# Patient Record
Sex: Female | Born: 1950 | Race: White | Hispanic: No | Marital: Married | Smoking: Current every day smoker
Health system: Southern US, Community
[De-identification: ages and names within clinical notes are randomized; demographics above are authoritative.]

## PROBLEM LIST (undated history)

## (undated) DIAGNOSIS — K589 Irritable bowel syndrome without diarrhea: Secondary | ICD-10-CM

## (undated) DIAGNOSIS — K559 Vascular disorder of intestine, unspecified: Secondary | ICD-10-CM

## (undated) DIAGNOSIS — T4145XA Adverse effect of unspecified anesthetic, initial encounter: Secondary | ICD-10-CM

## (undated) DIAGNOSIS — K222 Esophageal obstruction: Secondary | ICD-10-CM

## (undated) DIAGNOSIS — N912 Amenorrhea, unspecified: Secondary | ICD-10-CM

## (undated) DIAGNOSIS — I219 Acute myocardial infarction, unspecified: Secondary | ICD-10-CM

## (undated) DIAGNOSIS — K635 Polyp of colon: Secondary | ICD-10-CM

## (undated) DIAGNOSIS — M549 Dorsalgia, unspecified: Secondary | ICD-10-CM

## (undated) DIAGNOSIS — I2510 Atherosclerotic heart disease of native coronary artery without angina pectoris: Secondary | ICD-10-CM

## (undated) DIAGNOSIS — F32A Depression, unspecified: Secondary | ICD-10-CM

## (undated) DIAGNOSIS — K449 Diaphragmatic hernia without obstruction or gangrene: Secondary | ICD-10-CM

## (undated) DIAGNOSIS — G8929 Other chronic pain: Secondary | ICD-10-CM

## (undated) DIAGNOSIS — K219 Gastro-esophageal reflux disease without esophagitis: Secondary | ICD-10-CM

## (undated) DIAGNOSIS — M5430 Sciatica, unspecified side: Secondary | ICD-10-CM

## (undated) DIAGNOSIS — I471 Supraventricular tachycardia, unspecified: Secondary | ICD-10-CM

## (undated) DIAGNOSIS — F329 Major depressive disorder, single episode, unspecified: Secondary | ICD-10-CM

## (undated) DIAGNOSIS — R112 Nausea with vomiting, unspecified: Secondary | ICD-10-CM

## (undated) DIAGNOSIS — T7840XA Allergy, unspecified, initial encounter: Secondary | ICD-10-CM

## (undated) DIAGNOSIS — J449 Chronic obstructive pulmonary disease, unspecified: Secondary | ICD-10-CM

## (undated) DIAGNOSIS — T8859XA Other complications of anesthesia, initial encounter: Secondary | ICD-10-CM

## (undated) DIAGNOSIS — G629 Polyneuropathy, unspecified: Secondary | ICD-10-CM

## (undated) DIAGNOSIS — N189 Chronic kidney disease, unspecified: Secondary | ICD-10-CM

## (undated) DIAGNOSIS — N390 Urinary tract infection, site not specified: Secondary | ICD-10-CM

## (undated) DIAGNOSIS — M1990 Unspecified osteoarthritis, unspecified site: Secondary | ICD-10-CM

## (undated) DIAGNOSIS — Z9889 Other specified postprocedural states: Secondary | ICD-10-CM

## (undated) DIAGNOSIS — I639 Cerebral infarction, unspecified: Secondary | ICD-10-CM

## (undated) DIAGNOSIS — M797 Fibromyalgia: Secondary | ICD-10-CM

## (undated) DIAGNOSIS — E785 Hyperlipidemia, unspecified: Secondary | ICD-10-CM

## (undated) DIAGNOSIS — J189 Pneumonia, unspecified organism: Secondary | ICD-10-CM

## (undated) DIAGNOSIS — F419 Anxiety disorder, unspecified: Secondary | ICD-10-CM

## (undated) DIAGNOSIS — D3705 Neoplasm of uncertain behavior of pharynx: Secondary | ICD-10-CM

## (undated) DIAGNOSIS — I10 Essential (primary) hypertension: Secondary | ICD-10-CM

## (undated) HISTORY — PX: BACK SURGERY: SHX140

## (undated) HISTORY — DX: Anxiety disorder, unspecified: F41.9

## (undated) HISTORY — DX: Essential (primary) hypertension: I10

## (undated) HISTORY — DX: Depression, unspecified: F32.A

## (undated) HISTORY — DX: Irritable bowel syndrome, unspecified: K58.9

## (undated) HISTORY — DX: Neoplasm of uncertain behavior of pharynx: D37.05

## (undated) HISTORY — DX: Major depressive disorder, single episode, unspecified: F32.9

## (undated) HISTORY — DX: Acute myocardial infarction, unspecified: I21.9

## (undated) HISTORY — DX: Pneumonia, unspecified organism: J18.9

## (undated) HISTORY — DX: Vascular disorder of intestine, unspecified: K55.9

## (undated) HISTORY — PX: DILATION AND CURETTAGE OF UTERUS: SHX78

## (undated) HISTORY — PX: BREAST SURGERY: SHX581

## (undated) HISTORY — DX: Atherosclerotic heart disease of native coronary artery without angina pectoris: I25.10

## (undated) HISTORY — DX: Cerebral infarction, unspecified: I63.9

## (undated) HISTORY — DX: Chronic obstructive pulmonary disease, unspecified: J44.9

## (undated) HISTORY — DX: Diaphragmatic hernia without obstruction or gangrene: K44.9

## (undated) HISTORY — DX: Fibromyalgia: M79.7

## (undated) HISTORY — DX: Chronic kidney disease, unspecified: N18.9

## (undated) HISTORY — DX: Polyp of colon: K63.5

## (undated) HISTORY — DX: Esophageal obstruction: K22.2

## (undated) HISTORY — DX: Gastro-esophageal reflux disease without esophagitis: K21.9

## (undated) HISTORY — PX: OTHER SURGICAL HISTORY: SHX169

## (undated) HISTORY — PX: BLADDER SURGERY: SHX569

## (undated) HISTORY — DX: Allergy, unspecified, initial encounter: T78.40XA

## (undated) HISTORY — DX: Amenorrhea, unspecified: N91.2

## (undated) HISTORY — DX: Urinary tract infection, site not specified: N39.0

## (undated) HISTORY — PX: TUBAL LIGATION: SHX77

## (undated) HISTORY — PX: NECK SURGERY: SHX720

## (undated) HISTORY — DX: Unspecified osteoarthritis, unspecified site: M19.90

## (undated) HISTORY — PX: ABDOMINAL HYSTERECTOMY: SHX81

## (undated) HISTORY — DX: Hyperlipidemia, unspecified: E78.5

---

## 2001-06-05 ENCOUNTER — Encounter: Admission: RE | Admit: 2001-06-05 | Discharge: 2001-07-09 | Payer: Self-pay | Admitting: Family Medicine

## 2002-11-05 ENCOUNTER — Other Ambulatory Visit: Admission: RE | Admit: 2002-11-05 | Discharge: 2002-11-05 | Payer: Self-pay | Admitting: Family Medicine

## 2004-04-13 ENCOUNTER — Ambulatory Visit (HOSPITAL_COMMUNITY): Admission: RE | Admit: 2004-04-13 | Discharge: 2004-04-13 | Payer: Self-pay | Admitting: Family Medicine

## 2005-03-05 ENCOUNTER — Emergency Department (HOSPITAL_COMMUNITY): Admission: EM | Admit: 2005-03-05 | Discharge: 2005-03-05 | Payer: Self-pay | Admitting: Emergency Medicine

## 2005-09-10 HISTORY — PX: COLONOSCOPY: SHX174

## 2006-02-12 ENCOUNTER — Encounter (INDEPENDENT_AMBULATORY_CARE_PROVIDER_SITE_OTHER): Payer: Self-pay | Admitting: Family Medicine

## 2006-11-08 ENCOUNTER — Inpatient Hospital Stay (HOSPITAL_COMMUNITY): Admission: EM | Admit: 2006-11-08 | Discharge: 2006-11-11 | Payer: Self-pay | Admitting: Emergency Medicine

## 2006-11-09 ENCOUNTER — Encounter: Payer: Self-pay | Admitting: Internal Medicine

## 2006-11-09 ENCOUNTER — Ambulatory Visit: Payer: Self-pay | Admitting: Internal Medicine

## 2006-11-10 ENCOUNTER — Ambulatory Visit: Payer: Self-pay | Admitting: Gastroenterology

## 2006-12-04 ENCOUNTER — Ambulatory Visit: Payer: Self-pay | Admitting: Internal Medicine

## 2007-01-14 ENCOUNTER — Encounter (INDEPENDENT_AMBULATORY_CARE_PROVIDER_SITE_OTHER): Payer: Self-pay | Admitting: Family Medicine

## 2007-03-04 ENCOUNTER — Ambulatory Visit (HOSPITAL_COMMUNITY)
Admission: RE | Admit: 2007-03-04 | Discharge: 2007-03-04 | Payer: Self-pay | Admitting: Physical Medicine and Rehabilitation

## 2007-08-01 ENCOUNTER — Ambulatory Visit: Payer: Self-pay | Admitting: Family Medicine

## 2007-08-01 DIAGNOSIS — F341 Dysthymic disorder: Secondary | ICD-10-CM | POA: Insufficient documentation

## 2007-08-01 DIAGNOSIS — F17200 Nicotine dependence, unspecified, uncomplicated: Secondary | ICD-10-CM | POA: Insufficient documentation

## 2007-08-01 DIAGNOSIS — K559 Vascular disorder of intestine, unspecified: Secondary | ICD-10-CM | POA: Insufficient documentation

## 2007-08-01 DIAGNOSIS — M5137 Other intervertebral disc degeneration, lumbosacral region: Secondary | ICD-10-CM | POA: Insufficient documentation

## 2007-08-01 LAB — CONVERTED CEMR LAB
Bacteria, UA: NONE SEEN
RBC / HPF: NONE SEEN (ref <3)
WBC, UA: NONE SEEN cells/hpf (ref <3)

## 2007-08-02 ENCOUNTER — Encounter (INDEPENDENT_AMBULATORY_CARE_PROVIDER_SITE_OTHER): Payer: Self-pay | Admitting: Family Medicine

## 2007-08-06 ENCOUNTER — Telehealth (INDEPENDENT_AMBULATORY_CARE_PROVIDER_SITE_OTHER): Payer: Self-pay | Admitting: *Deleted

## 2007-08-15 ENCOUNTER — Telehealth (INDEPENDENT_AMBULATORY_CARE_PROVIDER_SITE_OTHER): Payer: Self-pay | Admitting: Family Medicine

## 2007-08-15 ENCOUNTER — Ambulatory Visit: Payer: Self-pay | Admitting: Family Medicine

## 2007-08-15 DIAGNOSIS — N8111 Cystocele, midline: Secondary | ICD-10-CM | POA: Insufficient documentation

## 2007-08-16 ENCOUNTER — Encounter (INDEPENDENT_AMBULATORY_CARE_PROVIDER_SITE_OTHER): Payer: Self-pay | Admitting: Family Medicine

## 2007-08-20 ENCOUNTER — Telehealth (INDEPENDENT_AMBULATORY_CARE_PROVIDER_SITE_OTHER): Payer: Self-pay | Admitting: Family Medicine

## 2007-08-20 LAB — CONVERTED CEMR LAB
ALT: 17 units/L (ref 0–35)
AST: 17 units/L (ref 0–37)
Basophils Absolute: 0 10*3/uL (ref 0.0–0.1)
Basophils Relative: 0 % (ref 0–1)
Calcium: 9.5 mg/dL (ref 8.4–10.5)
Chloride: 107 meq/L (ref 96–112)
Creatinine, Ser: 0.71 mg/dL (ref 0.40–1.20)
Lymphocytes Relative: 25 % (ref 12–46)
MCHC: 32.2 g/dL (ref 30.0–36.0)
Monocytes Absolute: 0.6 10*3/uL (ref 0.1–1.0)
Neutro Abs: 5.6 10*3/uL (ref 1.7–7.7)
Neutrophils Relative %: 67 % (ref 43–77)
Platelets: 271 10*3/uL (ref 150–400)
Potassium: 4.5 meq/L (ref 3.5–5.3)
RDW: 13.9 % (ref 11.5–15.5)
Sodium: 141 meq/L (ref 135–145)
Total Protein: 6.9 g/dL (ref 6.0–8.3)

## 2007-08-22 ENCOUNTER — Telehealth (INDEPENDENT_AMBULATORY_CARE_PROVIDER_SITE_OTHER): Payer: Self-pay | Admitting: *Deleted

## 2007-08-27 ENCOUNTER — Emergency Department (HOSPITAL_COMMUNITY): Admission: EM | Admit: 2007-08-27 | Discharge: 2007-08-27 | Payer: Self-pay | Admitting: Emergency Medicine

## 2007-08-27 ENCOUNTER — Telehealth (INDEPENDENT_AMBULATORY_CARE_PROVIDER_SITE_OTHER): Payer: Self-pay | Admitting: *Deleted

## 2007-09-12 ENCOUNTER — Encounter (INDEPENDENT_AMBULATORY_CARE_PROVIDER_SITE_OTHER): Payer: Self-pay | Admitting: Family Medicine

## 2007-10-02 ENCOUNTER — Ambulatory Visit: Payer: Self-pay | Admitting: Family Medicine

## 2007-10-03 ENCOUNTER — Ambulatory Visit: Payer: Self-pay | Admitting: Family Medicine

## 2007-10-03 DIAGNOSIS — G2581 Restless legs syndrome: Secondary | ICD-10-CM | POA: Insufficient documentation

## 2007-10-03 DIAGNOSIS — K5900 Constipation, unspecified: Secondary | ICD-10-CM | POA: Insufficient documentation

## 2007-10-03 DIAGNOSIS — N951 Menopausal and female climacteric states: Secondary | ICD-10-CM | POA: Insufficient documentation

## 2007-10-05 ENCOUNTER — Encounter (INDEPENDENT_AMBULATORY_CARE_PROVIDER_SITE_OTHER): Payer: Self-pay | Admitting: Family Medicine

## 2007-10-09 ENCOUNTER — Encounter (INDEPENDENT_AMBULATORY_CARE_PROVIDER_SITE_OTHER): Payer: Self-pay | Admitting: Family Medicine

## 2007-10-10 ENCOUNTER — Ambulatory Visit: Payer: Self-pay | Admitting: Family Medicine

## 2007-10-29 ENCOUNTER — Ambulatory Visit: Payer: Self-pay | Admitting: Family Medicine

## 2007-11-27 ENCOUNTER — Ambulatory Visit: Payer: Self-pay | Admitting: Family Medicine

## 2007-11-27 DIAGNOSIS — R519 Headache, unspecified: Secondary | ICD-10-CM | POA: Insufficient documentation

## 2007-11-27 DIAGNOSIS — R51 Headache: Secondary | ICD-10-CM | POA: Insufficient documentation

## 2007-12-25 ENCOUNTER — Encounter (INDEPENDENT_AMBULATORY_CARE_PROVIDER_SITE_OTHER): Payer: Self-pay | Admitting: Family Medicine

## 2007-12-26 ENCOUNTER — Ambulatory Visit: Payer: Self-pay | Admitting: Family Medicine

## 2007-12-26 DIAGNOSIS — J309 Allergic rhinitis, unspecified: Secondary | ICD-10-CM | POA: Insufficient documentation

## 2007-12-31 ENCOUNTER — Encounter (INDEPENDENT_AMBULATORY_CARE_PROVIDER_SITE_OTHER): Payer: Self-pay | Admitting: Family Medicine

## 2008-01-03 ENCOUNTER — Telehealth (INDEPENDENT_AMBULATORY_CARE_PROVIDER_SITE_OTHER): Payer: Self-pay | Admitting: *Deleted

## 2008-01-06 ENCOUNTER — Encounter (INDEPENDENT_AMBULATORY_CARE_PROVIDER_SITE_OTHER): Payer: Self-pay | Admitting: Family Medicine

## 2008-01-08 ENCOUNTER — Ambulatory Visit: Payer: Self-pay | Admitting: Family Medicine

## 2008-01-09 ENCOUNTER — Encounter (INDEPENDENT_AMBULATORY_CARE_PROVIDER_SITE_OTHER): Payer: Self-pay | Admitting: Family Medicine

## 2008-01-11 ENCOUNTER — Emergency Department (HOSPITAL_COMMUNITY): Admission: EM | Admit: 2008-01-11 | Discharge: 2008-01-11 | Payer: Self-pay | Admitting: Emergency Medicine

## 2008-01-16 ENCOUNTER — Emergency Department (HOSPITAL_COMMUNITY): Admission: EM | Admit: 2008-01-16 | Discharge: 2008-01-16 | Payer: Self-pay | Admitting: Emergency Medicine

## 2008-01-19 ENCOUNTER — Telehealth: Payer: Self-pay | Admitting: Family Medicine

## 2008-01-20 ENCOUNTER — Encounter (INDEPENDENT_AMBULATORY_CARE_PROVIDER_SITE_OTHER): Payer: Self-pay | Admitting: Family Medicine

## 2008-01-22 ENCOUNTER — Ambulatory Visit: Payer: Self-pay | Admitting: Family Medicine

## 2008-02-18 ENCOUNTER — Telehealth (INDEPENDENT_AMBULATORY_CARE_PROVIDER_SITE_OTHER): Payer: Self-pay | Admitting: *Deleted

## 2008-02-24 ENCOUNTER — Ambulatory Visit: Payer: Self-pay | Admitting: Family Medicine

## 2008-03-06 ENCOUNTER — Emergency Department (HOSPITAL_COMMUNITY): Admission: EM | Admit: 2008-03-06 | Discharge: 2008-03-06 | Payer: Self-pay | Admitting: Emergency Medicine

## 2008-03-09 ENCOUNTER — Ambulatory Visit: Payer: Self-pay | Admitting: Family Medicine

## 2008-03-09 LAB — CONVERTED CEMR LAB
Nitrite: NEGATIVE
Protein, U semiquant: NEGATIVE
Urobilinogen, UA: 0.2

## 2008-03-10 ENCOUNTER — Encounter (INDEPENDENT_AMBULATORY_CARE_PROVIDER_SITE_OTHER): Payer: Self-pay | Admitting: Family Medicine

## 2008-03-11 LAB — CONVERTED CEMR LAB
Ketones, ur: NEGATIVE mg/dL
Leukocytes, UA: NEGATIVE
Nitrite: NEGATIVE
Specific Gravity, Urine: 1.016 (ref 1.005–1.03)
pH: 6 (ref 5.0–8.0)

## 2008-03-20 ENCOUNTER — Telehealth (INDEPENDENT_AMBULATORY_CARE_PROVIDER_SITE_OTHER): Payer: Self-pay | Admitting: *Deleted

## 2008-03-24 ENCOUNTER — Ambulatory Visit (HOSPITAL_COMMUNITY): Admission: RE | Admit: 2008-03-24 | Discharge: 2008-03-24 | Payer: Self-pay | Admitting: Family Medicine

## 2008-03-24 ENCOUNTER — Ambulatory Visit: Payer: Self-pay | Admitting: Family Medicine

## 2008-05-13 ENCOUNTER — Telehealth (INDEPENDENT_AMBULATORY_CARE_PROVIDER_SITE_OTHER): Payer: Self-pay | Admitting: Family Medicine

## 2008-05-13 ENCOUNTER — Encounter (INDEPENDENT_AMBULATORY_CARE_PROVIDER_SITE_OTHER): Payer: Self-pay | Admitting: Family Medicine

## 2008-05-18 ENCOUNTER — Ambulatory Visit: Payer: Self-pay | Admitting: Family Medicine

## 2008-05-18 ENCOUNTER — Ambulatory Visit (HOSPITAL_COMMUNITY): Admission: RE | Admit: 2008-05-18 | Discharge: 2008-05-18 | Payer: Self-pay | Admitting: Family Medicine

## 2008-05-18 LAB — CONVERTED CEMR LAB
Nitrite: NEGATIVE
Protein, U semiquant: NEGATIVE
Urobilinogen, UA: 0.2
pH: 7

## 2008-05-19 ENCOUNTER — Encounter (INDEPENDENT_AMBULATORY_CARE_PROVIDER_SITE_OTHER): Payer: Self-pay | Admitting: Family Medicine

## 2008-05-19 LAB — CONVERTED CEMR LAB
Amphetamine Screen, Ur: NEGATIVE
Barbiturate Quant, Ur: NEGATIVE
Marijuana Metabolite: NEGATIVE
Methadone: NEGATIVE
Opiate Screen, Urine: NEGATIVE

## 2008-05-20 ENCOUNTER — Telehealth (INDEPENDENT_AMBULATORY_CARE_PROVIDER_SITE_OTHER): Payer: Self-pay | Admitting: *Deleted

## 2008-05-26 ENCOUNTER — Telehealth (INDEPENDENT_AMBULATORY_CARE_PROVIDER_SITE_OTHER): Payer: Self-pay | Admitting: *Deleted

## 2008-06-01 ENCOUNTER — Ambulatory Visit: Payer: Self-pay | Admitting: Family Medicine

## 2008-06-04 ENCOUNTER — Encounter: Admission: RE | Admit: 2008-06-04 | Discharge: 2008-06-04 | Payer: Self-pay | Admitting: Family Medicine

## 2008-06-08 ENCOUNTER — Encounter (INDEPENDENT_AMBULATORY_CARE_PROVIDER_SITE_OTHER): Payer: Self-pay | Admitting: Family Medicine

## 2008-06-09 ENCOUNTER — Encounter (INDEPENDENT_AMBULATORY_CARE_PROVIDER_SITE_OTHER): Payer: Self-pay | Admitting: Family Medicine

## 2008-06-11 ENCOUNTER — Encounter (INDEPENDENT_AMBULATORY_CARE_PROVIDER_SITE_OTHER): Payer: Self-pay | Admitting: Family Medicine

## 2008-07-24 ENCOUNTER — Emergency Department (HOSPITAL_COMMUNITY): Admission: EM | Admit: 2008-07-24 | Discharge: 2008-07-24 | Payer: Self-pay | Admitting: Emergency Medicine

## 2008-07-27 ENCOUNTER — Telehealth (INDEPENDENT_AMBULATORY_CARE_PROVIDER_SITE_OTHER): Payer: Self-pay | Admitting: Family Medicine

## 2008-07-30 ENCOUNTER — Ambulatory Visit: Payer: Self-pay | Admitting: Family Medicine

## 2008-08-11 ENCOUNTER — Emergency Department (HOSPITAL_COMMUNITY): Admission: EM | Admit: 2008-08-11 | Discharge: 2008-08-11 | Payer: Self-pay | Admitting: Emergency Medicine

## 2008-08-17 ENCOUNTER — Encounter (INDEPENDENT_AMBULATORY_CARE_PROVIDER_SITE_OTHER): Payer: Self-pay | Admitting: Family Medicine

## 2008-08-17 ENCOUNTER — Telehealth (INDEPENDENT_AMBULATORY_CARE_PROVIDER_SITE_OTHER): Payer: Self-pay | Admitting: *Deleted

## 2008-08-27 ENCOUNTER — Ambulatory Visit: Payer: Self-pay | Admitting: Family Medicine

## 2008-08-28 ENCOUNTER — Encounter (INDEPENDENT_AMBULATORY_CARE_PROVIDER_SITE_OTHER): Payer: Self-pay | Admitting: Family Medicine

## 2008-08-31 ENCOUNTER — Encounter (INDEPENDENT_AMBULATORY_CARE_PROVIDER_SITE_OTHER): Payer: Self-pay | Admitting: *Deleted

## 2008-08-31 LAB — CONVERTED CEMR LAB
Albumin: 4.3 g/dL (ref 3.5–5.2)
Basophils Relative: 1 % (ref 0–1)
CO2: 25 meq/L (ref 19–32)
Calcium: 10.3 mg/dL (ref 8.4–10.5)
Cholesterol: 235 mg/dL — ABNORMAL HIGH (ref 0–200)
Glucose, Bld: 87 mg/dL (ref 70–99)
Hemoglobin: 14.3 g/dL (ref 12.0–15.0)
MCHC: 32.9 g/dL (ref 30.0–36.0)
MCV: 96.2 fL (ref 78.0–100.0)
Monocytes Absolute: 0.8 10*3/uL (ref 0.1–1.0)
Monocytes Relative: 10 % (ref 3–12)
Neutro Abs: 4.2 10*3/uL (ref 1.7–7.7)
RBC: 4.52 M/uL (ref 3.87–5.11)
Sodium: 141 meq/L (ref 135–145)
Total Bilirubin: 0.3 mg/dL (ref 0.3–1.2)
Total Protein: 6.8 g/dL (ref 6.0–8.3)
Triglycerides: 147 mg/dL (ref <150)
VLDL: 29 mg/dL (ref 0–40)

## 2008-09-11 ENCOUNTER — Telehealth (INDEPENDENT_AMBULATORY_CARE_PROVIDER_SITE_OTHER): Payer: Self-pay | Admitting: Family Medicine

## 2008-09-11 ENCOUNTER — Emergency Department (HOSPITAL_COMMUNITY): Admission: EM | Admit: 2008-09-11 | Discharge: 2008-09-11 | Payer: Self-pay | Admitting: Emergency Medicine

## 2008-09-11 ENCOUNTER — Encounter (INDEPENDENT_AMBULATORY_CARE_PROVIDER_SITE_OTHER): Payer: Self-pay | Admitting: Family Medicine

## 2008-09-15 ENCOUNTER — Telehealth (INDEPENDENT_AMBULATORY_CARE_PROVIDER_SITE_OTHER): Payer: Self-pay | Admitting: *Deleted

## 2008-10-14 ENCOUNTER — Telehealth (INDEPENDENT_AMBULATORY_CARE_PROVIDER_SITE_OTHER): Payer: Self-pay | Admitting: *Deleted

## 2008-10-15 ENCOUNTER — Encounter: Payer: Self-pay | Admitting: Family Medicine

## 2008-10-21 ENCOUNTER — Telehealth (INDEPENDENT_AMBULATORY_CARE_PROVIDER_SITE_OTHER): Payer: Self-pay | Admitting: *Deleted

## 2008-10-26 ENCOUNTER — Encounter: Admission: RE | Admit: 2008-10-26 | Discharge: 2008-10-26 | Payer: Self-pay | Admitting: Family Medicine

## 2008-10-30 ENCOUNTER — Ambulatory Visit: Payer: Self-pay | Admitting: Family Medicine

## 2008-10-30 DIAGNOSIS — E785 Hyperlipidemia, unspecified: Secondary | ICD-10-CM | POA: Insufficient documentation

## 2008-11-09 ENCOUNTER — Encounter (INDEPENDENT_AMBULATORY_CARE_PROVIDER_SITE_OTHER): Payer: Self-pay | Admitting: Family Medicine

## 2008-11-20 ENCOUNTER — Ambulatory Visit: Payer: Self-pay | Admitting: Family Medicine

## 2008-11-20 DIAGNOSIS — R300 Dysuria: Secondary | ICD-10-CM | POA: Insufficient documentation

## 2008-11-20 LAB — CONVERTED CEMR LAB
Bilirubin Urine: NEGATIVE
Blood in Urine, dipstick: NEGATIVE
Ketones, urine, test strip: NEGATIVE
Protein, U semiquant: 30
Urobilinogen, UA: NEGATIVE

## 2008-11-23 ENCOUNTER — Encounter (INDEPENDENT_AMBULATORY_CARE_PROVIDER_SITE_OTHER): Payer: Self-pay | Admitting: Family Medicine

## 2008-12-24 ENCOUNTER — Encounter (INDEPENDENT_AMBULATORY_CARE_PROVIDER_SITE_OTHER): Payer: Self-pay | Admitting: Family Medicine

## 2008-12-25 ENCOUNTER — Emergency Department (HOSPITAL_COMMUNITY): Admission: EM | Admit: 2008-12-25 | Discharge: 2008-12-26 | Payer: Self-pay | Admitting: Emergency Medicine

## 2008-12-26 ENCOUNTER — Emergency Department (HOSPITAL_COMMUNITY): Admission: EM | Admit: 2008-12-26 | Discharge: 2008-12-26 | Payer: Self-pay | Admitting: Emergency Medicine

## 2008-12-26 ENCOUNTER — Encounter (INDEPENDENT_AMBULATORY_CARE_PROVIDER_SITE_OTHER): Payer: Self-pay | Admitting: Family Medicine

## 2009-01-08 ENCOUNTER — Encounter (INDEPENDENT_AMBULATORY_CARE_PROVIDER_SITE_OTHER): Payer: Self-pay | Admitting: Family Medicine

## 2009-03-23 ENCOUNTER — Ambulatory Visit (HOSPITAL_COMMUNITY): Admission: RE | Admit: 2009-03-23 | Discharge: 2009-03-23 | Payer: Self-pay | Admitting: Obstetrics and Gynecology

## 2009-05-02 ENCOUNTER — Emergency Department (HOSPITAL_COMMUNITY): Admission: EM | Admit: 2009-05-02 | Discharge: 2009-05-03 | Payer: Self-pay | Admitting: Emergency Medicine

## 2010-01-19 ENCOUNTER — Emergency Department (HOSPITAL_COMMUNITY): Admission: EM | Admit: 2010-01-19 | Discharge: 2010-01-19 | Payer: Self-pay | Admitting: Emergency Medicine

## 2010-02-24 ENCOUNTER — Ambulatory Visit: Payer: Self-pay | Admitting: Family Medicine

## 2010-02-24 DIAGNOSIS — IMO0002 Reserved for concepts with insufficient information to code with codable children: Secondary | ICD-10-CM | POA: Insufficient documentation

## 2010-02-24 DIAGNOSIS — R319 Hematuria, unspecified: Secondary | ICD-10-CM | POA: Insufficient documentation

## 2010-02-24 DIAGNOSIS — L918 Other hypertrophic disorders of the skin: Secondary | ICD-10-CM

## 2010-02-24 DIAGNOSIS — L908 Other atrophic disorders of skin: Secondary | ICD-10-CM | POA: Insufficient documentation

## 2010-02-24 LAB — CONVERTED CEMR LAB
Bilirubin Urine: NEGATIVE
Glucose, Urine, Semiquant: NEGATIVE
Ketones, urine, test strip: NEGATIVE
Protein, U semiquant: NEGATIVE
Specific Gravity, Urine: 1.025
pH: 7

## 2010-02-25 ENCOUNTER — Encounter: Payer: Self-pay | Admitting: Family Medicine

## 2010-03-17 ENCOUNTER — Encounter: Payer: Self-pay | Admitting: Family Medicine

## 2010-03-17 ENCOUNTER — Ambulatory Visit: Payer: Self-pay | Admitting: Family Medicine

## 2010-03-17 DIAGNOSIS — IMO0002 Reserved for concepts with insufficient information to code with codable children: Secondary | ICD-10-CM | POA: Insufficient documentation

## 2010-03-17 LAB — CONVERTED CEMR LAB
BUN: 11 mg/dL
CO2: 28 meq/L
Calcium: 9.9 mg/dL
Chloride: 104 meq/L
Creatinine, Ser: 0.69 mg/dL
Glucose, Bld: 78 mg/dL
Potassium: 4.2 meq/L
Sodium: 139 meq/L

## 2010-03-22 ENCOUNTER — Encounter: Payer: Self-pay | Admitting: Family Medicine

## 2010-04-20 ENCOUNTER — Encounter: Payer: Self-pay | Admitting: Family Medicine

## 2010-05-02 ENCOUNTER — Encounter: Payer: Self-pay | Admitting: Family Medicine

## 2010-05-10 NOTE — Assessment & Plan Note (Signed)
Summary: NP, tcb   Vital Signs:  Patient profile:   60 year old female Height:      66.75 inches Weight:      151.5 pounds BMI:     23.99 Temp:     98.2 degrees F oral Pulse rate:   69 / minute BP sitting:   136 / 74  (left arm) Cuff size:   regular  Vitals Entered By: Levert Feinstein LPN (February 24, 8118 4:44 PM) CC: New Patient Is Patient Diabetic? No Pain Assessment Patient in pain? yes     Location: lower back   Primary Care Provider:  Weston Settle, MD  CC:  New Patient.  History of Present Illness: NP here to meet new doctor.  Main concern is vaginal pain.  60 yo with h/o vaginal hysterectomy for fibroids also with cystocele and rectocele repair and additional surgery for adhesions reports that she has vaginal pain and dyparunia. Pain worse with initiation of intercourse (like an irritation or burning), but also pain with deep penetration.  This is causing problems with her husband.   Also with concerns of cystocele returning because she feels a cyst or bump after intercourse.  +Constant back pain and feeling something will fall out of her vagina if she works a lot.    Also with blood in urine and recent urgent care visit where she was treated for vaginosis.  Flagyl the irritaion and burning she feels after intercourse, but when she stopped, it got worse.  Denies discharge.    Pt has been treated with many different treatments, including premarin cream, which helps somewhat, and clobetasol, which she uses when things flare up.  She has noted skin changes in the genital area, which concern her.  These have been followed by other physicians in the past.  Has had no doctor for more than 1 year.  Reports she has not had pain meds for 1 year.  [facility].  Gets Xanax and Zoloft from them.  do not have counselors there anymore.  They are tying to get her off the Xanax.  Feels counseling would be helpful.  Has seen a counselor in the past (has been in  therapy for 25 years).  Wants to get back into counseling.      Habits & Providers  Alcohol-Tobacco-Diet     Alcohol drinks/day: 0     Tobacco Status: current     Tobacco Counseling: to quit use of tobacco products     Cigarette Packs/Day: 1/2 - 1     Year Started: Age 60 - failed Wellbutrin, has Chantix at home  Exercise-Depression-Behavior     Does Patient Exercise: yes     Type of exercise: walking     Times/week: 3     STD Risk: never     Drug Use: never     Seat Belt Use: always     Sun Exposure: frequent  Current Medications (verified): 1)  Zoloft 50 Mg  Tabs (Sertraline Hcl) .... Two Times A Day - Dr.deckle 2)  Premarin 0.625 Mg/gm Crea (Estrogens, Conjugated) .... As Directed Per Dr. Elonda Husky and Dr. Glo Herring 3)  Clobetasol Propionate 0.05 % Oint (Clobetasol Propionate) .... Apply Twice Daily For One Month, Then Once Daily For One Month, Then Twice A Week 4)  Alprazolam 0.5 Mg Tabs (Alprazolam) .Marland Kitchen.. 1 Three Times A Day  Allergies: 1)  ! Penicillin V Potassium (Penicillin V Potassium) 2)  ! Keflex (Cephalexin) 3)  ! Wellbutrin (Bupropion Hcl)  Past History:  Past Surgical History: TAH - done 1990s due to fibroids Pilonoidal cyst at age 60 Cyst right neck - benign per hx Abdominal adhesions, cystocele and rectocele repair  Family History: Mom 06-25-94 - Doing well Dad - Dead - Lung Cancer - AGe 47 Brothers: 2 x 60 and 79 - Healthy Sisters - None KIds - Boys 1 - 12 and Girl  1 67 - healthy exccpet for scoliosis Heart disease and stroke in first degree relatives  Social History: Does Patient Exercise:  yes STD Risk:  never Drug Use:  never Therapist, art Use:  always Sun Exposure-Excessive:  frequent  Review of Systems       see HPI  Physical Exam  General:  Well-developed,well-nourished,in no acute distress; alert,appropriate and cooperative throughout examination Genitalia:  + hypopigmented changes without fissures or bleeding noted.  Atrophic vaginal mucosa.   No pain with insertion of speculum.  + small ant. vaginal wall bulge.  Scant vaginal discharge.  Cervix absent.  Bimanual: + suprapubic pain with palpation.  Psych:  Normal grooming and appropriate dress.  Nl thought process and content without FOI or LOA.  Nl affect.  Non labile.     Impression & Recommendations:  Problem # 1:  OTHER SPEC HYPERTROPHIC&ATROPHIC CONDITION SKIN (ICD-701.8) Pt with changes consistent with lichen slcerosis.  Only using clobetalol as needed.  Suggested using more frequently (see pt instructions).  Will evaluate in 3 months, but if pt having problems before then, she is welcome to follow up sooner.  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  Will treat her skin condition, but if she is interested in having the cystocele treated, consider referral to gyn.  Problem # 3:  TOBACCO ABUSE (ICD-305.1) Pt aware she should quit.  Problem # 4:  HEMATURIA UNSPECIFIED (ICD-599.70) Will check cx.  If no evidence of infection, refer to urology.    Complete Medication List: 1)  Zoloft 50 Mg Tabs (Sertraline hcl) .... Two times a day - dr.deckle 2)  Premarin 0.625 Mg/gm Crea (Estrogens, conjugated) .... As directed per dr. Elonda Husky and dr. Glo Herring 3)  Clobetasol Propionate 0.05 % Oint (Clobetasol propionate) .... Apply twice daily for one month, then once daily for one month, then twice a week 4)  Alprazolam 0.5 Mg Tabs (Alprazolam) .Marland Kitchen.. 1 three times a day  Other Orders: GC/Chlamydia-[facility] (87591/87491) [facility]inalysis[facility] (00000) [facility] Urine Culture-[facility] ([id number])  Patient Instructions: 1)  It was nice to meet you today. 2)  You can try [facility] for counseling.  Their number is [phone]. 3)  Try the cream as follows:  Apply twice a day for one month, then decrease to once a day for one month, then use 2 times a week for one month.   4)  Please make an appt to  see me again in 3 months to follow up on this and see if the cream is working.  If you would like to see me sooner, please feel free to make an earlier appointment. Prescriptions: CLOBETASOL PROPIONATE 0.05 % OINT (CLOBETASOL PROPIONATE) Apply twice daily for one month, then once daily for one month, then twice a week  #1 large tube x 1   Entered and Authorized by:   Sarah Martinique MD   Signed by:   Sarah Martinique MD on 02/24/2010   Method used:   Print then Give to Patient   RxID:  [id number]    Orders Added: 1)  GC/Chlamydia[facility] [87591/87491] 2)  Urinalysis-[facility] [00000] 3)  Wet Prep- [facility] [87210] 4)  Urine Culture-[facility] [[id number]] 5)  [facility] [99203]    Laboratory Results   Urine Tests  Date/Time Received: February 24, 2010 5:35 PM  Date/Time Reported: February 24, 2010 6:00 PM   Routine Urinalysis   Color: yellow Appearance: Clear Glucose: negative   (Normal Range: Negative) Bilirubin: negative   (Normal Range: Negative) Ketone: negative   (Normal Range: Negative) Spec. Gravity: 1.025   (Normal Range: 1.003-1.035) Blood: moderate   (Normal Range: Negative) pH: 7.0   (Normal Range: 5.0-8.0) Protein: negative   (Normal Range: Negative) Urobilinogen: 0.2   (Normal Range: 0-1) Nitrite: negative   (Normal Range: Negative) Leukocyte Esterace: negative   (Normal Range: Negative)  Urine Microscopic RBC/HPF: 15-25 Bacteria/HPF: 1+ Epithelial/HPF: 1-5    Comments: urine sent for culture ...............test performed by......Marland KitchenBonnie A. Martinique, MLS (ASCP)cm  Date/Time Received: February 24, 2010 5:45 PM  Date/Time Reported: February 24, 2010 6:01 PM    [employer] Source: vag WBC/hpf: rare Bacteria/hpf: 2+  Rods Clue cells/hpf: none  Negative whiff Yeast/hpf: none Trichomonas/hpf: none Comments: ...............test performed by......Marland KitchenBonnie A. Martinique, MLS (ASCP)cm

## 2010-05-12 NOTE — Letter (Signed)
Summary: normal lab results  [facility]  [address]   [place], [place] [address]   Phone: [phone]  Fax: [phone]    03/22/2010  Rebecca [name] [name], [name]  [address]  Dear [name],   I am happy to let you know that the lab work we did at our visit came back normal.  Please let us know if you have any questions.   Sincerely,   Sarah Martinique MD  Appended Document: normal lab results mailed

## 2010-05-12 NOTE — Miscellaneous (Signed)
Summary: rx for pt assistance program nasonex, proair and zoloft  Clinical Lists Changes Received paperwork from [facility] Dept for prescriptions for Zoloft, Proair and Nasonex.  Forms completed, will mail back to [facility], Mary Sella, Clinical research associate.  [phone].  Medications: Added new medication of PROAIR HFA 108 (90 BASE) MCG/ACT AERS (ALBUTEROL SULFATE) 1 puff q 6 hours as needed shortness of breath. - Signed Added new medication of NASONEX 50 MCG/ACT SUSP (MOMETASONE FUROATE) 2 sprays q nostril daily - Signed Rx of PROAIR HFA 108 (90 BASE) MCG/ACT AERS (ALBUTEROL SULFATE) 1 puff q 6 hours as needed shortness of breath.;  #1 x 3;  Signed;  Entered by: Sarah Martinique MD;  Authorized by: Sarah Martinique MD;  Method used: Historical Rx of NASONEX 50 MCG/ACT SUSP (MOMETASONE FUROATE) 2 sprays q nostril daily;  #1 x 3;  Signed;  Entered by: Sarah Martinique MD;  Authorized by: Sarah Martinique MD;  Method used: Historical    Prescriptions: NASONEX 50 MCG/ACT SUSP (MOMETASONE FUROATE) 2 sprays q nostril daily  #1 x 3   Entered and Authorized by:   Sarah Martinique MD   Signed by:   Sarah Martinique MD on 04/20/2010   Method used:   Historical   RxID:   [id number] PROAIR HFA 108 (90 BASE) MCG/ACT AERS (ALBUTEROL SULFATE) 1 puff q 6 hours as needed shortness of breath.  #1 x 3   Entered and Authorized by:   Sarah Martinique MD   Signed by:   Sarah Martinique MD on 04/20/2010   Method used:   Historical   RxID:   [id number]
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
## 2010-06-20 ENCOUNTER — Other Ambulatory Visit: Payer: Self-pay | Admitting: Family Medicine

## 2010-06-20 MED ORDER — ALBUTEROL SULFATE HFA 108 (90 BASE) MCG/ACT IN AERS: 2.0000 | INHALATION_SPRAY | Freq: Four times a day (QID) | RESPIRATORY_TRACT | Status: DC | PRN

## 2010-06-23 LAB — BASIC METABOLIC PANEL
BUN: 7 mg/dL (ref 6–23)
CO2: 29 mEq/L (ref 19–32)
Calcium: 9.5 mg/dL (ref 8.4–10.5)
Chloride: 105 mEq/L (ref 96–112)
Creatinine, Ser: 0.7 mg/dL (ref 0.4–1.2)
GFR calc Af Amer: >60 mL/min (ref >60)
GFR calc non Af Amer: >60 mL/min (ref >60)
Glucose, Bld: 80 mg/dL (ref 70–99)
Potassium: 3.8 mEq/L (ref 3.5–5.1)
Sodium: 140 mEq/L (ref 135–145)

## 2010-06-23 LAB — URINALYSIS, ROUTINE W REFLEX MICROSCOPIC
Bilirubin Urine: NEGATIVE
Ketones, ur: NEGATIVE mg/dL
Nitrite: NEGATIVE
pH: 6.5 (ref 5.0–8.0)

## 2010-06-23 LAB — CBC
Hemoglobin: 14.3 g/dL (ref 12.0–15.0)
MCH: 32.3 pg (ref 26.0–34.0)
Platelets: 225 10*3/uL (ref 150–400)
RBC: 4.43 MIL/uL (ref 3.87–5.11)
WBC: 8 10*3/uL (ref 4.0–10.5)

## 2010-06-23 LAB — URINE CULTURE: Culture  Setup Time: 201110130122

## 2010-06-23 LAB — DIFFERENTIAL
Basophils Absolute: 0.1 10*3/uL (ref 0.0–0.1)
Basophils Relative: 1 % (ref 0–1)
Eosinophils Absolute: 0.3 10*3/uL (ref 0.0–0.7)
Eosinophils Relative: 4 % (ref 0–5)
Lymphocytes Relative: 34 % (ref 12–46)
Lymphs Abs: 2.7 10*3/uL (ref 0.7–4.0)
Monocytes Absolute: 0.7 10*3/uL (ref 0.1–1.0)
Monocytes Relative: 9 % (ref 3–12)
Neutro Abs: 4.1 10*3/uL (ref 1.7–7.7)
Neutrophils Relative %: 51 % (ref 43–77)

## 2010-06-23 LAB — WET PREP, GENITAL
Trich, Wet Prep: NONE SEEN
Yeast Wet Prep HPF POC: NONE SEEN

## 2010-06-23 LAB — URINE MICROSCOPIC-ADD ON

## 2010-06-23 LAB — GC/CHLAMYDIA PROBE AMP, GENITAL
Chlamydia, DNA Probe: NEGATIVE
GC Probe Amp, Genital: NEGATIVE

## 2010-07-12 ENCOUNTER — Emergency Department (HOSPITAL_COMMUNITY): Payer: PRIVATE HEALTH INSURANCE

## 2010-07-12 ENCOUNTER — Emergency Department (HOSPITAL_COMMUNITY)
Admission: EM | Admit: 2010-07-12 | Discharge: 2010-07-12 | Disposition: A | Discharge status: Home / Self Care | Payer: PRIVATE HEALTH INSURANCE | Attending: Emergency Medicine | Admitting: Emergency Medicine

## 2010-07-12 DIAGNOSIS — W19XXXS Unspecified fall, sequela: Secondary | ICD-10-CM | POA: Insufficient documentation

## 2010-07-12 DIAGNOSIS — M549 Dorsalgia, unspecified: Secondary | ICD-10-CM | POA: Insufficient documentation

## 2010-07-12 DIAGNOSIS — IMO0001 Reserved for inherently not codable concepts without codable children: Secondary | ICD-10-CM | POA: Insufficient documentation

## 2010-07-12 LAB — CBC
HCT: 43 % (ref 36.0–46.0)
Hemoglobin: 14.5 g/dL (ref 12.0–15.0)
MCHC: 33.8 g/dL (ref 30.0–36.0)
Platelets: 241 10*3/uL (ref 150–400)
RDW: 13.3 % (ref 11.5–15.5)

## 2010-07-12 LAB — COMPREHENSIVE METABOLIC PANEL
Albumin: 4.2 g/dL (ref 3.5–5.2)
BUN: 8 mg/dL (ref 6–23)
Calcium: 9.7 mg/dL (ref 8.4–10.5)
Glucose, Bld: 77 mg/dL (ref 70–99)
Total Protein: 6.6 g/dL (ref 6.0–8.3)

## 2010-07-12 LAB — URINALYSIS, ROUTINE W REFLEX MICROSCOPIC
Glucose, UA: NEGATIVE mg/dL
Leukocytes, UA: NEGATIVE
Specific Gravity, Urine: 1.01 (ref 1.005–1.030)
pH: 6.5 (ref 5.0–8.0)

## 2010-07-12 LAB — URINE MICROSCOPIC-ADD ON

## 2010-07-15 LAB — URINE CULTURE: Colony Count: 55000

## 2010-07-15 LAB — URINALYSIS, ROUTINE W REFLEX MICROSCOPIC
Bilirubin Urine: NEGATIVE
Bilirubin Urine: NEGATIVE
Glucose, UA: 100 mg/dL — AB
Glucose, UA: NEGATIVE mg/dL
Ketones, ur: NEGATIVE mg/dL
Ketones, ur: NEGATIVE mg/dL
Leukocytes, UA: NEGATIVE
Nitrite: NEGATIVE
Nitrite: POSITIVE — AB
Protein, ur: NEGATIVE mg/dL
Protein, ur: NEGATIVE mg/dL
Specific Gravity, Urine: <1.005 — ABNORMAL LOW (ref 1.005–1.030)
Specific Gravity, Urine: <1.005 — ABNORMAL LOW (ref 1.005–1.030)
Urobilinogen, UA: 0.2 mg/dL (ref 0.0–1.0)
Urobilinogen, UA: 2 mg/dL — ABNORMAL HIGH (ref 0.0–1.0)
pH: 6 (ref 5.0–8.0)
pH: 6 (ref 5.0–8.0)

## 2010-07-15 LAB — DIFFERENTIAL
Basophils Absolute: 0 10*3/uL (ref 0.0–0.1)
Basophils Relative: 0 % (ref 0–1)
Eosinophils Absolute: 0.1 10*3/uL (ref 0.0–0.7)
Eosinophils Relative: 1 % (ref 0–5)
Lymphocytes Relative: 16 % (ref 12–46)
Lymphs Abs: 1.9 10*3/uL (ref 0.7–4.0)
Monocytes Absolute: 0.8 10*3/uL (ref 0.1–1.0)
Monocytes Relative: 7 % (ref 3–12)
Neutro Abs: 9.2 10*3/uL — ABNORMAL HIGH (ref 1.7–7.7)
Neutrophils Relative %: 76 % (ref 43–77)

## 2010-07-15 LAB — BASIC METABOLIC PANEL
BUN: 5 mg/dL — ABNORMAL LOW (ref 6–23)
CO2: 27 mEq/L (ref 19–32)
Calcium: 9.3 mg/dL (ref 8.4–10.5)
Chloride: 106 mEq/L (ref 96–112)
Creatinine, Ser: 0.61 mg/dL (ref 0.4–1.2)
GFR calc Af Amer: >60 mL/min (ref >60)
GFR calc non Af Amer: >60 mL/min (ref >60)
Glucose, Bld: 86 mg/dL (ref 70–99)
Potassium: 3.7 mEq/L (ref 3.5–5.1)
Sodium: 139 mEq/L (ref 135–145)

## 2010-07-15 LAB — URINE MICROSCOPIC-ADD ON

## 2010-07-15 LAB — GC/CHLAMYDIA PROBE AMP, GENITAL
Chlamydia, DNA Probe: NEGATIVE
GC Probe Amp, Genital: NEGATIVE

## 2010-07-15 LAB — CBC
HCT: 37.9 % (ref 36.0–46.0)
Hemoglobin: 13.2 g/dL (ref 12.0–15.0)
MCHC: 35 g/dL (ref 30.0–36.0)
MCV: 95.6 fL (ref 78.0–100.0)
Platelets: 198 10*3/uL (ref 150–400)
RBC: 3.96 MIL/uL (ref 3.87–5.11)
WBC: 12.1 10*3/uL — ABNORMAL HIGH (ref 4.0–10.5)

## 2010-07-15 LAB — WET PREP, GENITAL
Trich, Wet Prep: NONE SEEN
Yeast Wet Prep HPF POC: NONE SEEN

## 2010-08-23 NOTE — Discharge Summary (Signed)
Rebecca [name], Rebecca [name]              ACCOUNT NO.:  [id number]   MEDICAL RECORD NO.:  [id number]          PATIENT TYPE:  INP   LOCATION:  A303                          FACILITY:  [facility]   PHYSICIAN:  Bonnielee Haff, MD     DATE OF BIRTH:  07/24/1950   DATE OF ADMISSION:  11/08/2006  DATE OF DISCHARGE:  LH                               DISCHARGE SUMMARY   ANTICIPATED DATE OF DISCHARGE:  The date of possible discharge is November 11, 2006.   HISTORY OF THE PRESENT ILLNESS:  Please review the H&P dictated by Dr.  Rhae Hammock for details regarding the patient's presenting illness.   PRIMARY CARE PHYSICIAN:  The patient's primary care physician is  actually a P.A., Harmon Pier.   DISCHARGE DIAGNOSES:  1. Hematochezia likely from ischemic colitis, improved.  2. History of chronic back pain.  3. History of depression and anxiety, stable.  4. History of tobacco abuse; counseled.   HOSPITAL COURSE:  Briefly, this is a 60 year old Caucasian female who  presented with complaints of bright red blood per rectum.  She has a  chronic history of left-sided abdominal pain for the past two years.  Apparently she had a CT recommended for this, but she had been unable to  get this done because of financial reasons.  In any case, the patient  was admitted to the hospital.   The patient underwent a CT scan, which showed a possible apple core  lesion of the distal transverse colon and no other findings were  appreciated; however, actually it was mentioned that there was mild  aortic atherosclerosis, but there was no comment on the vasculature,  particularly of the mesenteric arteries.  So, the patient underwent  colonoscopy yesterday, which showed a normal rectum, but it did show a  few scattered sigmoid diverticula and marked inflammatory changes in a  10-15 mm segment of the left colon consistent with ischemic or segmental  colitis.  GI felt that her symptoms were suggestive of ischemic colitis  more than any other form of colitis.  I am not sure if she had biopsies  taken.   Anyway she was counseled to quit smoking.  GI will follow up for her  chronic abdominal pain.  They feel that if her pain does not subside she  may need to have an angiogram of her mesenteric system.  In any case she  has not had any bleeding over the past one day.  Unfortunately, the only  H&H that I have is from admission, which were normal.   The patient has chronic back pain, depression and anxiety; all these  issues are stable at this time.   Today the patient is feeling better, though she is having a little bit  of blood in her bowel movements whenever she goes, but not as much as  she was having when she was admitted.  She is still having some  abdominal pain, but is able to tolerate by mouth intake.  Her chronic  back pain is also present.   PHYSICAL EXAMINATION:  VITAL SIGNS:  Subjectively her vital signs  show  that they have been stable.  She is saturating well.  Her blood pressure  is good.  She has afebrile.  ABDOMEN:  The abdomen is actually slightly tender in the left lower  quadrant.  There is no rebound, rigidity or guarding.  Bowel sounds are  present and normal.  Otherwise the remainder of the examination is unremarkable.   LABORATORY DATA:  The lab data is as mentioned.  There is only one H&H.   PLAN:  1. We will repeat her CBC today to make sure the H&H remain stable.      If there is a drop we will recheck them again tomorrow morning.  2. I think the plan is for her to go home after GI sees her in the      morning and they will set up a followup appointment for her.   I wrote her a prescription for Percocet that she can take as needed for  pain; otherwise, she needs to follow up with her PMD as needed.   DISCHARGE MEDICATIONS:  1. Percocet 5/325 one tablet every six hours as needed for pain;      otherwise, she will continue her home medications, which are the       following;  2. Lexapro 10 mg twice daily.  3. Xanax 0.5 mg twice daily.   DIET:  Low-residue diet.   ACTIVITY:  Physical activities; no restrictions.   Hence, depending on blood work and GI input, the patient may be able to  go home on November 11, 2006.      Bonnielee Haff, MD  Electronically Signed     GK/MEDQ  D:  11/10/2006  T:  11/11/2006  Job:  [id number]   cc:   Caro Hight, M.D.  [address]  [place] , [place] [address]

## 2010-08-23 NOTE — H&P (Signed)
Rebecca, [name] NO.:  [id number]   MEDICAL RECORD NO.:  [id number]          PATIENT TYPE:  INP   LOCATION:  A303                          FACILITY:  [facility]   PHYSICIAN:  Salem Caster, DO    DATE OF BIRTH:  [birth year]   DATE OF ADMISSION:  11/08/2006  DATE OF DISCHARGE:  LH                              HISTORY & PHYSICAL   PRIMARY CARE PHYSICIAN:  Dr. Hassell Done   CHIEF COMPLAINT:  Rectal bleeding.   HISTORY OF PRESENT ILLNESS:  This is a 60 year old Caucasian female, who  presents, complaining of rectal bleeding.  Patient states that she has  had a history of lower left quadrant abdominal pain for the past two  years and has had a CT recommended, but due to financial reasons, has  not had this performed.  Patient also states that she thinks she had  colonoscopy possibly 20+ years ago.  Patient states that she was  awakened last night with severe abdominal pain and diarrhea.  After  having diarrhea, she noticed there was blood in her stool.  Patient  became nauseated and having tremors during this first episode.  Patient  went to her primary care physician's office, had a stool sample that was  Hemoccult-positive, and her primary care physician was going to set  patient up for a colonoscopy.  Patient went back home, had approximately  ten bowel movements that she states were blood clots, but no actual  bowel movements.  Patient then presented to the emergency room for  evaluation.   PAST MEDICAL HISTORY:  Positive for depression, anxiety, pleurisy,  bronchitis.  SURGICAL HISTORY:  Hysterectomy, carpal tunnel surgery,  right hand.   SOCIAL HISTORY:  One half to one pack-a-day smoker for approximately 40  years.  Denies any alcohol or drug abuse.   FAMILY HISTORY:  Positive for colon cancer and lung cancer.   DRUG ALLERGIES:  PENICILLIN and CODEINE.   HOME MEDICATIONS:  1. Lexapro 20 mg daily.  2. Xanax 0.5 mg twice a day.   REVIEW OF SYSTEMS:   GENERAL:  Negative fever, chills, weight-loss,  weight-gain.  HEENT:  No eye pain, mouth, neck or throat pain.  CARDIOVASCULAR:  No chest pain.  RESPIRATORY:  No cough, dyspnea or  wheezing.  GI:  Positive for diarrhea and abdominal pain, blood in the  stool and nausea.  GENITOURINARY:  No dysuria or flank pain.  MUSCULOSKELETAL:  Positive for some back pain and arthralgias.  All  other systems are negative.   PHYSICAL EXAM:  VITAL SIGNS:  Temperature is 97.2, pulse 66,  respirations 20, blood pressure 127/66.  Patient is satting 95% on room  air.  GENERAL:  Patient is awake, alert and oriented times three, in no acute  distress, pleasant and cooperative.  HEENT:  Head is atraumatic, normocephalic.  No scleral icterus.  NECK:  Soft, supple, nontender, nondistended.  No JVD, no thyromegaly.  CARDIOVASCULAR:  Regular rate and rhythm, no murmurs, gallops or rubs.  LUNGS:  Clear to auscultation bilaterally, no rales, rhonchi or  wheezing.  ABDOMEN:  Positive tenderness to lower  left quadrant on palpation.  No  rigidity or guarding.  Positive bowel sounds appreciated.  EXTREMITIES:  No clubbing, cyanosis or edema.  NEUROLOGIC:  Cranial nerves II through XII are grossly intact.  Patient  is alert and oriented times three.   LABORATORY DATA:  PTT is 32, PT 12.7, INR 0.9.  sodium 137, potassium  3.7, chloride 107, CO2 25, glucose 92, BUN 5, creatinine 0.48.  White  count 8.6, hemoglobin 14.3, hematocrit 41.8, platelets 269.  Microscopic  urine:  Many bacteria, 7-10 RBC, 7-10 WBC.  Routine urinalysis:  Moderate blood, otherwise negative.   RADIOLOGIC STUDIES:  CT of her abdomen showed a possible apple core  lesion in the distal transverse colon, suspicious for colon cancer, also  showed decompressed without obvious wall-thickening adrenal hyperplasia.   ASSESSMENT:  This is a 60 year old white female, who presents with acute  on chronic abdominal pain and some rectal bleeding.  Patient went  to her  PCP's office and was Hemoccult-positive and was going to be scheduled  for colonoscopy, but presented to emergency room with severe abdominal  pain and rectal bleeding.   PLAN:  1. For her rectal bleeding, patient will not have any aspirin or      anticoagulants.  Gastroenterology has been contacted and consulted      for possible colonoscopy.  Patient will be kept n.p.o. except for      medications.  We will follow her hemoglobin and hematocrit.  2. For lower left quadrant pain, CT scan did show an apple core lesion      in the distal transverse colon, suspicious for colon carcinoma.  We      will defer to gastroenterology after colonoscopy for further      recommendations.  3. For history of depression and anxiety, patient is on Xanax.  This      will be continued at this time.  4. For history of tobacco abuse, patient will be placed on nicotine      patch 20 mg daily.   Of note, patient does have a family history of colon cancer, as well as  lung cancer.      Salem Caster, DO  Electronically Signed     SM/MEDQ  D:  11/09/2006  T:  11/09/2006  Job:  [id number]   cc:   Dr. Hassell Done, [place]

## 2010-08-23 NOTE — Consult Note (Signed)
Rebecca [name], Rebecca [name]              ACCOUNT NO.:  [id number]   MEDICAL RECORD NO.:  [id number]          PATIENT TYPE:  INP   LOCATION:  A303                          FACILITY:  [facility]   PHYSICIAN:  R. Garfield Cornea, M.D. DATE OF BIRTH:  [birth year]   DATE OF CONSULTATION:  11/09/2006  DATE OF DISCHARGE:                                 CONSULTATION   REASON FOR CONSULTATION:  Hematochezia.   PHYSICIAN REQUESTING CONSULTATION:  [facility].   PRIMARY CARE PHYSICIAN:  Chipper Herb, MD; [place],  [place], [place].   HISTORY OF PRESENT ILLNESS:  The patient is a 60 year old Caucasian  female who was admitted last night with severe left lower quadrant  abdominal pain and large-volume hematochezia.  She states she has had  left lower quadrant pain for over 1 year.  She saw her physician who  recommended a CT quite some time ago, but because she had no insurance  and financial concerns, she basically put this off.  She states that for  the past 1 week she started passing small-volume fresh blood per rectum.  She started noticing increased left lower quadrant abdominal pain.  She  started passing very soft stingy stool.  Yesterday morning, she had a  bowel movement which was mostly blood.  She say the nurse practitioner  with Dr. Tawanna Sat office and had a rectal exam, and she had Heme-positive  stools per patient's report.  She is being scheduled for a colonoscopy,  but because of progressive abdominal pain and passing tinged stools  yesterday along with blood clots, she decided to come to the emergency  department.  She has had no vomiting.  She had a colonoscopy over 20  years ago for abdominal pain and states it was normal.  She has a  maternal uncle who was diagnosed with colon cancer in his 30s and  succumbed to the disease.  She states she has lost about 10 pounds over  the last 6 months.   MEDICATIONS AT HOME:  1. Lexapro 10 mg b.i.d.  2. Xanax 0.5 mg  b.i.d.   ALLERGIES:  PENICILLIN AND CODEINE.   PAST MEDICAL HISTORY:  Depression, anxiety, tobacco abuse, bronchitis,  emphysema, hysterectomy, remote colonoscopy as above.   FAMILY HISTORY:  Maternal uncle succumbed to colon cancer in his 71s.  No family history of chronic GI illnesses or liver disease.   SOCIAL HISTORY:  She is married.  She has 2 biological children and 1  stepchild.  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  She smokes half-to-1-pack of cigarettes  daily and smoked for 40 years.  Denies any alcohol or drug use.   REVIEW OF SYSTEMS:  See HPI for GI and constitutional.  CARDIOPULMONARY:  She denies any chest pain.  She complains of dyspnea on exertion and  chronic cough.  GENITOURINARY:  She complains of nocturia.   PHYSICAL EXAMINATION:  VITAL SIGNS:  Temperature 97.2, pulse 66,  respirations 20, blood pressure 127/66, O2 sats 95% on room air.  GENERAL:  Pleasant, well-nourished, well-developed, Caucasian female in  no acute distress.  SKIN:  Warm and dry.  No jaundice.  HEENT:  Sclerae nonicteric, oropharyngeal mucosa moist and pink.  No  lymphadenopathy.  CHEST:  Lungs are clear to auscultation.  CARDIAC:  Regular rate and rhythm, normal S1 and S2.  No murmurs, rubs  or gallops.  ABDOMEN:  Positive bowel sounds, abdomen soft.  She has moderate left  lower quadrant tenderness to deep palpation.  No rebound tenderness,  some subjective guarding.  No hepatosplenomegaly or masses.  No bruits.  EXTREMITIES:  No edema.   LABS:  White count 8600, hemoglobin 14.3, hematocrit 41.8, platelets  269,000.  Sodium 137, potassium 3.7, BUN 5, creatinine 0.48, glucose 92,  INR 0.9.  CT revealed bilateral adrenal hyperplasia, left hepatic cyst,  probable bilateral renal cysts.  She had focal narrowing of the distal  transverse colon just proximally to the splenic flexure.  Distally the  colon was relatively decompressed.    IMPRESSION:  The patient is a 60 year old lady with a 1-year history of  left lower quadrant abdominal pain who has developed large-volume  hematochezia.  CT findings reveal probable apple-core lesion in the  distal transverse colon, suggestive of colon carcinoma.  She also has  bilateral adrenal hyperplasia of unclear significance.   PLAN:  1. Colonoscopy today.  2. Further recommendations to follow.      Neil Crouch, P.ABridgette Habermann, M.D.  Electronically Signed    LL/MEDQ  D:  11/09/2006  T:  11/09/2006  Job:  [id number]   cc:   Chipper Herb, M.D.  Fax: [phone]   [facility]

## 2010-08-23 NOTE — Assessment & Plan Note (Signed)
NAMEMarland Kitchen  MORRISSA, SHEIN               CHART#:  [id number]   DATE:  12/04/2006                       DOB:  Dec 27, 1950   REASON FOR VISIT:  Follow up recent hospitalization for ischemic  colitis.   HISTORY:  Ms. Minniear underwent a colonoscopy on November 09, 2006, when  she presented with acute left lower quadrant abdominal pain and bloody  diarrhea.  A CT scan demonstrated inflammatory changes of the descending  colon.  There was a questionable abnormal cord in the distal transverse  colon.  She was found to have a normal rectum and a 10-15 cm segment of  markedly inflamed left colon and some similar diverticula.  Biopsies  were consistent with ischemic colitis.  She stopped smoking on November 09, 2006.   She has had chronic left lower quadrant abdominal pain for years.  She  has significant discogenic back disease.  She is followed by Dr. Collie Siad  over in [place].   In addition, she has intermittent urinary incontinence.  She has  localized left groin pain, which is worse when she stands or sits up and  sometimes when she picks heavy objects up.   I reviewed the CT scan with Dr. Charlann Boxer. Jobe Igo in a phone conversation  today.  It appears that she has a significant cystocele with compression  of the urinary bladder and also has significant left sacral ileitis.  It  is notable that she had no inflammatory changes in her colon to suggest  idiopathic inflammatory bowel disease.  She does not have any  postprandial abdominal pain.  She does not have any flank pain.  There  is no evidence of nephrolithiasis on the recent CT.  She has been taking  oxycodone, APAP and this has been making her sick.  She does not like  taking it.  She also takes Lexapro and aspirin 81 mg daily and  Alprazolam 0.5 mg twice daily.   ALLERGIES:  PENICILLIN, KEFLEX AND CODEINE.   PHYSICAL EXAMINATION:  VITAL SIGNS:  Weight 155.5 pounds, height 5 feet  7 inches, temperature 98.2 degrees, blood pressure 118/70, pulse  72.  CHEST:  Lungs are clear to auscultation.  HEART:  A regular rate and rhythm without murmur, gallop or rub.  BREASTS:  Exam is deferred.  ABDOMEN:  Full positive bowel sounds, soft.  She has some left, almost  inguinal, tenderness.  I do not appreciate a hernia.  No appreciable  mass or organomegaly.  EXTREMITIES:  No edema.   IMPRESSION:  1. Ms. Denny is a pleasant 60 year old lady with a self-limiting      bout of left-sided ischemic or segmental colitis.  She should do      well.  This is almost always a non-recurrent phenomenon.  I am glad      to see she stopped smoking.  2. She has symptoms to go with a cystocele and radiographic evidence      on a CT.  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   RECOMMENDATIONS:  Consider short-term course of Aleve or Advil,  following the label instructions.  We are going to take the liberty to  get her back to see Dr. Barrie Dunker, in reference to her cystocele and to see  Dr. Collie Siad regarding her sacroiliitis and what is more likely SI joint pain  than anything else.  I see no need to resume any further GI studies at  this time.  I will wait to hear from Dr. Collie Siad and Dr. Barrie Dunker, to see what  they think about Ms. Rakes's other problems.       Bridgette Habermann, M.D.  Electronically Signed     RMR/MEDQ  D:  12/04/2006  T:  12/05/2006  Job:  [id number]

## 2010-08-23 NOTE — Op Note (Signed)
NAMEZOUA, CAPORASO              ACCOUNT NO.:  [id number]   MEDICAL RECORD NO.:  [id number]          PATIENT TYPE:  INP   LOCATION:  A303                          FACILITY:  [facility]   PHYSICIAN:  R. Garfield Cornea, M.D. DATE OF BIRTH:  04/26/1950   DATE OF PROCEDURE:  11/09/2006  DATE OF DISCHARGE:                                PROCEDURE NOTE   PROCEDURE:  Colonoscopy with biopsy.   ENDOSCOPIST:  Bridgette Habermann, M.D.   INDICATIONS FOR PROCEDURE:  The patient is a 59 year old Caucasian  female who presented to Dr. Olin Hauser last night in the ED with left lower  quadrant abdominal pain and hematochezia.  CT demonstrated a 10-cm  segment of left colon consistent with colitis (I personally reviewed the  CT with Dr. Thornton Papas.  Although there was some interpretation of possible  apple core lesion in the distal transverse colon, this did not appear  to be very impressive may well have been an artifact secondary to fold  configuration.  She clearly has changes of colitis at the junction  between the descending and sigmoid on CT.  She does smoke.  Colonoscopy  is now being done.  This approach has been discussed with the patient at  length and potential risks, benefits and alternatives have been reviewed  and questions answered; she is agreeable.  Please see the documentation  in the medical record.   PROCEDURE NOTE:  O2 saturation, blood pressure, pulse and respirations  were monitor throughout the entire procedure.   CONSCIOUS SEDATION:  Versed 7 mg IV, Demerol 125 mg IV in divided doses.   INSTRUMENT:  Pentax video chip system.   FINDINGS:  Digital rectal exam revealed no abnormalities.   ENDOSCOPIC FINDINGS:  The prep was good.   COLON:  The colonic mucosa was surveyed from the rectosigmoid junction  through the left, transverse and right colon to area of the appendiceal  orifice, ileocecal valve and cecum.  These structures well seen and  photographed for the record.  From this  level, the scope was slowly  withdrawn and all previously mentioned mucosal surface were again seen.  The patient had a few scattered sigmoid diverticula.  There was a 10- to  15-cm segment of markedly abnormal colonic mucosa straddling the  junction between the descending and sigmoid colon.  There was  ulceration, erosion and reactive-appearing mucosa through this segment;  please see photos.  There was no evidence of neoplasm anywhere in the  colon.  This abnormal area of mucosa in the left colon was biopsied for  histologic study.  The scope was pulled down into the rectum.  The  patient's rectum would not hold the air; therefore, I was not able to  retroflex, but I was able to see the rectal mucosa fairly well en face  and it appeared normal.  The patient tolerated the procedure well and  was reactive after endoscopy.   IMPRESSION:  1. Normal rectum.  2. A few scattered sigmoid diverticula and marked inflammatory changes      involving a 10- to 15-cm segment of left colon, most consistent  with ischemic or segmental colitis, otherwise normal colon.   Her presentation is classic for ischemic colitis.  The good news is she  did not have evidence of colon cancer today.  She has had some left-  sided abdominal pain chronically for 1 year.  She does smoke and it is  possible she has some chronic ischemia to her left colon.   RECOMMENDATIONS:  1. I have strongly urged smoking cessation.  2. Follow up on biopsies.  3. Low-residue diet.  4. If her left-sided abdominal pain does not subside, would consider      study of her mesenteric vasculature.      Bridgette Habermann, M.D.  Electronically Signed     RMR/MEDQ  D:  11/09/2006  T:  11/10/2006  Job:  [id number]   cc:   Gypsy Balsam. Olin Hauser, M.D.  Fax: [phone]   [place] [facility]

## 2011-01-10 LAB — CBC
HCT: 46.4 — ABNORMAL HIGH
Hemoglobin: 15.6 — ABNORMAL HIGH
Platelets: 348
WBC: 15 — ABNORMAL HIGH

## 2011-01-10 LAB — DIFFERENTIAL
Eosinophils Relative: 0
Lymphocytes Relative: 15
Lymphs Abs: 2.2
Monocytes Absolute: 1.1 — ABNORMAL HIGH

## 2011-01-10 LAB — POCT I-STAT, CHEM 8
BUN: 17
Creatinine, Ser: 0.8
Sodium: 136
TCO2: 26

## 2011-01-23 LAB — CBC
HCT: 35.7 — ABNORMAL LOW
HCT: 41.8
MCHC: 34.2
MCV: 94.2
Platelets: 269
RBC: 3.79 — ABNORMAL LOW
WBC: 8.6

## 2011-01-23 LAB — URINE MICROSCOPIC-ADD ON

## 2011-01-23 LAB — APTT: aPTT: 32

## 2011-01-23 LAB — BASIC METABOLIC PANEL
BUN: 5 — ABNORMAL LOW
Creatinine, Ser: 0.48
GFR calc non Af Amer: >60
Potassium: 3.7

## 2011-01-23 LAB — DIFFERENTIAL
Basophils Relative: 1
Eosinophils Absolute: 0.3
Eosinophils Relative: 4
Lymphocytes Relative: 34
Lymphs Abs: 2.9
Monocytes Absolute: 0.6
Monocytes Relative: 9
Neutrophils Relative %: 37 — ABNORMAL LOW
Neutrophils Relative %: 53

## 2011-01-23 LAB — URINALYSIS, ROUTINE W REFLEX MICROSCOPIC
Glucose, UA: NEGATIVE
Ketones, ur: NEGATIVE
Leukocytes, UA: NEGATIVE
pH: 6

## 2011-01-23 LAB — TYPE AND SCREEN
ABO/RH(D): O POS
Antibody Screen: NEGATIVE

## 2011-01-23 LAB — PROTIME-INR
INR: 0.9
Prothrombin Time: 12.7

## 2011-02-09 ENCOUNTER — Other Ambulatory Visit: Payer: Self-pay | Admitting: Family Medicine

## 2011-02-09 MED ORDER — MOMETASONE FUROATE 50 MCG/ACT NA SUSP: 2.0000 | Freq: Every day | NASAL | Status: DC

## 2011-03-01 ENCOUNTER — Other Ambulatory Visit: Payer: Self-pay | Admitting: Otolaryngology

## 2011-03-07 ENCOUNTER — Other Ambulatory Visit (HOSPITAL_COMMUNITY): Payer: Self-pay | Admitting: Otolaryngology

## 2011-03-10 ENCOUNTER — Ambulatory Visit (HOSPITAL_COMMUNITY)
Admission: RE | Admit: 2011-03-10 | Discharge: 2011-03-10 | Disposition: A | Discharge status: Home / Self Care | Payer: No Typology Code available for payment source | Source: Ambulatory Visit | Attending: Otolaryngology | Admitting: Otolaryngology

## 2011-03-10 DIAGNOSIS — K449 Diaphragmatic hernia without obstruction or gangrene: Secondary | ICD-10-CM | POA: Insufficient documentation

## 2011-03-10 DIAGNOSIS — R1310 Dysphagia, unspecified: Secondary | ICD-10-CM | POA: Insufficient documentation

## 2011-03-13 ENCOUNTER — Encounter: Payer: Self-pay | Admitting: Internal Medicine

## 2011-03-23 ENCOUNTER — Encounter: Payer: Self-pay | Admitting: Internal Medicine

## 2011-03-31 ENCOUNTER — Ambulatory Visit (INDEPENDENT_AMBULATORY_CARE_PROVIDER_SITE_OTHER): Payer: PRIVATE HEALTH INSURANCE | Admitting: Internal Medicine

## 2011-03-31 ENCOUNTER — Encounter: Payer: Self-pay | Admitting: Internal Medicine

## 2011-03-31 DIAGNOSIS — R1310 Dysphagia, unspecified: Secondary | ICD-10-CM

## 2011-03-31 DIAGNOSIS — Z1211 Encounter for screening for malignant neoplasm of colon: Secondary | ICD-10-CM

## 2011-03-31 DIAGNOSIS — K559 Vascular disorder of intestine, unspecified: Secondary | ICD-10-CM

## 2011-03-31 MED ORDER — PANTOPRAZOLE SODIUM 40 MG PO TBEC: 40.0000 mg | DELAYED_RELEASE_TABLET | Freq: Every day | ORAL | Status: DC

## 2011-03-31 NOTE — Progress Notes (Signed)
Subjective:    Patient ID: Rebecca [name], female    DOB: [birth year], 60 y.o.   MRN: [id number]  HPI Rebecca [name] is a 60 year old female with a past medical history of GERD, hypertension, fibromyalgia, and an episode of ischemic colitis in 2010 who seen in consultation at the request of Dr. Edrick Oh for evaluation of dysphagia.  The patient reports that she has trouble swallowing over the past 3-6 months. This is both solid and liquid, that tends to be more with solid. She also reports some nausea and a feeling of needing to vomit but being unable to. She does note some weight gain, which apprises her because she feels like she small amounts. She also reports one year of early satiety. She does have some heartburn. No odynophagia. Mild sore throat. No hoarseness. Prior cough but this is improved. She does note some crampy lower abdominal pain, but denies epigastric pain. Her bowel movements are somewhat erratic and can be loose at times. She reports having a bowel movement once every 3 days and then she'll have a bowel movement daily. She is not sure what makes the difference for her she reports her last colonoscopy was in 2010 after she developed acute lower abdominal pain and rectal bleeding. She reports being diagnosed with ischemic colitis at this time.  Does. She does note significant stress in her life over her husband's illness, caring for her mother who is in her 46s and lives with them, and the recent death of her sister-in-law from brain cancer.  Review of Systems Constitutional: Negative for fever, chills, night sweats, activity change, appetite change and unexpected weight change HEENT: See history of present illness, positive for chronic hearing loss. Eyes: Negative for visual disturbance Respiratory: Negative for chest tightness and shortness of breath, positive for cough (see history of present illness) Cardiovascular: Negative for chest pain, palpitations and lower extremity  swelling Gastrointestinal: See history of present illness Genitourinary: Negative for dysuria and hematuria, positive for intermittent urinary leakage. Musculoskeletal: Positive for back pain, negative arthralgias and myalgias Skin: Negative for rash or color change Neurological: Negative for headaches, weakness, numbness Hematological: Negative for adenopathy, negative for easy bruising/bleeding Psychiatric/behavioral: Negative for depressed mood, positive for anxiety   Past Medical History  Diagnosis Date  . Tinnitus   . Dysphagia   . Sensorineural hearing loss   . GERD (gastroesophageal reflux disease)   . Hiatal hernia   . 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 Hypertension   . Fibromyalgia   . Colitis, ischemic    Past Surgical History  Procedure Date  . Bladder surgery   . Breast surgery   . Abdominal hysterectomy   . Neck surgery     cyst on neck  . Pillonidal cyst     resection   Current Outpatient Prescriptions  Medication Sig Dispense Refill  . albuterol (PROAIR HFA) 108 (90 BASE) MCG/ACT inhaler Inhale 2 puffs into the lungs every 6 (six) hours as needed for wheezing. Please dispense 3 month supply  1 Inhaler  3  . ALPRAZolam (XANAX) 0.5 MG tablet Take 0.5 mg by mouth at bedtime as needed.        . clobetasol ointment (TEMOVATE) 0.05 % Apply topically 2 (two) times daily.        Marland Kitchen estrogens, conjugated, (PREMARIN) 0.625 MG tablet Take 0.625 mg by mouth daily. As needed       . mometasone (NASONEX) 50 MCG/ACT nasal spray Place 2 sprays into the nose  daily. As needed       . sertraline (ZOLOFT) 50 MG tablet Take 50 mg by mouth daily.        . pantoprazole (PROTONIX) 40 MG tablet Take 1 tablet (40 mg total) by mouth daily.  30 tablet  1   Allergies  Allergen Reactions  . Bupropion Hcl   . Cephalexin     REACTION: Bruning with urination  . Darvocet (Propoxyphene N-Acetaminophen)   . Penicillins     REACTION: Swelling and rash   Family History   Problem Relation Age of Onset  . Anemia    . Cancer    . Diabetes    . Sleep apnea     Social History  . Marital Status: Married   Social History Main Topics  . Smoking status: Current Everyday Smoker  . Smokeless tobacco: Never Used   Comment: gave pt a sheet  . Alcohol Use: No  . Drug Use: No      Objective:   Physical Exam BP 120/80  Pulse 84  Ht 5' 6"  (1.676 m)  Wt 159 lb (72.122 kg)  BMI 25.66 kg/m2 Constitutional: Well-developed and well-nourished. No distress. HEENT: Normocephalic and atraumatic. Oropharynx is clear and moist. No oropharyngeal exudate. Conjunctivae are normal. Pupils are equal round and reactive to light. No scleral icterus. Neck: Neck supple. Trachea midline. Cardiovascular: Normal rate, regular rhythm and intact distal pulses. No M/R/G Pulmonary/chest: Effort normal and breath sounds normal. No wheezing, rales or rhonchi. Abdominal: Soft, mild tenderness to palpation in the epigastrium and lower abdomen without rebound or guarding, nondistended. Bowel sounds active throughout. There are no masses palpable. No hepatosplenomegaly. Extremities: no clubbing, cyanosis, or edema Lymphadenopathy: No cervical adenopathy noted. Neurological: Alert and oriented to person place and time. Skin: Skin is warm and dry. No rashes noted. Psychiatric: Normal mood and affect. Behavior is normal.  Barium esophagram 03/10/2011 Small hilar hernia identified with free reflux observed. No mass lesion or mucosal abnormality identified apart from the incidental note of minimal feline type esophageal mucosal pattern.    Assessment & Plan:  60 year old female with a past medical history of GERD, hypertension, fibromyalgia, and an episode of ischemic colitis in 2010 who seen in consultation at the request of Dr. Edrick Oh for evaluation of dysphagia  1. Dysphagia --the patient's dysphagia combined with the findings of her barium esophagram raise the suspicion for eosinophilic  esophagitis.  This will need to be a diagnosis made by biopsy. With this in mind we will schedule her for EGD. We have discussed this test today and she agrees to proceed.  She is not on a PPI therapy at present, and I will start her on pantoprazole 40 mg daily. We discussed how best to take this medication, specifically 30 minutes to one hour prior to her first meal of the day. Further therapy may be necessary after endoscopy if her symptoms persist on PPI.  2. History of ischemic colitis -- the patient does have a history of ischemic colitis, and had a colonoscopy in 2010. She seems to remember being told she needs yearly colonoscopy, but I do not know the indication for such. We will request records from her prior gastroenterologist before determining when she is due for colonoscopy.

## 2011-03-31 NOTE — Patient Instructions (Addendum)
You have been given a separate informational sheet regarding your tobacco use, the importance of quitting and local resources to help you quit.  You have been scheduled for an endoscopy. Please follow written instructions given to you at your visit today.  We have sent the following medications to your pharmacy for you to pick up at your convenience

## 2011-04-28 ENCOUNTER — Other Ambulatory Visit: Payer: No Typology Code available for payment source | Admitting: Internal Medicine

## 2011-05-05 ENCOUNTER — Encounter: Payer: Self-pay | Admitting: Internal Medicine

## 2011-05-05 ENCOUNTER — Ambulatory Visit (AMBULATORY_SURGERY_CENTER): Payer: No Typology Code available for payment source | Admitting: Internal Medicine

## 2011-05-05 VITALS — BP 144/81 | HR 68 | Temp 98.0°F | Resp 18 | Ht 66.0 in | Wt 159.0 lb

## 2011-05-05 DIAGNOSIS — K2970 Gastritis, unspecified, without bleeding: Secondary | ICD-10-CM

## 2011-05-05 DIAGNOSIS — R1310 Dysphagia, unspecified: Secondary | ICD-10-CM

## 2011-05-05 DIAGNOSIS — K219 Gastro-esophageal reflux disease without esophagitis: Secondary | ICD-10-CM

## 2011-05-05 DIAGNOSIS — K2990 Gastroduodenitis, unspecified, without bleeding: Secondary | ICD-10-CM

## 2011-05-05 DIAGNOSIS — D130 Benign neoplasm of esophagus: Secondary | ICD-10-CM

## 2011-05-05 HISTORY — PX: UPPER GI ENDOSCOPY: SHX6162

## 2011-05-05 MED ORDER — ESOMEPRAZOLE MAGNESIUM 40 MG PO CPDR: 40.0000 mg | DELAYED_RELEASE_CAPSULE | Freq: Every day | ORAL | Status: DC

## 2011-05-05 MED ORDER — SODIUM CHLORIDE 0.9 % IV SOLN: 500.0000 mL | INTRAVENOUS | Status: DC

## 2011-05-05 NOTE — Patient Instructions (Signed)
Please refer to blue and green discharge instruction sheets. 

## 2011-05-05 NOTE — Progress Notes (Signed)
Patient did not experience any of the following events: a burn prior to discharge; a fall within the facility; wrong site/side/patient/procedure/implant event; or a hospital transfer or hospital admission upon discharge from the facility. (G8907) Patient did not have preoperative order for IV antibiotic SSI prophylaxis. (G8918)  

## 2011-05-05 NOTE — Progress Notes (Signed)
The pt needs to remove her upper and lower dentures.maw

## 2011-05-05 NOTE — Op Note (Signed)
[facility]. [place], [place]  [id number]  ENDOSCOPY PROCEDURE REPORT  PATIENT:  Rebecca [name], Rebecca [name]  MR#:  [id number] BIRTHDATE:  04-26-1950, 60 yrs. old  GENDER:  female ENDOSCOPIST:  Lajuan Lines. Adewale Pucillo, MD Referred by:  Dione Housekeeper, M.D. PROCEDURE DATE:  05/05/2011 PROCEDURE:  EGD with biopsy, 43239, EGD with balloon dilatation ASA CLASS:  Class II INDICATIONS:  heartburn, dysphagia MEDICATIONS:   These medications were titrated to patient response per physician's verbal order, Benadryl 25 mg IV, Fentanyl 100 mcg IV, Versed 8 mg IV TOPICAL ANESTHETIC:  Cetacaine Spray  DESCRIPTION OF PROCEDURE:   After the risks benefits and alternatives of the procedure were thoroughly explained, informed consent was obtained.  The LB GIF-H180 [id number] endoscope was introduced through the mouth and advanced to the second portion of the duodenum, without limitations.  The instrument was slowly withdrawn as the mucosa was fully examined. <<PROCEDUREIMAGES>>  LA Grade C esophagitis was found in the distal esophagus. Multiple biopsies were obtained and sent to pathology to rule out EoE.  A non-obstructing Schatzki's's ring was found at the gastroesophageal junction. Balloon dilation was performed to maximum diameter of 16.5 mm with success.  A medium-sized hiatal hernia was found.  A submucosal nodule, measuring approximately 8 mm was found in the antrum. Multiple biopsies, in tunnelled fashion, were obtained and sent to pathology.  The duodenal bulb was normal in appearance, as was the postbulbar duodenum. Retroflexed views revealed findings as previously described.    The scope was then withdrawn from the patient and the procedure completed.  COMPLICATIONS:  None  ENDOSCOPIC IMPRESSION: 1) Esophagitis in the distal esophagus.  Multiple biopsies obtained. 2) Schatzki's's ring at the gastroesophageal junction.  Balloon dilation performed. 3) Hiatal hernia 4) Small  submucosal nodule in the antrum.  Multiple biopsies obtained. 5) Normal duodenum  RECOMMENDATIONS: 1) Await pathology results 2) Clear liquids for 6 hours, then soft diet today, resume regular diet tomorrow. 3) Given diarrhea associated with pantoprazole, will start Nexium 40 mg once daily.  This needs to be taken 30 minutes before breakfast.  If you need to stop this medication for any reason please notify our office. 4) Office visit to be seen in 4-6 weeks to reassess symptoms/response to medication and dilation.  Lajuan Lines. Hilarie Fredrickson, MD  CC:  Dione Housekeeper, MD The Patient  n. eSIGNED:   Lajuan Lines. Nathalie Cavendish at 05/05/2011 10:40 AM  Rogelia Boga, [phone]

## 2011-05-08 ENCOUNTER — Telehealth: Payer: Self-pay

## 2011-05-08 NOTE — Telephone Encounter (Signed)
  Follow up Call-  Call back number 05/05/2011  Post procedure Call Back phone  # [phone] hm may leave a message     Patient questions:  Do you have a fever, pain , or abdominal swelling? no Pain Score  0 *  Have you tolerated food without any problems? yes  Have you been able to return to your normal activities? yes  Do you have any questions about your discharge instructions: Diet   no Medications  no Follow up visit  no  Do you have questions or concerns about your Care? no  Actions: * If pain score is 4 or above: No action needed, pain <4.

## 2011-05-09 ENCOUNTER — Encounter: Payer: Self-pay | Admitting: Internal Medicine

## 2011-05-11 ENCOUNTER — Telehealth: Payer: Self-pay | Admitting: *Deleted

## 2011-05-11 NOTE — Telephone Encounter (Signed)
Mailed pt a letter with f/u for 06/13/11 at 10am per EGD report.

## 2011-05-17 NOTE — Telephone Encounter (Signed)
Error

## 2011-06-13 ENCOUNTER — Ambulatory Visit: Payer: No Typology Code available for payment source | Admitting: Internal Medicine

## 2011-07-06 ENCOUNTER — Other Ambulatory Visit: Payer: Self-pay | Admitting: Family Medicine

## 2011-07-06 MED ORDER — ALBUTEROL SULFATE HFA 108 (90 BASE) MCG/ACT IN AERS: 2.0000 | INHALATION_SPRAY | Freq: Four times a day (QID) | RESPIRATORY_TRACT | Status: DC | PRN

## 2011-07-31 ENCOUNTER — Other Ambulatory Visit: Payer: Self-pay | Admitting: Family Medicine

## 2011-07-31 MED ORDER — SERTRALINE HCL 50 MG PO TABS: 50.0000 mg | ORAL_TABLET | Freq: Every day | ORAL | Status: DC

## 2011-10-13 ENCOUNTER — Emergency Department (HOSPITAL_COMMUNITY)
Admission: EM | Admit: 2011-10-13 | Discharge: 2011-10-14 | Disposition: A | Discharge status: Home / Self Care | Payer: No Typology Code available for payment source | Attending: Emergency Medicine | Admitting: Emergency Medicine

## 2011-10-13 ENCOUNTER — Encounter (HOSPITAL_COMMUNITY): Payer: Self-pay | Admitting: Emergency Medicine

## 2011-10-13 DIAGNOSIS — R42 Dizziness and giddiness: Secondary | ICD-10-CM | POA: Insufficient documentation

## 2011-10-13 DIAGNOSIS — E785 Hyperlipidemia, unspecified: Secondary | ICD-10-CM | POA: Insufficient documentation

## 2011-10-13 DIAGNOSIS — N949 Unspecified condition associated with female genital organs and menstrual cycle: Secondary | ICD-10-CM | POA: Insufficient documentation

## 2011-10-13 DIAGNOSIS — K625 Hemorrhage of anus and rectum: Secondary | ICD-10-CM | POA: Insufficient documentation

## 2011-10-13 DIAGNOSIS — K529 Noninfective gastroenteritis and colitis, unspecified: Secondary | ICD-10-CM

## 2011-10-13 DIAGNOSIS — F3289 Other specified depressive episodes: Secondary | ICD-10-CM | POA: Insufficient documentation

## 2011-10-13 DIAGNOSIS — R109 Unspecified abdominal pain: Secondary | ICD-10-CM | POA: Insufficient documentation

## 2011-10-13 DIAGNOSIS — R319 Hematuria, unspecified: Secondary | ICD-10-CM | POA: Insufficient documentation

## 2011-10-13 DIAGNOSIS — K219 Gastro-esophageal reflux disease without esophagitis: Secondary | ICD-10-CM | POA: Insufficient documentation

## 2011-10-13 DIAGNOSIS — F329 Major depressive disorder, single episode, unspecified: Secondary | ICD-10-CM | POA: Insufficient documentation

## 2011-10-13 DIAGNOSIS — R5381 Other malaise: Secondary | ICD-10-CM | POA: Insufficient documentation

## 2011-10-13 DIAGNOSIS — K5289 Other specified noninfective gastroenteritis and colitis: Secondary | ICD-10-CM | POA: Insufficient documentation

## 2011-10-13 DIAGNOSIS — I10 Essential (primary) hypertension: Secondary | ICD-10-CM | POA: Insufficient documentation

## 2011-10-13 DIAGNOSIS — K921 Melena: Secondary | ICD-10-CM | POA: Insufficient documentation

## 2011-10-13 DIAGNOSIS — I252 Old myocardial infarction: Secondary | ICD-10-CM | POA: Insufficient documentation

## 2011-10-13 DIAGNOSIS — Z79899 Other long term (current) drug therapy: Secondary | ICD-10-CM | POA: Insufficient documentation

## 2011-10-13 DIAGNOSIS — R110 Nausea: Secondary | ICD-10-CM | POA: Insufficient documentation

## 2011-10-13 LAB — COMPREHENSIVE METABOLIC PANEL
Alkaline Phosphatase: 96 U/L (ref 39–117)
BUN: 7 mg/dL (ref 6–23)
CO2: 24 mEq/L (ref 19–32)
Chloride: 108 mEq/L (ref 96–112)
Creatinine, Ser: 0.61 mg/dL (ref 0.50–1.10)
GFR calc non Af Amer: >90 mL/min (ref >90)
Total Bilirubin: 0.2 mg/dL — ABNORMAL LOW (ref 0.3–1.2)

## 2011-10-13 LAB — URINE MICROSCOPIC-ADD ON

## 2011-10-13 LAB — URINALYSIS, ROUTINE W REFLEX MICROSCOPIC
Glucose, UA: 100 mg/dL — AB
Specific Gravity, Urine: 1.024 (ref 1.005–1.030)
pH: 6 (ref 5.0–8.0)

## 2011-10-13 LAB — CBC
HCT: 43.8 % (ref 36.0–46.0)
Hemoglobin: 15.1 g/dL — ABNORMAL HIGH (ref 12.0–15.0)
MCV: 93.6 fL (ref 78.0–100.0)
RBC: 4.68 MIL/uL (ref 3.87–5.11)
WBC: 9.2 10*3/uL (ref 4.0–10.5)

## 2011-10-13 MED ORDER — SODIUM CHLORIDE 0.9 % IV SOLN: Freq: Once | INTRAVENOUS | Status: DC

## 2011-10-13 NOTE — ED Notes (Signed)
C/o rectal bleeding and abd pain since 3am.

## 2011-10-13 NOTE — ED Provider Notes (Signed)
History     CSN: [id number]  Arrival date & time 10/13/11  2148   First MD Initiated Contact with Patient 10/13/11 2336      Chief Complaint  Patient presents with  . Rectal Bleeding    (Consider location/radiation/quality/duration/timing/severity/associated sxs/prior treatment) HPI Comments: 61 year old female with a history of GERD, hiatal hernia or, hypertension, ischemic colitis of unknown etiology, chronic kidney disease and myocardial infarction. She presents to the hospital with 2 complaints, #1 is rectal bleeding, number to his hematuria and pelvic pain.  Rectal bleeding the patient states that she is in a very small amount of blood in her stools over the last 6 months which is described as dark stools, sometimes with bright red blood. Over the last several days she's had increased amounts of blood in her stools over the last 24 hours she has had multiple bloody bowel movements which she describes as loose brown to black stools with small amounts of bright red blood and clot. She has had crampy abdominal pain this morning which has been relieved as the day has gone on but still is persistent in a mild form. She denies fevers, chills, vomiting, but does have nausea. There is no rashes, swelling, sore throat. She does have generalized fatigue and lightheadedness and is currently being evaluated by her family Dr. for the symptoms. In fact she was seen at her family Dr. today and had a rectal exam as well as laboratory data that she brought with her showing a normal hemoglobin and hematuria on her urinalysis. She was referred to the emergency department should her symptoms continue.  Pelvic pain seems to be chronic, she's had this daily for some time but has increased cramping today. Her hematuria is also not a new problem and today when seen at her family doctor's office she was referred to gynecology for further evaluation.  Patient is a 61 y.o. female presenting with hematochezia. The  history is provided by the patient.  Rectal Bleeding     Past Medical History  Diagnosis Date  . Tinnitus   . Dysphagia   . Sensorineural hearing loss   . GERD (gastroesophageal reflux disease)   . Hiatal hernia   . 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 Hypertension   . Fibromyalgia   . Colitis, ischemic   . Allergy   . Anxiety   . Arthritis   . Depression   . Hyperlipidemia   . 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 Myocardial infarction   . Hiatal hernia     Past Surgical History  Procedure Date  . Bladder surgery   . Breast surgery   . Abdominal hysterectomy   . Neck surgery     cyst on neck  . Pillonidal cyst     resection  . Carple tunnal  surgery     Right hand    Family History  Problem Relation Age of Onset  . Anemia    . Cancer    . Diabetes    . Sleep apnea    . Colon cancer Neg Hx   . Esophageal cancer Neg Hx   . Stomach cancer Neg Hx     History  Substance Use Topics  . Smoking status: Current Everyday Smoker -- 0.5 packs/day  . Smokeless tobacco: Never Used   Comment: gave pt a sheet  . Alcohol Use: No    OB History    Grav Para Term Preterm Abortions  TAB SAB Ect Mult Living                  Review of Systems  Gastrointestinal: Positive for hematochezia.  All other systems reviewed and are negative.    Allergies  Bupropion hcl; Cephalexin; Penicillins; and Darvocet  Home Medications   Current Outpatient Rx  Name Route Sig Dispense Refill  . ALBUTEROL SULFATE HFA 108 (90 BASE) MCG/ACT IN AERS Inhalation Inhale 2 puffs into the lungs every 6 (six) hours as needed for wheezing. Please dispense 3 month supply 1 Inhaler 3  . VITAMIN D 1000 UNITS PO TABS Oral Take 1,000 Units by mouth daily.    Marland Kitchen CLOBETASOL PROPIONATE 0.05 % EX OINT Topical Apply topically 2 (two) times daily.      . CYCLOBENZAPRINE HCL 10 MG PO TABS Oral Take 10 mg by mouth every evening.    Marland Kitchen DIAZEPAM 5 MG PO TABS Oral Take 5 mg by  mouth 3 (three) times daily.    Marland Kitchen ESOMEPRAZOLE MAGNESIUM 40 MG PO CPDR Oral Take 1 capsule (40 mg total) by mouth daily. 30 capsule 5  . ESTROGENS CONJUGATED 0.625 MG PO TABS Oral Take 0.625 mg by mouth daily. As needed     . HYDROCODONE-ACETAMINOPHEN 5-325 MG PO TABS Oral Take 1 tablet by mouth every 6 (six) hours as needed. For pain    . LORATADINE 10 MG PO TABS Oral Take 10 mg by mouth daily.    . MOMETASONE FUROATE 50 MCG/ACT NA SUSP Nasal Place 2 sprays into the nose daily. As needed     . SERTRALINE HCL 50 MG PO TABS Oral Take 1 tablet (50 mg total) by mouth daily. 30 tablet 3  . CIPROFLOXACIN HCL 500 MG PO TABS Oral Take 1 tablet (500 mg total) by mouth every 12 (twelve) hours. 20 tablet 0  . HYDROCODONE-ACETAMINOPHEN 5-500 MG PO TABS Oral Take 1-2 tablets by mouth every 6 (six) hours as needed for pain. 15 tablet 0  . METRONIDAZOLE 500 MG PO TABS Oral Take 1 tablet (500 mg total) by mouth 2 (two) times daily. 20 tablet 0  . PROMETHAZINE HCL 25 MG PO TABS Oral Take 1 tablet (25 mg total) by mouth every 6 (six) hours as needed for nausea. 12 tablet 0    BP 139/75  Pulse 63  Temp 97.1 F (36.2 C)  Resp 16  SpO2 96%  Physical Exam  Nursing note and vitals reviewed. Constitutional: She appears well-developed and well-nourished. No distress.  HENT:  Head: Normocephalic and atraumatic.  Mouth/Throat: Oropharynx is clear and moist. No oropharyngeal exudate.  Eyes: Conjunctivae and EOM are normal. Pupils are equal, round, and reactive to light. Right eye exhibits no discharge. Left eye exhibits no discharge. No scleral icterus.  Neck: Normal range of motion. Neck supple. No JVD present. No thyromegaly present.  Cardiovascular: Normal rate, regular rhythm, normal heart sounds and intact distal pulses.  Exam reveals no gallop and no friction rub.   No murmur heard. Pulmonary/Chest: Effort normal and breath sounds normal. No respiratory distress. She has no wheezes. She has no rales.    Abdominal: Soft. Bowel sounds are normal. She exhibits no distension and no mass. There is tenderness ( Mild to moderate tenderness in the right lower and suprapubic areas, no guarding, no rebound, non-peritoneal, minimal left-sided tenderness, no upper abdominal tenderness.).  Musculoskeletal: Normal range of motion. She exhibits no edema and no tenderness.  Lymphadenopathy:    She has no cervical adenopathy.  Neurological: She is alert. Coordination normal.  Skin: Skin is warm and dry. No rash noted. No erythema.  Psychiatric: She has a normal mood and affect. Her behavior is normal.    ED Course  Procedures (including critical care time)  Labs Reviewed  URINALYSIS, ROUTINE W REFLEX MICROSCOPIC - Abnormal; Notable for the following:    Glucose, UA 100 (*)     Hgb urine dipstick MODERATE (*)     All other components within normal limits  CBC - Abnormal; Notable for the following:    Hemoglobin 15.1 (*)     All other components within normal limits  COMPREHENSIVE METABOLIC PANEL - Abnormal; Notable for the following:    Total Bilirubin 0.2 (*)     All other components within normal limits  URINE MICROSCOPIC-ADD ON - Abnormal; Notable for the following:    Bacteria, UA FEW (*)     Crystals CA OXALATE CRYSTALS (*)     All other components within normal limits  COMPREHENSIVE METABOLIC PANEL - Abnormal; Notable for the following:    Total Bilirubin 0.2 (*)     All other components within normal limits  URINALYSIS, ROUTINE W REFLEX MICROSCOPIC - Abnormal; Notable for the following:    Hgb urine dipstick SMALL (*)     All other components within normal limits  CBC WITH DIFFERENTIAL  TYPE AND SCREEN  OCCULT BLOOD, POC DEVICE  URINE MICROSCOPIC-ADD ON  OCCULT BLOOD X 1 CARD TO LAB, STOOL  ABO/RH   Ct Abdomen Pelvis W Contrast  10/14/2011  *RADIOLOGY REPORT*  Clinical Data: Abdominal pain and rectal bleeding.  CT ABDOMEN AND PELVIS WITH CONTRAST  Technique:  Multidetector CT imaging  of the abdomen and pelvis was performed following the standard protocol during bolus administration of intravenous contrast.  Contrast: 171m OMNIPAQUE IOHEXOL 300 MG/ML  SOLN  Comparison: CT of the abdomen and pelvis performed 11/08/2006  Findings: Minimal bibasilar atelectasis is noted.  The liver and spleen are unremarkable in appearance.  The gallbladder is within normal limits.  The pancreas and adrenal glands are unremarkable.  Scattered hypodensities are noted within both kidneys, measuring up to 1.7 cm in size, compatible with small bilateral renal cysts. The kidneys are otherwise unremarkable in appearance.  There is no evidence of hydronephrosis.  No renal or ureteral stones are identified.  No free fluid is identified.  The small bowel is unremarkable in appearance.  The stomach is within normal limits.  No acute vascular abnormalities are seen.  Diffuse calcification is noted along the abdominal aorta and its branches.  A few areas of wall thickening are noted along the distal descending and proximal to mid sigmoid colon, concerning for mild colitis.  No significant associated soft tissue inflammation is seen.  Contrast progresses to the level of the descending colon; the remaining colon is grossly unremarkable.  The appendix is normal in caliber, without evidence for appendicitis.  The bladder is mildly distended and grossly unremarkable.  The patient is status post hysterectomy; no suspicious adnexal masses are identified.  No inguinal lymphadenopathy is seen.  No acute osseous abnormalities are identified.  Mild facet disease is noted along the lumbar spine.  There is mild grade 1 retrolisthesis of L2 on L3.  IMPRESSION:  1.  Focal areas of wall thickening noted along the distal descending and proximal to mid sigmoid colon, concerning for mild colitis.  No evidence of perforation or abscess formation. 2.  Likely small bilateral renal cysts. 3.  Diffuse calcification along the  abdominal aorta and its  branches.  Original Report Authenticated By: Santa Lighter, M.D.     1. Colitis       MDM  Patient with rectal bleeding and abdominal discomfort, will recheck hemoglobin, rectal exam, labs for further evaluation. Hemoccult pending  Rectal with no stool in rectal vault - hem positive  Lab studies reviewed, normal CBC, normal metabolic panel, normal urinalysis, CT scan reviewed showing small bilateral renal cysts and a mild patchy colitis. I described these findings to the patient and have encouraged her to followup with her family doctor to let them know the results and to seek further evaluation care. She has agreed to take oral antibiotics for presumed infectious colitis and return to the hospital should her symptoms worsen. On repeat exam the patient states that she feels better and has an ongoing soft abdomen.  Discharge Prescriptions include:  Phenergan Hydrocodone Cipro Flagyl     Johnna Acosta, MD 10/14/11 [phone]

## 2011-10-14 ENCOUNTER — Emergency Department (HOSPITAL_COMMUNITY): Payer: No Typology Code available for payment source

## 2011-10-14 LAB — COMPREHENSIVE METABOLIC PANEL WITH GFR
ALT: 18 U/L (ref 0–35)
AST: 27 U/L (ref 0–37)
Albumin: 4 g/dL (ref 3.5–5.2)
Alkaline Phosphatase: 96 U/L (ref 39–117)
BUN: 7 mg/dL (ref 6–23)
CO2: 25 meq/L (ref 19–32)
Calcium: 9.6 mg/dL (ref 8.4–10.5)
Chloride: 106 meq/L (ref 96–112)
Creatinine, Ser: 0.6 mg/dL (ref 0.50–1.10)
GFR calc Af Amer: >90 mL/min
GFR calc non Af Amer: >90 mL/min
Glucose, Bld: 84 mg/dL (ref 70–99)
Potassium: 3.6 meq/L (ref 3.5–5.1)
Sodium: 141 meq/L (ref 135–145)
Total Bilirubin: 0.2 mg/dL — ABNORMAL LOW (ref 0.3–1.2)
Total Protein: 7 g/dL (ref 6.0–8.3)

## 2011-10-14 LAB — TYPE AND SCREEN
ABO/RH(D): O POS
Antibody Screen: NEGATIVE

## 2011-10-14 LAB — URINALYSIS, ROUTINE W REFLEX MICROSCOPIC
Bilirubin Urine: NEGATIVE
Glucose, UA: NEGATIVE mg/dL
Ketones, ur: NEGATIVE mg/dL
Protein, ur: NEGATIVE mg/dL
Urobilinogen, UA: 0.2 mg/dL (ref 0.0–1.0)

## 2011-10-14 LAB — URINE MICROSCOPIC-ADD ON

## 2011-10-14 LAB — CBC WITH DIFFERENTIAL/PLATELET
Basophils Absolute: 0.1 10*3/uL (ref 0.0–0.1)
Eosinophils Relative: 4 % (ref 0–5)
HCT: 43.4 % (ref 36.0–46.0)
Hemoglobin: 14.8 g/dL (ref 12.0–15.0)
Lymphocytes Relative: 32 % (ref 12–46)
MCV: 93.9 fL (ref 78.0–100.0)
Monocytes Absolute: 0.9 10*3/uL (ref 0.1–1.0)
Monocytes Relative: 9 % (ref 3–12)
RDW: 13.8 % (ref 11.5–15.5)
WBC: 9.2 10*3/uL (ref 4.0–10.5)

## 2011-10-14 LAB — ABO/RH: ABO/RH(D): O POS

## 2011-10-14 LAB — OCCULT BLOOD, POC DEVICE: Fecal Occult Bld: POSITIVE

## 2011-10-14 MED ORDER — IOHEXOL 300 MG/ML  SOLN
100.0000 mL | Freq: Once | INTRAMUSCULAR | Status: AC | PRN
  Administered 2011-10-14: 100 mL via INTRAVENOUS

## 2011-10-14 MED ORDER — HYDROCODONE-ACETAMINOPHEN 5-500 MG PO TABS: 1.0000 | ORAL_TABLET | Freq: Four times a day (QID) | ORAL | Status: AC | PRN

## 2011-10-14 MED ORDER — CIPROFLOXACIN HCL 500 MG PO TABS: 500.0000 mg | ORAL_TABLET | Freq: Two times a day (BID) | ORAL | Status: AC

## 2011-10-14 MED ORDER — METRONIDAZOLE 500 MG PO TABS: 500.0000 mg | ORAL_TABLET | Freq: Two times a day (BID) | ORAL | Status: AC

## 2011-10-14 MED ORDER — CIPROFLOXACIN HCL 500 MG PO TABS
500.0000 mg | ORAL_TABLET | Freq: Once | ORAL | Status: AC
  Administered 2011-10-14: 500 mg via ORAL
  Filled 2011-10-14: qty 1

## 2011-10-14 MED ORDER — METRONIDAZOLE 500 MG PO TABS
500.0000 mg | ORAL_TABLET | Freq: Once | ORAL | Status: AC
  Administered 2011-10-14: 500 mg via ORAL
  Filled 2011-10-14: qty 1

## 2011-10-14 MED ORDER — PROMETHAZINE HCL 25 MG PO TABS: 25.0000 mg | ORAL_TABLET | Freq: Four times a day (QID) | ORAL | Status: DC | PRN

## 2011-10-14 NOTE — ED Notes (Signed)
Pt states she has had rectal bleeding since 3 am yesterday morning.  Denies n/v.

## 2013-01-27 IMAGING — CT CT ABD-PELV W/ CM
2 of 5 series · 16 of 46 positions shown, 18 images · IV contrast (APPLIED)
Comparison: CT of the abdomen and pelvis performed 11/08/2006

CLINICAL DATA: Abdominal pain and rectal bleeding.

CT ABDOMEN AND PELVIS WITH CONTRAST
TECHNIQUE: Multidetector CT imaging of the abdomen and pelvis was
performed following the standard protocol during bolus
administration of intravenous contrast.
Contrast: 100mL OMNIPAQUE IOHEXOL 300 MG/ML  SOLN

[Series 2: abd/pelv with 5.0 b31f st · axial · 0.70mm/px · z∈[-192,+223]mm · 13 of 93 slices shown, 15 images]
[im 5/93  soft-tissue]
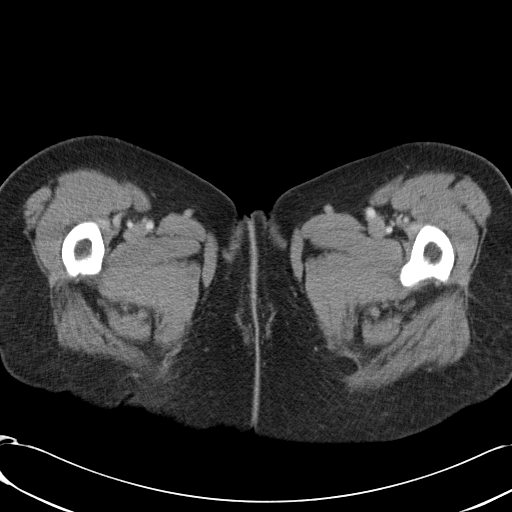
[im 5/93  bone]
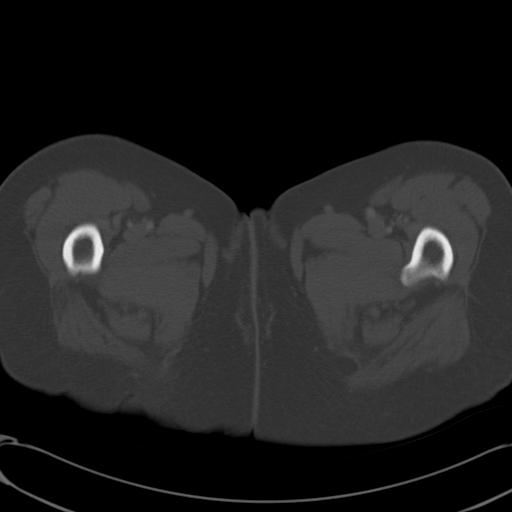
[im 14/93  soft-tissue]
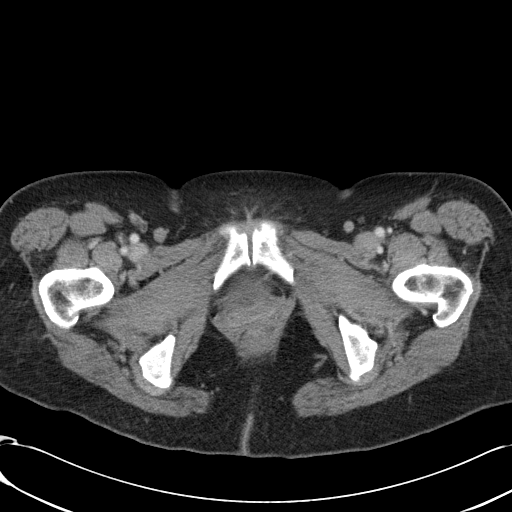
[im 19/93  soft-tissue]
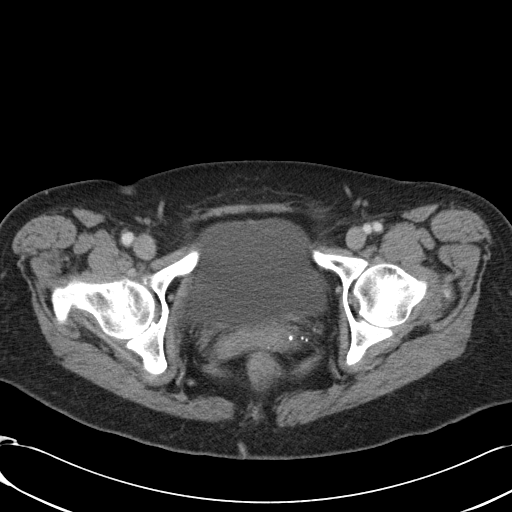
[im 28/93  soft-tissue]
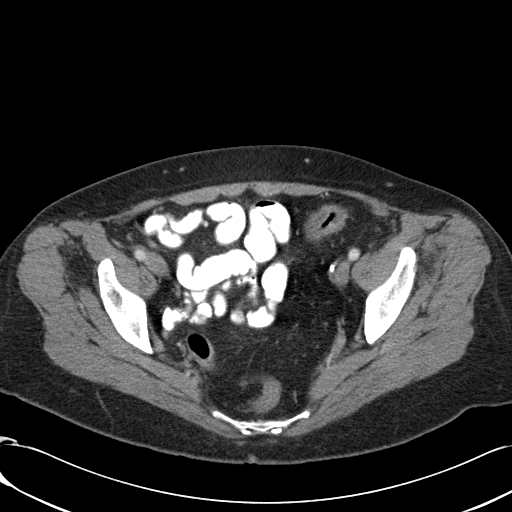
[im 33/93  soft-tissue]
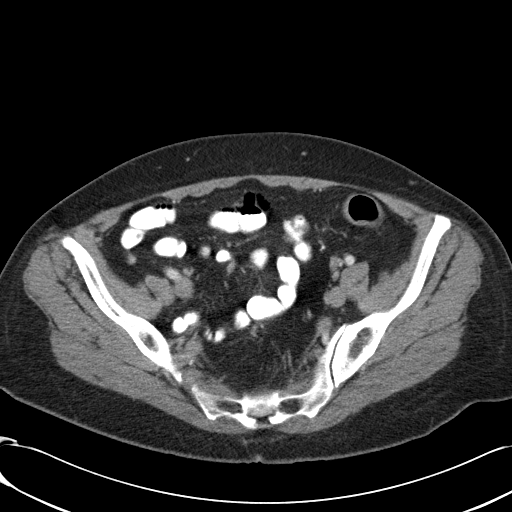
[im 42/93  soft-tissue]
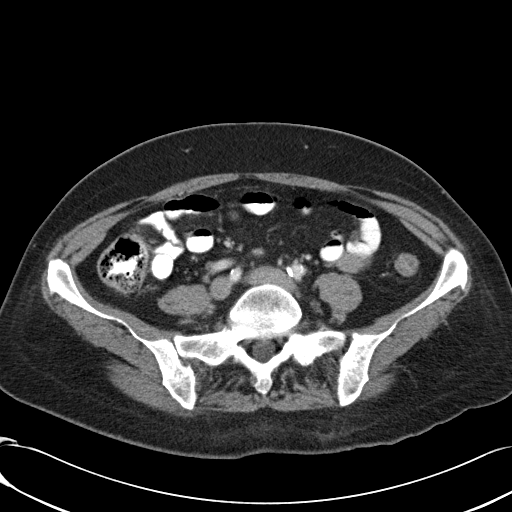
[im 47/93  soft-tissue]
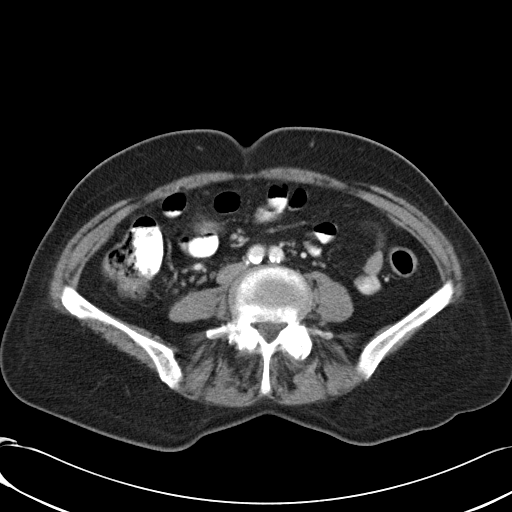
[im 51/93  soft-tissue]
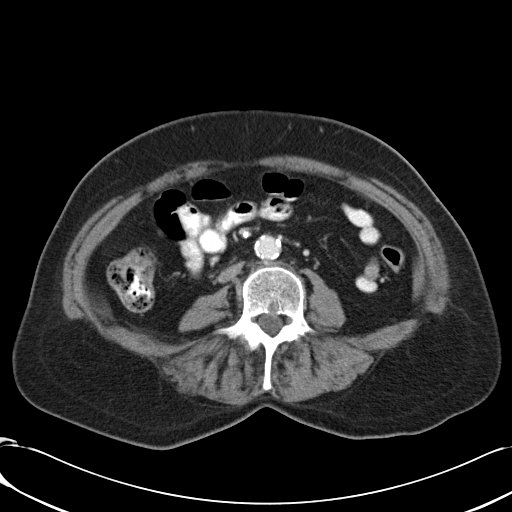
[im 60/93  soft-tissue]
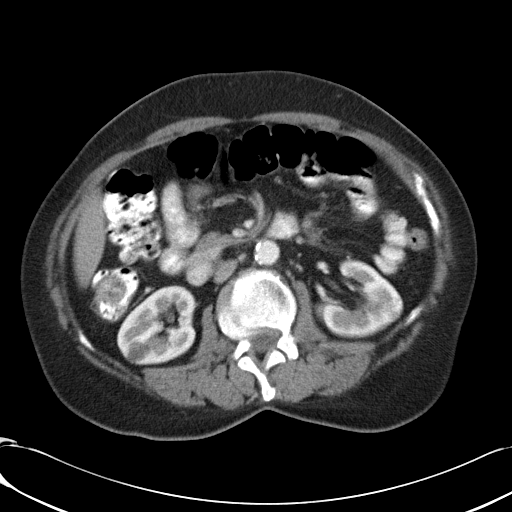
[im 60/93  bone]
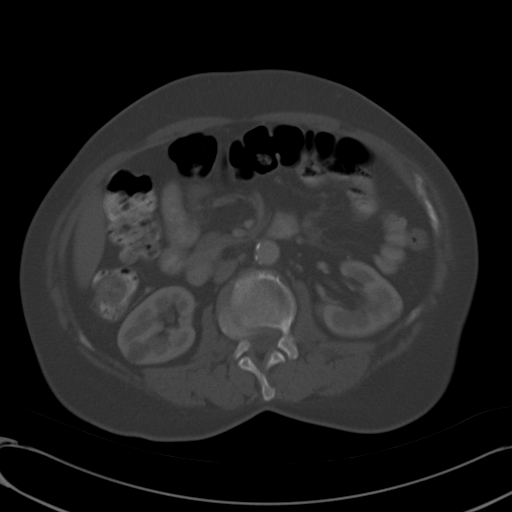
[im 65/93  soft-tissue]
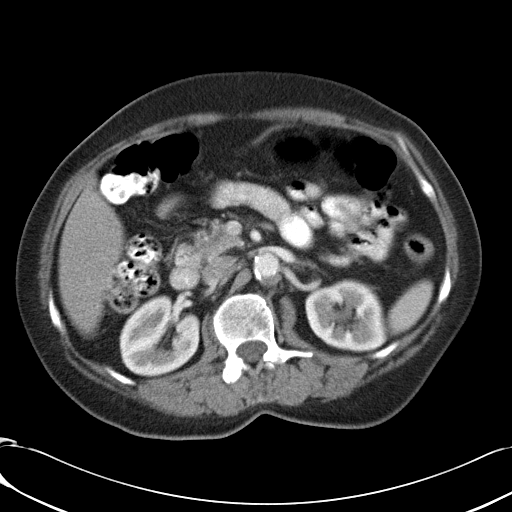
[im 74/93  soft-tissue]
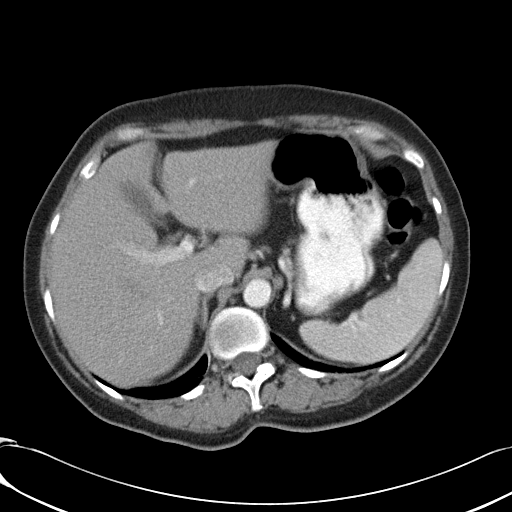
[im 79/93  soft-tissue]
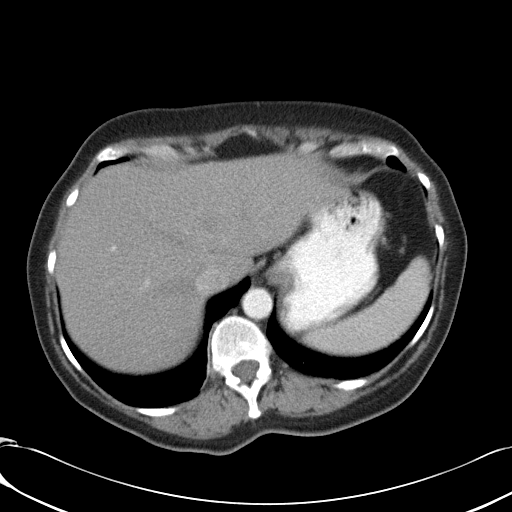
[im 88/93  soft-tissue]
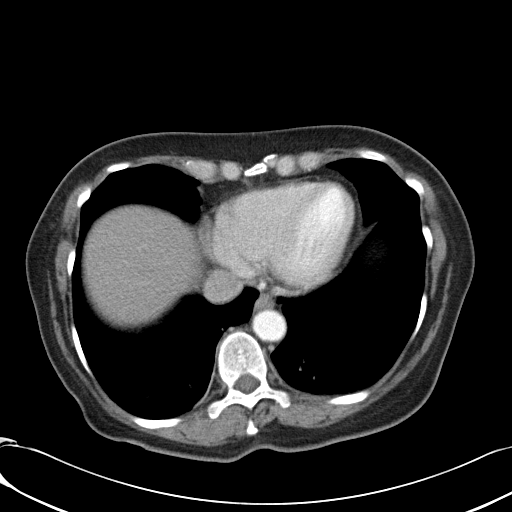

[Series 602: coronals · coronal · 0.91mm/px · 3 of 104 slices shown]
[im 35/104  soft-tissue]
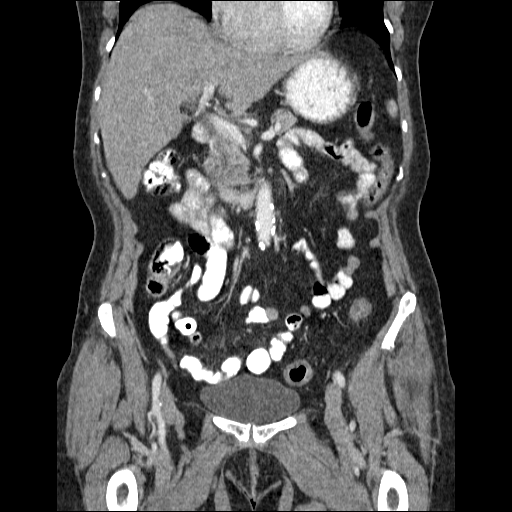
[im 46/104  soft-tissue]
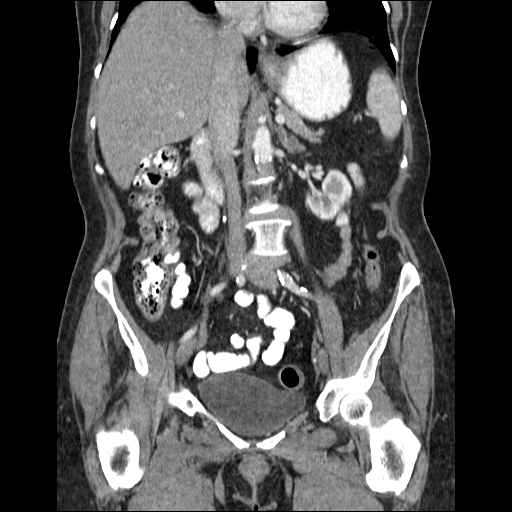
[im 58/104  soft-tissue]
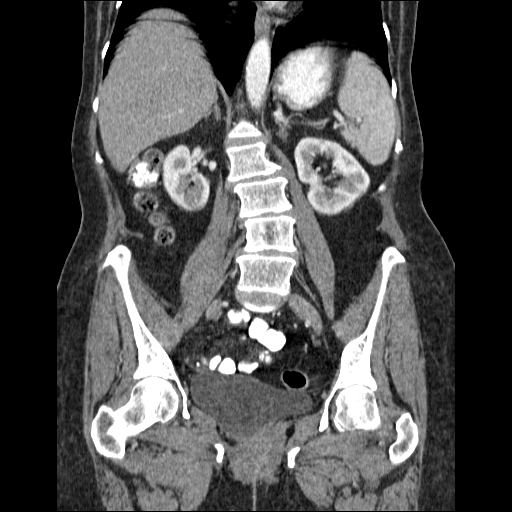

[16 of 46 positions shown; findings below may reference images not displayed]

FINDINGS: Minimal bibasilar atelectasis is noted.

The liver and spleen are unremarkable in appearance.  The
gallbladder is within normal limits.  The pancreas and adrenal
glands are unremarkable.

Scattered hypodensities are noted within both kidneys, measuring up
to 1.7 cm in size, compatible with small bilateral renal cysts.
The kidneys are otherwise unremarkable in appearance.  There is no
evidence of hydronephrosis.  No renal or ureteral stones are
identified.

No free fluid is identified.  The small bowel is unremarkable in
appearance.  The stomach is within normal limits.  No acute
vascular abnormalities are seen.  Diffuse calcification is noted
along the abdominal aorta and its branches.

A few areas of wall thickening are noted along the distal
descending and proximal to mid sigmoid colon, concerning for mild
colitis.  No significant associated soft tissue inflammation is
seen.  Contrast progresses to the level of the descending colon;
the remaining colon is grossly unremarkable.

The appendix is normal in caliber, without evidence for
appendicitis.

The bladder is mildly distended and grossly unremarkable.  The
patient is status post hysterectomy; no suspicious adnexal masses
are identified.  No inguinal lymphadenopathy is seen.

No acute osseous abnormalities are identified.  Mild facet disease
is noted along the lumbar spine.  There is mild grade 1
retrolisthesis of L2 on L3.
IMPRESSION: 1.  Focal areas of wall thickening noted along the distal
descending and proximal to mid sigmoid colon, concerning for mild
colitis.  No evidence of perforation or abscess formation.
2.  Likely small bilateral renal cysts.
3.  Diffuse calcification along the abdominal aorta and its
branches.

## 2014-03-31 ENCOUNTER — Emergency Department (HOSPITAL_COMMUNITY)
Admission: EM | Admit: 2014-03-31 | Discharge: 2014-03-31 | Disposition: A | Discharge status: Home / Self Care | Payer: BC Managed Care – PPO | Attending: Emergency Medicine | Admitting: Emergency Medicine

## 2014-03-31 ENCOUNTER — Emergency Department (HOSPITAL_COMMUNITY): Payer: BC Managed Care – PPO

## 2014-03-31 ENCOUNTER — Encounter (HOSPITAL_COMMUNITY): Payer: Self-pay | Admitting: Cardiology

## 2014-03-31 DIAGNOSIS — J0100 Acute maxillary sinusitis, unspecified: Secondary | ICD-10-CM | POA: Insufficient documentation

## 2014-03-31 DIAGNOSIS — R519 Headache, unspecified: Secondary | ICD-10-CM

## 2014-03-31 DIAGNOSIS — N189 Chronic kidney disease, unspecified: Secondary | ICD-10-CM | POA: Diagnosis not present

## 2014-03-31 DIAGNOSIS — Z720 Tobacco use: Secondary | ICD-10-CM | POA: Diagnosis not present

## 2014-03-31 DIAGNOSIS — E785 Hyperlipidemia, unspecified: Secondary | ICD-10-CM | POA: Insufficient documentation

## 2014-03-31 DIAGNOSIS — Z79899 Other long term (current) drug therapy: Secondary | ICD-10-CM | POA: Diagnosis not present

## 2014-03-31 DIAGNOSIS — Z7951 Long term (current) use of inhaled steroids: Secondary | ICD-10-CM | POA: Insufficient documentation

## 2014-03-31 DIAGNOSIS — M1990 Unspecified osteoarthritis, unspecified site: Secondary | ICD-10-CM | POA: Diagnosis not present

## 2014-03-31 DIAGNOSIS — I129 Hypertensive chronic kidney disease with stage 1 through stage 4 chronic kidney disease, or unspecified chronic kidney disease: Secondary | ICD-10-CM | POA: Diagnosis not present

## 2014-03-31 DIAGNOSIS — R51 Headache: Secondary | ICD-10-CM

## 2014-03-31 DIAGNOSIS — F329 Major depressive disorder, single episode, unspecified: Secondary | ICD-10-CM | POA: Diagnosis not present

## 2014-03-31 DIAGNOSIS — F419 Anxiety disorder, unspecified: Secondary | ICD-10-CM | POA: Insufficient documentation

## 2014-03-31 DIAGNOSIS — K219 Gastro-esophageal reflux disease without esophagitis: Secondary | ICD-10-CM | POA: Insufficient documentation

## 2014-03-31 DIAGNOSIS — I252 Old myocardial infarction: Secondary | ICD-10-CM | POA: Insufficient documentation

## 2014-03-31 DIAGNOSIS — Z880 Allergy status to penicillin: Secondary | ICD-10-CM | POA: Insufficient documentation

## 2014-03-31 LAB — BASIC METABOLIC PANEL
ANION GAP: 7 (ref 5–15)
BUN: <5 mg/dL — ABNORMAL LOW (ref 6–23)
CHLORIDE: 106 meq/L (ref 96–112)
CO2: 26 mmol/L (ref 19–32)
Calcium: 9.2 mg/dL (ref 8.4–10.5)
Creatinine, Ser: 0.6 mg/dL (ref 0.50–1.10)
GFR calc Af Amer: >90 mL/min (ref >90)
GFR calc non Af Amer: >90 mL/min (ref >90)
GLUCOSE: 88 mg/dL (ref 70–99)
Potassium: 3.8 mmol/L (ref 3.5–5.1)
SODIUM: 139 mmol/L (ref 135–145)

## 2014-03-31 LAB — CBC WITH DIFFERENTIAL/PLATELET
BASOS ABS: 0 10*3/uL (ref 0.0–0.1)
Basophils Relative: 1 % (ref 0–1)
Eosinophils Absolute: 0.3 10*3/uL (ref 0.0–0.7)
Eosinophils Relative: 5 % (ref 0–5)
HCT: 43 % (ref 36.0–46.0)
Hemoglobin: 14.3 g/dL (ref 12.0–15.0)
LYMPHS ABS: 1.2 10*3/uL (ref 0.7–4.0)
LYMPHS PCT: 21 % (ref 12–46)
MCH: 31.2 pg (ref 26.0–34.0)
MCHC: 33.3 g/dL (ref 30.0–36.0)
MCV: 93.7 fL (ref 78.0–100.0)
Monocytes Absolute: 0.7 10*3/uL (ref 0.1–1.0)
Monocytes Relative: 13 % — ABNORMAL HIGH (ref 3–12)
NEUTROS ABS: 3.2 10*3/uL (ref 1.7–7.7)
Neutrophils Relative %: 60 % (ref 43–77)
Platelets: 182 10*3/uL (ref 150–400)
RBC: 4.59 MIL/uL (ref 3.87–5.11)
RDW: 13.2 % (ref 11.5–15.5)
WBC: 5.4 10*3/uL (ref 4.0–10.5)

## 2014-03-31 MED ORDER — SODIUM CHLORIDE 0.9 % IV BOLUS (SEPSIS)
1000.0000 mL | Freq: Once | INTRAVENOUS | Status: AC
  Administered 2014-03-31: 1000 mL via INTRAVENOUS

## 2014-03-31 MED ORDER — DIPHENHYDRAMINE HCL 50 MG/ML IJ SOLN
25.0000 mg | Freq: Once | INTRAMUSCULAR | Status: AC
  Administered 2014-03-31: 25 mg via INTRAVENOUS
  Filled 2014-03-31: qty 1

## 2014-03-31 MED ORDER — KETOROLAC TROMETHAMINE 30 MG/ML IJ SOLN
30.0000 mg | Freq: Once | INTRAMUSCULAR | Status: AC
  Administered 2014-03-31: 30 mg via INTRAVENOUS
  Filled 2014-03-31: qty 1

## 2014-03-31 MED ORDER — SULFAMETHOXAZOLE-TRIMETHOPRIM 800-160 MG PO TABS: 1.0000 | ORAL_TABLET | Freq: Two times a day (BID) | ORAL | Status: DC

## 2014-03-31 MED ORDER — METOCLOPRAMIDE HCL 5 MG/ML IJ SOLN
10.0000 mg | INTRAMUSCULAR | Status: AC
  Administered 2014-03-31: 10 mg via INTRAVENOUS
  Filled 2014-03-31: qty 2

## 2014-03-31 NOTE — ED Notes (Signed)
Pt reports that she has had a headache for the past couple of months. Reports that she has been seen at [facility] but is not feeling any better. Reports that she also has a head cold for the past couple of days.

## 2014-03-31 NOTE — ED Notes (Signed)
Pt given soda and crackers on discharge.

## 2014-03-31 NOTE — ED Provider Notes (Signed)
CSN: [id number]     Arrival date & time 03/31/14  1057 History   First MD Initiated Contact with Patient 03/31/14 1118     Chief Complaint  Patient presents with  . Headache   Patient is a 63 y.o. female presenting with headaches. The history is provided by the patient. No language interpreter was used.  Headache Associated symptoms: eye pain   Associated symptoms: no numbness   This chart was scribed for non-physician practitioner Alvina Chou, PA-C,  working with Veryl Speak, MD, by Thea Alken, ED Scribe. This patient was seen in room TR11C/TR11C and the patient's care was started at 11:19 AM.  QUATISHA ZYLKA is a 63 y.o. female who presents to the Emergency Department complaining of a steady HA for a several months. Pt reports HA's rotate around head with associated eye pain. Pt states she typically goes to sleep when she has a HA and has tried tylenol but causes her to see black spots.  Pt reports blurry, spotty visual changes within the last 24 hours. Pt states she was seen in the past for similar symptoms and states she has been told she needed an MRI multiple times. Pt reports hx of blackouts due to inner ear problems and has been seen by ENT but denies LOC in the last 24 hours.  Pt is a retired Marine scientist.    Past Medical History  Diagnosis Date  . Tinnitus   . Dysphagia   . Sensorineural hearing loss   . GERD (gastroesophageal reflux disease)   . Hiatal hernia   . 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 Hypertension   . Fibromyalgia   . Colitis, ischemic   . Allergy   . Anxiety   . Arthritis   . Depression   . Hyperlipidemia   . 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 Myocardial infarction   . Hiatal hernia    Past Surgical History  Procedure Laterality Date  . Bladder surgery    . Breast surgery    . Abdominal hysterectomy    . Neck surgery      cyst on neck  . Pillonidal cyst      resection  . Carple tunnal  surgery      Right  hand   Family History  Problem Relation Age of Onset  . Anemia    . Cancer    . Diabetes    . Sleep apnea    . Colon cancer Neg Hx   . Esophageal cancer Neg Hx   . Stomach cancer Neg Hx    History  Substance Use Topics  . Smoking status: Current Every Day Smoker -- 0.50 packs/day  . Smokeless tobacco: Never Used     Comment: gave pt a sheet  . Alcohol Use: No   OB History    No data available     Review of Systems  Eyes: Positive for pain and visual disturbance.  Neurological: Positive for headaches. Negative for syncope, weakness and numbness.    Allergies  Bupropion hcl; Cephalexin; Penicillins; and Darvocet  Home Medications   Prior to Admission medications   Medication Sig Start Date End Date Taking? Authorizing Provider  albuterol (PROAIR HFA) 108 (90 BASE) MCG/ACT inhaler Inhale 2 puffs into the lungs every 6 (six) hours as needed for wheezing. Please dispense 3 month supply 07/06/11 07/05/12  Sarah T Martinique, MD  cholecalciferol (VITAMIN D) 1000 UNITS tablet Take 1,000 Units by  mouth daily.    Historical Provider, MD  clobetasol ointment (TEMOVATE) 0.05 % Apply topically 2 (two) times daily.      Historical Provider, MD  cyclobenzaprine (FLEXERIL) 10 MG tablet Take 10 mg by mouth every evening.    Historical Provider, MD  diazepam (VALIUM) 5 MG tablet Take 5 mg by mouth 3 (three) times daily.    Historical Provider, MD  esomeprazole (NEXIUM) 40 MG capsule Take 1 capsule (40 mg total) by mouth daily. 05/05/11 05/04/12  Jerene Bears, MD  estrogens, conjugated, (PREMARIN) 0.625 MG tablet Take 0.625 mg by mouth daily. As needed     Historical Provider, MD  HYDROcodone-acetaminophen (NORCO) 5-325 MG per tablet Take 1 tablet by mouth every 6 (six) hours as needed. For pain    Historical Provider, MD  loratadine (CLARITIN) 10 MG tablet Take 10 mg by mouth daily.    Historical Provider, MD  mometasone (NASONEX) 50 MCG/ACT nasal spray Place 2 sprays into the nose daily. As needed   02/09/11 02/09/12  Sarah T Martinique, MD  promethazine (PHENERGAN) 25 MG tablet Take 1 tablet (25 mg total) by mouth every 6 (six) hours as needed for nausea. 10/14/11 10/21/11  Johnna Acosta, MD  sertraline (ZOLOFT) 50 MG tablet Take 1 tablet (50 mg total) by mouth daily. 07/31/11   Sarah T Martinique, MD   BP 138/77 mmHg  Pulse 95  Temp(Src) 98 F (36.7 C) (Oral)  Resp 18  Ht 5' 7"  (1.702 m)  Wt 159 lb (72.122 kg)  BMI 24.90 kg/m2  SpO2 99% Physical Exam  Constitutional: She is oriented to person, place, and time. She appears well-developed and well-nourished. No distress.  HENT:  Head: Normocephalic and atraumatic.  Mouth/Throat: Oropharynx is clear and moist. No oropharyngeal exudate.  Eyes: Conjunctivae and EOM are normal. Pupils are equal, round, and reactive to light.  Neck: Normal range of motion. Neck supple.  Cardiovascular: Normal rate.   Pulmonary/Chest: Effort normal. No respiratory distress. She has no wheezes. She has no rales.  Abdominal: Soft. She exhibits no distension. There is no tenderness. There is no rebound.  Musculoskeletal: Normal range of motion.  Neurological: She is alert and oriented to person, place, and time. No cranial nerve deficit. Coordination normal.  Extremity strength and sensation equal and intact bilaterally.   Skin: Skin is warm and dry.  Psychiatric: She has a normal mood and affect. Her behavior is normal.  Nursing note and vitals reviewed.   ED Course  Procedures  DIAGNOSTIC STUDIES: Oxygen Saturation is 99% on RA, normal by my interpretation.    COORDINATION OF CARE: 11:53 AM- Pt advised of plan for treatment and pt agrees.  Labs Review Labs Reviewed  CBC WITH DIFFERENTIAL - Abnormal; Notable for the following:    Monocytes Relative 13 (*)    All other components within normal limits  BASIC METABOLIC PANEL - Abnormal; Notable for the following:    BUN <5 (*)    All other components within normal limits    Imaging Review Ct Head Wo  Contrast  03/31/2014   CLINICAL DATA:  63 year old female with headache at the vertex, intermittent blurred vision. Initial encounter.  EXAM: CT HEAD WITHOUT CONTRAST  TECHNIQUE: Contiguous axial images were obtained from the base of the skull through the vertex without intravenous contrast.  COMPARISON:  [facility] and face CT 03/27/2008.  FINDINGS: Mostly improved paranasal sinus aeration since 2009, but there is a new small fluid level in the visible  right maxillary sinus. Interval improved right mastoid air cell pneumatization. Mastoids and tympanic cavities appear clear.  No acute osseous abnormality identified.  Orbit and scalp soft tissues are within normal limits.  Cerebral volume is within normal limits for age. Mild white matter hypodensity adjacent to the right frontal horn has progressed, elsewhere normal for age gray-white matter differentiation. No midline shift, ventriculomegaly, mass effect, evidence of mass lesion, intracranial hemorrhage or evidence of cortically based acute infarction. Gray-white matter differentiation is within normal limits throughout the brain. No suspicious intracranial vascular hyperdensity.  IMPRESSION: 1. No acute intracranial abnormality. Mild for age nonspecific white matter changes, progressed since 2009. 2. Right maxillary sinus air-fluid level, but otherwise regressed paranasal sinus and right mastoid inflammation since 2009.   Electronically Signed   By: Lars Pinks M.D.   On: 03/31/2014 13:59     EKG Interpretation None      MDM   Final diagnoses:  Headache  Acute maxillary sinusitis, recurrence not specified    2:10 PM Patient's labs unremarkable for acute changes. Patient's head CT shows right maxillary sinusitis without other changes. Patient has no neuro deficits and can ambulate without difficulty. Headache has improved. Patient will be discharged with PCP follow up.   I personally performed the services described in this  documentation, which was scribed in my presence. The recorded information has been reviewed and is accurate.    Alvina Chou, PA-C 03/31/14 1416  Veryl Speak, MD 04/01/14 [phone]

## 2014-03-31 NOTE — ED Notes (Signed)
Pt was sleeping.

## 2014-03-31 NOTE — Discharge Instructions (Signed)
Take Bactrim as directed until gone. Refer to attached documents for more information.

## 2014-05-05 ENCOUNTER — Ambulatory Visit (HOSPITAL_COMMUNITY): Payer: Self-pay | Admitting: Psychiatry

## 2014-06-10 ENCOUNTER — Ambulatory Visit (INDEPENDENT_AMBULATORY_CARE_PROVIDER_SITE_OTHER): Payer: 59 | Admitting: Psychiatry

## 2014-06-10 ENCOUNTER — Encounter (HOSPITAL_COMMUNITY): Payer: Self-pay | Admitting: Psychiatry

## 2014-06-10 VITALS — BP 133/76 | HR 76 | Ht 65.0 in | Wt 144.2 lb

## 2014-06-10 DIAGNOSIS — F411 Generalized anxiety disorder: Secondary | ICD-10-CM

## 2014-06-10 DIAGNOSIS — F331 Major depressive disorder, recurrent, moderate: Secondary | ICD-10-CM

## 2014-06-10 MED ORDER — SERTRALINE HCL 100 MG PO TABS: ORAL_TABLET | ORAL | Status: DC

## 2014-06-10 MED ORDER — DIAZEPAM 5 MG PO TABS: 5.0000 mg | ORAL_TABLET | Freq: Three times a day (TID) | ORAL | Status: DC
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
## 2014-06-13 ENCOUNTER — Emergency Department (HOSPITAL_COMMUNITY)
Admission: EM | Admit: 2014-06-13 | Discharge: 2014-06-13 | Disposition: A | Discharge status: Home / Self Care | Payer: 59 | Attending: Emergency Medicine | Admitting: Emergency Medicine

## 2014-06-13 ENCOUNTER — Encounter (HOSPITAL_COMMUNITY): Payer: Self-pay | Admitting: Emergency Medicine

## 2014-06-13 ENCOUNTER — Emergency Department (HOSPITAL_COMMUNITY): Payer: 59

## 2014-06-13 DIAGNOSIS — M1990 Unspecified osteoarthritis, unspecified site: Secondary | ICD-10-CM | POA: Diagnosis not present

## 2014-06-13 DIAGNOSIS — Z720 Tobacco use: Secondary | ICD-10-CM | POA: Insufficient documentation

## 2014-06-13 DIAGNOSIS — Z90710 Acquired absence of both cervix and uterus: Secondary | ICD-10-CM | POA: Diagnosis not present

## 2014-06-13 DIAGNOSIS — K219 Gastro-esophageal reflux disease without esophagitis: Secondary | ICD-10-CM | POA: Diagnosis not present

## 2014-06-13 DIAGNOSIS — Z7952 Long term (current) use of systemic steroids: Secondary | ICD-10-CM | POA: Insufficient documentation

## 2014-06-13 DIAGNOSIS — N189 Chronic kidney disease, unspecified: Secondary | ICD-10-CM | POA: Diagnosis not present

## 2014-06-13 DIAGNOSIS — H905 Unspecified sensorineural hearing loss: Secondary | ICD-10-CM | POA: Diagnosis not present

## 2014-06-13 DIAGNOSIS — K529 Noninfective gastroenteritis and colitis, unspecified: Secondary | ICD-10-CM | POA: Diagnosis not present

## 2014-06-13 DIAGNOSIS — I129 Hypertensive chronic kidney disease with stage 1 through stage 4 chronic kidney disease, or unspecified chronic kidney disease: Secondary | ICD-10-CM | POA: Insufficient documentation

## 2014-06-13 DIAGNOSIS — I252 Old myocardial infarction: Secondary | ICD-10-CM | POA: Diagnosis not present

## 2014-06-13 DIAGNOSIS — Z8603 Personal history of neoplasm of uncertain behavior: Secondary | ICD-10-CM | POA: Diagnosis not present

## 2014-06-13 DIAGNOSIS — Z79899 Other long term (current) drug therapy: Secondary | ICD-10-CM | POA: Insufficient documentation

## 2014-06-13 DIAGNOSIS — Z8639 Personal history of other endocrine, nutritional and metabolic disease: Secondary | ICD-10-CM | POA: Diagnosis not present

## 2014-06-13 DIAGNOSIS — R1030 Lower abdominal pain, unspecified: Secondary | ICD-10-CM | POA: Diagnosis present

## 2014-06-13 DIAGNOSIS — F329 Major depressive disorder, single episode, unspecified: Secondary | ICD-10-CM | POA: Diagnosis not present

## 2014-06-13 DIAGNOSIS — Z880 Allergy status to penicillin: Secondary | ICD-10-CM | POA: Insufficient documentation

## 2014-06-13 DIAGNOSIS — F419 Anxiety disorder, unspecified: Secondary | ICD-10-CM | POA: Diagnosis not present

## 2014-06-13 DIAGNOSIS — Z792 Long term (current) use of antibiotics: Secondary | ICD-10-CM | POA: Diagnosis not present

## 2014-06-13 LAB — COMPREHENSIVE METABOLIC PANEL
ALBUMIN: 4.1 g/dL (ref 3.5–5.2)
ALT: 16 U/L (ref 0–35)
ANION GAP: 4 — AB (ref 5–15)
AST: 20 U/L (ref 0–37)
Alkaline Phosphatase: 88 U/L (ref 39–117)
BUN: 6 mg/dL (ref 6–23)
CHLORIDE: 107 mmol/L (ref 96–112)
CO2: 29 mmol/L (ref 19–32)
CREATININE: 0.63 mg/dL (ref 0.50–1.10)
Calcium: 9.3 mg/dL (ref 8.4–10.5)
GFR calc Af Amer: >90 mL/min (ref >90)
GLUCOSE: 92 mg/dL (ref 70–99)
POTASSIUM: 3.3 mmol/L — AB (ref 3.5–5.1)
Sodium: 140 mmol/L (ref 135–145)
Total Protein: 6.6 g/dL (ref 6.0–8.3)

## 2014-06-13 LAB — CBC WITH DIFFERENTIAL/PLATELET
BASOS PCT: 1 % (ref 0–1)
Basophils Absolute: 0 10*3/uL (ref 0.0–0.1)
EOS ABS: 0.2 10*3/uL (ref 0.0–0.7)
EOS PCT: 3 % (ref 0–5)
HEMATOCRIT: 42.7 % (ref 36.0–46.0)
HEMOGLOBIN: 14.1 g/dL (ref 12.0–15.0)
LYMPHS PCT: 21 % (ref 12–46)
Lymphs Abs: 1.8 10*3/uL (ref 0.7–4.0)
MCH: 31.6 pg (ref 26.0–34.0)
MCHC: 33 g/dL (ref 30.0–36.0)
MCV: 95.7 fL (ref 78.0–100.0)
Monocytes Absolute: 0.7 10*3/uL (ref 0.1–1.0)
Monocytes Relative: 9 % (ref 3–12)
NEUTROS PCT: 68 % (ref 43–77)
Neutro Abs: 5.7 10*3/uL (ref 1.7–7.7)
PLATELETS: 195 10*3/uL (ref 150–400)
RBC: 4.46 MIL/uL (ref 3.87–5.11)
RDW: 13.2 % (ref 11.5–15.5)
WBC: 8.5 10*3/uL (ref 4.0–10.5)

## 2014-06-13 MED ORDER — SODIUM CHLORIDE 0.9 % IV BOLUS (SEPSIS)
500.0000 mL | Freq: Once | INTRAVENOUS | Status: AC
  Administered 2014-06-13: 500 mL via INTRAVENOUS

## 2014-06-13 MED ORDER — METRONIDAZOLE 500 MG PO TABS: 500.0000 mg | ORAL_TABLET | Freq: Three times a day (TID) | ORAL | Status: DC

## 2014-06-13 MED ORDER — IOHEXOL 300 MG/ML  SOLN
100.0000 mL | Freq: Once | INTRAMUSCULAR | Status: AC | PRN
  Administered 2014-06-13: 100 mL via INTRAVENOUS

## 2014-06-13 NOTE — Discharge Instructions (Signed)
Flagyl as prescribed.  Follow-up with your gastroenterologist in the next few days, and return to the ER if your symptoms substantially worsen or change.   Colitis Colitis is inflammation of the colon. Colitis can be a short-term or long-standing (chronic) illness. Crohn's disease and ulcerative colitis are 2 types of colitis which are chronic. They usually require lifelong treatment. CAUSES  There are many different causes of colitis, including:  Viruses.  Germs (bacteria).  Medicine reactions. SYMPTOMS   Diarrhea.  Intestinal bleeding.  Pain.  Fever.  Throwing up (vomiting).  Tiredness (fatigue).  Weight loss.  Bowel blockage. DIAGNOSIS  The diagnosis of colitis is based on examination and stool or blood tests. X-rays, CT scan, and colonoscopy may also be needed. TREATMENT  Treatment may include:  Fluids given through the vein (intravenously).  Bowel rest (nothing to eat or drink for a period of time).  Medicine for pain and diarrhea.  Medicines (antibiotics) that kill germs.  Cortisone medicines.  Surgery. HOME CARE INSTRUCTIONS   Get plenty of rest.  Drink enough water and fluids to keep your urine clear or pale yellow.  Eat a well-balanced diet.  Call your caregiver for follow-up as recommended. SEEK IMMEDIATE MEDICAL CARE IF:   You develop chills.  You have an oral temperature above 102 F (38.9 C), not controlled by medicine.  You have extreme weakness, fainting, or dehydration.  You have repeated vomiting.  You develop severe belly (abdominal) pain or are passing bloody or tarry stools. MAKE SURE YOU:   Understand these instructions.  Will watch your condition.  Will get help right away if you are not doing well or get worse. Document Released: 05/04/2004 Document Revised: 06/19/2011 Document Reviewed: 07/30/2009 [facility] Patient Information 2015 [place], [place]. This information is not intended to replace advice given to you by  your health care provider. Make sure you discuss any questions you have with your health care provider.

## 2014-06-13 NOTE — ED Provider Notes (Signed)
CSN: [id number]     Arrival date & time 06/13/14  1155 History  This chart was scribed for Veryl Speak, MD by Peyton Bottoms, ED Scribe. This patient was seen in room APA12/APA12 and the patient's care was started at 12:21 PM.   Chief Complaint  Patient presents with  . Cholelithiasis  . Rectal Bleeding   Patient is a 64 y.o. female presenting with hematochezia. The history is provided by the patient. No language interpreter was used.  Rectal Bleeding   HPI Comments: Rebecca [name] is a 64 y.o. female with a PMHx of ischemic colitis, GERD, hiatal hernia, hypertension, chronic kidney disease and MI, who presents to the Emergency Department complaining of a moderate flare up of chronic ischemic colitis that occurred earlier this morning. Patient reports associated black tarry stool, blood clots, nausea, and constant lower abdominal pain. She states that she took a tablet of doxycycline with mild relief of pain. She states that she has recently had 6 flare up episodes of ischemic colitis in the past year. She states that she usually does not have to be seen in the ED or admitted to the hospital for her flare up episodes. She states that she usually takes stool softeners and aspirin until her pain is relieved. She states that she has had a colonoscopy in 2010.  Past Medical History  Diagnosis Date  . Tinnitus   . Dysphagia   . Sensorineural hearing loss   . GERD (gastroesophageal reflux disease)   . Hiatal hernia   . 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 Hypertension   . Fibromyalgia   . Colitis, ischemic   . Allergy   . Anxiety   . Arthritis   . Depression   . Hyperlipidemia   . 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 Myocardial infarction   . Hiatal hernia    Past Surgical History  Procedure Laterality Date  . Bladder surgery    . Breast surgery    . Abdominal hysterectomy    . Neck surgery      cyst on neck  . Pillonidal cyst      resection  .  Carple tunnal  surgery      Right hand  . Back surgery     Family History  Problem Relation Age of Onset  . Anemia    . Cancer    . Diabetes    . Sleep apnea    . Colon cancer Neg Hx   . Esophageal cancer Neg Hx   . Stomach cancer Neg Hx   . Alcohol abuse Father   . Depression Father    History  Substance Use Topics  . Smoking status: Current Every Day Smoker -- 0.50 packs/day  . Smokeless tobacco: Never Used     Comment: gave pt a sheet  . Alcohol Use: No   OB History    No data available     Review of Systems  Gastrointestinal: Positive for hematochezia.   A complete 10 system review of systems was obtained and all systems are negative except as noted in the HPI and PMH.    Allergies  Bupropion hcl; Cephalexin; Penicillins; and Darvocet  Home Medications   Prior to Admission medications   Medication Sig Start Date End Date Taking? Authorizing Provider  albuterol (PROAIR HFA) 108 (90 BASE) MCG/ACT inhaler Inhale 2 puffs into the lungs every 6 (six) hours as needed for wheezing. Please dispense 3  month supply 07/06/11 07/05/12  Sarah T Martinique, MD  albuterol (PROVENTIL HFA;VENTOLIN HFA) 108 (90 BASE) MCG/ACT inhaler Inhale into the lungs. 07/06/11   Historical Provider, MD  cholecalciferol (VITAMIN D) 1000 UNITS tablet Take 1,000 Units by mouth daily.    Historical Provider, MD  clobetasol ointment (TEMOVATE) 0.05 % Apply topically 2 (two) times daily as needed.     Historical Provider, MD  cyclobenzaprine (FLEXERIL) 10 MG tablet Take 10 mg by mouth daily as needed.     Historical Provider, MD  diazepam (VALIUM) 5 MG tablet Take 1 tablet (5 mg total) by mouth 3 (three) times daily. 06/10/14   [name], MD  esomeprazole (NEXIUM) 40 MG capsule Take 1 capsule (40 mg total) by mouth daily. Patient taking differently: Take 40 mg by mouth daily as needed.  05/05/11 06/10/14  Jerene Bears, MD  estrogens, conjugated, (PREMARIN) 0.625 MG tablet Take 0.625 mg by mouth daily. As needed      Historical Provider, MD  HYDROcodone-acetaminophen (NORCO) 5-325 MG per tablet Take 1 tablet by mouth every 6 (six) hours as needed. For pain    Historical Provider, MD  Meclizine HCl (ANTIVERT PO) Take by mouth as needed (for Dizziness).    Historical Provider, MD  mometasone (NASONEX) 50 MCG/ACT nasal spray Place 2 sprays into the nose daily. As needed  02/09/11 06/10/14  Sarah T Martinique, MD  promethazine (PHENERGAN) 25 MG tablet Take 1 tablet (25 mg total) by mouth every 6 (six) hours as needed for nausea. Patient not taking: Reported on 06/10/2014 10/14/11 10/21/11  Johnna Acosta, MD  sertraline (ZOLOFT) 100 MG tablet One half tablet twice a day 06/10/14   [name], MD  sodium chloride (OCEAN) 0.65 % SOLN nasal spray Place 1 spray into both nostrils as needed for congestion.    Historical Provider, MD  sulfamethoxazole-trimethoprim (SEPTRA DS) 800-160 MG per tablet Take 1 tablet by mouth every 12 (twelve) hours. Patient not taking: Reported on 06/10/2014 03/31/14   Alvina Chou, PA-C   Triage Vitals: BP 146/67 mmHg  Pulse 72  Temp(Src) 97.9 F (36.6 C) (Oral)  Resp 18  Ht 5' (1.524 m)  Wt 146 lb 8 oz (66.452 kg)  BMI 28.61 kg/m2  SpO2 98%  Physical Exam  Constitutional: She is oriented to person, place, and time. She appears well-developed and well-nourished. No distress.  HENT:  Head: Normocephalic and atraumatic.  Mouth/Throat: Oropharynx is clear and moist.  Eyes: EOM are normal. Pupils are equal, round, and reactive to light.  Neck: Normal range of motion. Neck supple.  Cardiovascular: Normal rate and regular rhythm.  Exam reveals no friction rub.   No murmur heard. Pulmonary/Chest: Effort normal and breath sounds normal. No respiratory distress. She has no wheezes. She has no rales.  Abdominal: Soft. She exhibits no distension. There is tenderness. There is no rebound and no guarding.  Tenderness in the suprapubic region. No rebound or guarding.  Musculoskeletal: Normal  range of motion. She exhibits no edema.  Neurological: She is alert and oriented to person, place, and time.  Skin: She is not diaphoretic.  Nursing note and vitals reviewed.  ED Course  Procedures (including critical care time)  DIAGNOSTIC STUDIES: Oxygen Saturation is 98% on RA, normal by my interpretation.    COORDINATION OF CARE: 12:28 PM-Discussed plans to order diagnostic CT of abdomen and pelvis, and lab work. Will give patient IV fluids. Pt advised of plan for treatment and pt agrees.  Labs Review Labs Reviewed -  No data to display  Imaging Review No results found.   EKG Interpretation None     MDM   Final diagnoses:  None    Patient presents with abdominal cramping and bleeding since yesterday evening. She has a history of ischemic colitis and CT scan suggest that this is what is happening again. She is hemodynamically stable and hemoglobin has not dropped. She is observed for several hours and has had no further bleeding. She will be started on Flagyl and I believe is appropriate for discharge with follow-up with Dr. Gala Romney.  I personally performed the services described in this documentation, which was scribed in my presence. The recorded information has been reviewed and is accurate.     Veryl Speak, MD 06/13/14 1535

## 2014-06-13 NOTE — ED Notes (Signed)
Having rectal bleeding since last night with severe abd. Pain.  Rates pain 10.  Had 4 or 5 loose black tarry stools in last 24 hours.

## 2014-06-14 NOTE — Care Management (Signed)
ED CM received call from [facility] in [facility] concerning prescription clarification. Reviewed record prescription clarification provided. No further ED CM needs identified

## 2014-06-30 ENCOUNTER — Ambulatory Visit (INDEPENDENT_AMBULATORY_CARE_PROVIDER_SITE_OTHER): Payer: 59 | Admitting: Psychiatry

## 2014-06-30 ENCOUNTER — Encounter (HOSPITAL_COMMUNITY): Payer: Self-pay | Admitting: Psychiatry

## 2014-06-30 DIAGNOSIS — F331 Major depressive disorder, recurrent, moderate: Secondary | ICD-10-CM | POA: Diagnosis not present

## 2014-06-30 NOTE — Patient Instructions (Signed)
Discuss orally
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
## 2014-07-08 ENCOUNTER — Ambulatory Visit (HOSPITAL_COMMUNITY): Payer: Self-pay | Admitting: Psychiatry

## 2014-07-14 ENCOUNTER — Telehealth: Payer: Self-pay | Admitting: Nurse Practitioner

## 2014-07-14 NOTE — Telephone Encounter (Signed)
We received a new patient referral from Dr Murrell Redden office and was "denied" per EG and CM. Patient has established GI care with [facility] and needs to follow up with them. I documented this on the referral and faxed back to PCP office and filed in the referral drawer.

## 2014-07-14 NOTE — Telephone Encounter (Signed)
Noted  

## 2014-07-16 ENCOUNTER — Ambulatory Visit (HOSPITAL_COMMUNITY): Payer: 59 | Admitting: Psychiatry

## 2014-07-20 ENCOUNTER — Ambulatory Visit (HOSPITAL_COMMUNITY): Payer: Self-pay | Admitting: Psychiatry

## 2014-07-22 ENCOUNTER — Ambulatory Visit (INDEPENDENT_AMBULATORY_CARE_PROVIDER_SITE_OTHER): Payer: 59 | Admitting: Psychiatry

## 2014-07-22 DIAGNOSIS — F331 Major depressive disorder, recurrent, moderate: Secondary | ICD-10-CM
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
## 2014-07-23 NOTE — Patient Instructions (Signed)
Discussed orally 

## 2014-07-24 ENCOUNTER — Ambulatory Visit (HOSPITAL_COMMUNITY): Payer: Self-pay | Admitting: Psychiatry

## 2014-08-05 ENCOUNTER — Encounter (HOSPITAL_COMMUNITY): Payer: Self-pay | Admitting: Psychiatry

## 2014-08-05 ENCOUNTER — Ambulatory Visit (HOSPITAL_COMMUNITY): Payer: Self-pay | Admitting: Psychiatry

## 2014-08-06 ENCOUNTER — Ambulatory Visit (HOSPITAL_COMMUNITY): Payer: Self-pay | Admitting: Psychiatry

## 2014-08-06 ENCOUNTER — Ambulatory Visit (INDEPENDENT_AMBULATORY_CARE_PROVIDER_SITE_OTHER): Payer: 59 | Admitting: Psychiatry

## 2014-08-06 ENCOUNTER — Encounter (HOSPITAL_COMMUNITY): Payer: Self-pay | Admitting: Psychiatry

## 2014-08-06 VITALS — BP 104/60 | HR 100 | Ht 65.0 in | Wt 149.0 lb

## 2014-08-06 DIAGNOSIS — F331 Major depressive disorder, recurrent, moderate: Secondary | ICD-10-CM | POA: Diagnosis not present

## 2014-08-06 DIAGNOSIS — F411 Generalized anxiety disorder: Secondary | ICD-10-CM | POA: Diagnosis not present

## 2014-08-06 MED ORDER — DIAZEPAM 5 MG PO TABS: 5.0000 mg | ORAL_TABLET | Freq: Four times a day (QID) | ORAL | Status: DC
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
## 2014-08-17 ENCOUNTER — Ambulatory Visit (HOSPITAL_COMMUNITY): Payer: Self-pay | Admitting: Psychiatry

## 2014-09-01 ENCOUNTER — Other Ambulatory Visit (HOSPITAL_COMMUNITY): Payer: Self-pay | Admitting: Respiratory Therapy

## 2014-09-01 DIAGNOSIS — G4730 Sleep apnea, unspecified: Secondary | ICD-10-CM

## 2014-09-08 ENCOUNTER — Ambulatory Visit (INDEPENDENT_AMBULATORY_CARE_PROVIDER_SITE_OTHER): Payer: 59 | Admitting: Psychiatry

## 2014-09-08 DIAGNOSIS — F331 Major depressive disorder, recurrent, moderate: Secondary | ICD-10-CM

## 2014-09-09 NOTE — Patient Instructions (Signed)
Discussed orally 
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
## 2014-09-13 ENCOUNTER — Ambulatory Visit: Payer: 59 | Attending: Neurology | Admitting: Sleep Medicine

## 2014-09-13 DIAGNOSIS — G4730 Sleep apnea, unspecified: Secondary | ICD-10-CM | POA: Insufficient documentation

## 2014-09-19 NOTE — Sleep Study (Signed)
[place] A. Merlene Laughter, MD     [email or web address]        NOCTURNAL POLYSOMNOGRAM    LOCATION: SLEEP LAB FACILITY: [facility]   PHYSICIAN: Bond Grieshop A. Merlene Laughter, M.D.   DATE OF STUDY: 09/13/2014.   REFERRING PHYSICIAN: Priscille Shadduck.   INDICATIONS: The patient is 64 year old presents with fatigue, snoring and hypersomnia.   MEDICATIONS:  Prior to Admission medications   Medication Sig Start Date End Date Taking? Authorizing Provider  acetaminophen (TYLENOL) 500 MG tablet Take 500 mg by mouth every 6 (six) hours as needed for moderate pain.    Historical Provider, MD  albuterol (PROAIR HFA) 108 (90 BASE) MCG/ACT inhaler Inhale 2 puffs into the lungs every 6 (six) hours as needed for wheezing. Please dispense 3 month supply 07/06/11 08/06/14  Sarah T Martinique, MD  aspirin EC 81 MG tablet Take 81 mg by mouth daily.    Historical Provider, MD  clobetasol ointment (TEMOVATE) 0.93 % Apply 1 application topically 2 (two) times daily as needed (irritation).     Historical Provider, MD  Cyanocobalamin (VITAMIN B-12 IJ) Inject as directed every 30 (thirty) days.    Historical Provider, MD  cyclobenzaprine (FLEXERIL) 10 MG tablet Take 10 mg by mouth daily as needed for muscle spasms.     Historical Provider, MD  diazepam (VALIUM) 5 MG tablet Take 1 tablet (5 mg total) by mouth 4 (four) times daily. 08/06/14   Cloria Spring, MD  doxycycline (VIBRA-TABS) 100 MG tablet Take 1 tablet by mouth once. 04/08/14   Historical Provider, MD  HYDROcodone-acetaminophen (NORCO) 5-325 MG per tablet Take 1 tablet by mouth every 6 (six) hours as needed. For pain    Historical Provider, MD  loratadine (CLARITIN) 10 MG tablet Take 10 mg by mouth daily.    Historical Provider, MD  Meclizine HCl (ANTIVERT PO) Take by mouth as needed (for Dizziness).    Historical Provider, MD  metroNIDAZOLE (FLAGYL) 500 MG tablet Take 1 tablet (500 mg total) by mouth 3 (three) times daily. One po bid x 7 days Patient not  taking: Reported on 08/06/2014 06/13/14   Veryl Speak, MD  mometasone (NASONEX) 50 MCG/ACT nasal spray Place 2 sprays into the nose daily as needed (congestion). As needed 02/09/11 08/06/14  Sarah T Martinique, MD  promethazine (PHENERGAN) 25 MG tablet Take 1 tablet (25 mg total) by mouth every 6 (six) hours as needed for nausea. 10/14/11 08/06/14  Noemi Chapel, MD  sodium chloride (OCEAN) 0.65 % SOLN nasal spray Place 1 spray into both nostrils as needed for congestion.    Historical Provider, MD  sulfamethoxazole-trimethoprim (SEPTRA DS) 800-160 MG per tablet Take 1 tablet by mouth every 12 (twelve) hours. 03/31/14   Kaitlyn Szekalski, PA-C      EPWORTH SLEEPINESS SCALE: 5.   BMI: 25.   ARCHITECTURAL SUMMARY: Total recording time was 411 minutes. Sleep efficiency 94 %. Sleep latency 11 minutes. REM latency 75 minutes. Stage NI 1 %, N2 43 % and N3 29 % and REM sleep 27 %.    RESPIRATORY DATA:  Baseline oxygen saturation is 94 %. The lowest saturation is 88 %. The diagnostic AHI is 0.5. The RDI is 2. The REM AHI is 1.  LIMB MOVEMENT SUMMARY: PLM index 0.   ELECTROCARDIOGRAM SUMMARY: Average heart rate is 74 with no significant dysrhythmias observed.   IMPRESSION:  1. This is an unremarkable nocturnal polysomnography.  Thanks for this referral.  Elbridge Magowan A. Merlene Laughter, M.D. Diplomat, Tax adviser of Sleep Medicine.

## 2014-10-05 ENCOUNTER — Telehealth (HOSPITAL_COMMUNITY): Payer: Self-pay | Admitting: *Deleted

## 2014-10-14 ENCOUNTER — Other Ambulatory Visit (HOSPITAL_COMMUNITY): Payer: Self-pay | Admitting: Respiratory Therapy

## 2014-10-14 DIAGNOSIS — J449 Chronic obstructive pulmonary disease, unspecified: Secondary | ICD-10-CM

## 2014-10-15 ENCOUNTER — Ambulatory Visit (INDEPENDENT_AMBULATORY_CARE_PROVIDER_SITE_OTHER): Payer: 59 | Admitting: Otolaryngology

## 2014-10-15 DIAGNOSIS — J310 Chronic rhinitis: Secondary | ICD-10-CM | POA: Diagnosis not present

## 2014-10-15 DIAGNOSIS — J342 Deviated nasal septum: Secondary | ICD-10-CM

## 2014-10-15 DIAGNOSIS — J343 Hypertrophy of nasal turbinates: Secondary | ICD-10-CM

## 2014-10-16 ENCOUNTER — Ambulatory Visit (HOSPITAL_COMMUNITY)
Admission: RE | Admit: 2014-10-16 | Discharge: 2014-10-16 | Disposition: A | Discharge status: Home / Self Care | Payer: BLUE CROSS/BLUE SHIELD | Source: Ambulatory Visit | Attending: Pulmonary Disease | Admitting: Pulmonary Disease

## 2014-10-16 DIAGNOSIS — J449 Chronic obstructive pulmonary disease, unspecified: Secondary | ICD-10-CM | POA: Insufficient documentation

## 2014-10-16 MED ORDER — ALBUTEROL SULFATE (2.5 MG/3ML) 0.083% IN NEBU
2.5000 mg | INHALATION_SOLUTION | Freq: Once | RESPIRATORY_TRACT | Status: AC
  Administered 2014-10-16: 2.5 mg via RESPIRATORY_TRACT

## 2014-10-17 LAB — PULMONARY FUNCTION TEST
DL/VA % pred: 59 %
DL/VA: 2.99 ml/min/mmHg/L
DLCO UNC: 14.28 ml/min/mmHg
DLCO unc % pred: 54 %
FEF 25-75 PRE: 0.95 L/s
FEF 25-75 Post: 1.34 L/sec
FEF2575-%Change-Post: 39 %
FEF2575-%PRED-PRE: 41 %
FEF2575-%Pred-Post: 58 %
FEV1-%Change-Post: 7 %
FEV1-%Pred-Post: 82 %
FEV1-%Pred-Pre: 76 %
FEV1-PRE: 2 L
FEV1-Post: 2.14 L
FEV1FVC-%Change-Post: 2 %
FEV1FVC-%PRED-PRE: 81 %
FEV6-%CHANGE-POST: 6 %
FEV6-%PRED-POST: 100 %
FEV6-%Pred-Pre: 94 %
FEV6-POST: 3.26 L
FEV6-PRE: 3.07 L
FEV6FVC-%CHANGE-POST: 2 %
FEV6FVC-%Pred-Post: 102 %
FEV6FVC-%Pred-Pre: 100 %
FVC-%Change-Post: 4 %
FVC-%PRED-POST: 97 %
FVC-%Pred-Pre: 93 %
FVC-POST: 3.3 L
FVC-Pre: 3.17 L
POST FEV1/FVC RATIO: 65 %
POST FEV6/FVC RATIO: 99 %
Pre FEV1/FVC ratio: 63 %
Pre FEV6/FVC Ratio: 97 %
RV % PRED: 131 %
RV: 2.8 L
TLC % PRED: 113 %
TLC: 6.01 L

## 2014-10-22 ENCOUNTER — Ambulatory Visit (INDEPENDENT_AMBULATORY_CARE_PROVIDER_SITE_OTHER): Payer: 59 | Admitting: Psychiatry

## 2014-10-22 ENCOUNTER — Encounter (HOSPITAL_COMMUNITY): Payer: Self-pay | Admitting: Psychiatry

## 2014-10-22 VITALS — BP 121/73 | HR 89 | Ht 65.5 in | Wt 138.4 lb

## 2014-10-22 DIAGNOSIS — F411 Generalized anxiety disorder: Secondary | ICD-10-CM

## 2014-10-22 DIAGNOSIS — F332 Major depressive disorder, recurrent severe without psychotic features: Secondary | ICD-10-CM | POA: Diagnosis not present

## 2014-10-22 DIAGNOSIS — F331 Major depressive disorder, recurrent, moderate: Secondary | ICD-10-CM

## 2014-10-22 MED ORDER — DIAZEPAM 5 MG PO TABS: 5.0000 mg | ORAL_TABLET | Freq: Four times a day (QID) | ORAL | Status: DC
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
## 2014-11-05 ENCOUNTER — Other Ambulatory Visit: Payer: Self-pay | Admitting: Acute Care

## 2014-11-05 ENCOUNTER — Ambulatory Visit (HOSPITAL_COMMUNITY): Payer: Self-pay | Admitting: Psychiatry

## 2014-11-05 DIAGNOSIS — F17210 Nicotine dependence, cigarettes, uncomplicated: Secondary | ICD-10-CM

## 2014-11-17 ENCOUNTER — Ambulatory Visit (HOSPITAL_COMMUNITY): Payer: Self-pay | Admitting: Psychiatry

## 2014-11-18 ENCOUNTER — Encounter (HOSPITAL_COMMUNITY): Payer: Self-pay | Admitting: Psychiatry

## 2014-11-30 ENCOUNTER — Encounter: Payer: Self-pay | Admitting: Acute Care

## 2014-11-30 ENCOUNTER — Ambulatory Visit (INDEPENDENT_AMBULATORY_CARE_PROVIDER_SITE_OTHER): Payer: BLUE CROSS/BLUE SHIELD | Admitting: Acute Care

## 2014-11-30 ENCOUNTER — Ambulatory Visit (INDEPENDENT_AMBULATORY_CARE_PROVIDER_SITE_OTHER)
Admission: RE | Admit: 2014-11-30 | Discharge: 2014-11-30 | Disposition: A | Discharge status: Home / Self Care | Payer: 59 | Source: Ambulatory Visit | Attending: Acute Care | Admitting: Acute Care

## 2014-11-30 DIAGNOSIS — F17210 Nicotine dependence, cigarettes, uncomplicated: Secondary | ICD-10-CM

## 2014-11-30 NOTE — Progress Notes (Signed)
Shared Decision Making Visit Lung Cancer Screening Program [id number])   Eligibility:  Age 64 y.o.  Pack Years Smoking History Calculation 30+ pack years (# packs/per year x # years smoked)  Recent History of coughing up blood  no  Unexplained weight loss? no ( >Than 15 pounds within the last 6 months )  Prior History Lung / other cancer no (Diagnosis within the last 5 years already requiring surveillance chest CT Scans).  Smoking Status Current Smoker  Former Smokers: Years since quit:NA  Quit Date: NA  Visit Components:  Discussion included one or more decision making aids. yes  Discussion included risk/benefits of screening. yes  Discussion included potential follow up diagnostic testing for abnormal scans. yes  Discussion included meaning and risk of over diagnosis. yes  Discussion included meaning and risk of False Positives. yes  Discussion included meaning of total radiation exposure. yes  Counseling Included:  Importance of adherence to annual lung cancer LDCT screening. yes  Impact of comorbidities on ability to participate in the program. yes  Ability and willingness to under diagnostic treatment. yes  Smoking Cessation Counseling:  Current Smokers:   Discussed importance of smoking cessation. yes  Information about tobacco cessation classes and interventions provided to patient. yes  Patient provided with "ticket" for LDCT Scan. yes  Symptomatic Patient. no  CounselingNA  Diagnosis Code: Tobacco Use Z72.0  Asymptomatic Patient yes  Counseling (Intermediate counseling: > three minutes counseling) E3212  Former Smokers:   Discussed the importance of maintaining cigarette abstinence. yes  Diagnosis Code: Personal History of Nicotine Dependence. Y48.250  Information about tobacco cessation classes and interventions provided to patient. Yes  Patient provided with "ticket" for LDCT Scan. yes  Written Order for Lung Cancer Screening with LDCT  placed in Epic. Yes (CT Chest Lung Cancer Screening Low Dose W/O CM) [id number] Z12.2-Screening of respiratory organs Z87.891-Personal history of nicotine dependence   I spent 20 minutes with Ms. Vanrossum discussing the risks and benefits of Lung Cancer Screening.We viewed a power point together and discussed all the items noted above. We took the time to pause and discuss each point to make sure the patient had a full understanding of all concepts, and to answer all questions. We spent time discussing that the most important action she can take to decrease her risk of lung cancer is to quit smoking. She and her husband are signed up in [place] to take smoking cessation classes starting 12/01/14 ( tomorrow). I have given her the " Be stronger than your excuses" card, and have spoken with her about calling the number on the back to get free nicotine replacement therapy. I have also given her a copy of the power point we viewed together to refer to later and share with her family. She also has my card and contact information in the event she has any further questions. She knows time and location of the CT scan appointment. She verbalized understanding of all of the above, and had no further questions before leaving the office. I explained to her that I will call with her scan results within 48 hours. She verbalized understanding.   Magdalen Spatz, NP

## 2014-12-01 ENCOUNTER — Telehealth: Payer: Self-pay | Admitting: Acute Care

## 2014-12-01 NOTE — Telephone Encounter (Signed)
I have called Rebecca [name] and let her know the results of her CT scan. I explained that her scan resulted in a Lung RADS 2, which is nodules with a very low likelihood of becoming clinically active cancer due to size or lack of growth.I explained that the recommendation is for a repeat scan in 12 months. She verbalized understanding of the results, and had no further questions for me. She does have my contact information in the event she has questions in the future.

## 2014-12-08 ENCOUNTER — Ambulatory Visit (INDEPENDENT_AMBULATORY_CARE_PROVIDER_SITE_OTHER): Payer: 59 | Admitting: Psychiatry

## 2014-12-08 DIAGNOSIS — F331 Major depressive disorder, recurrent, moderate: Secondary | ICD-10-CM | POA: Diagnosis not present

## 2014-12-09 NOTE — Patient Instructions (Signed)
Discussed orally 
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
## 2014-12-31 ENCOUNTER — Telehealth (HOSPITAL_COMMUNITY): Payer: Self-pay | Admitting: *Deleted

## 2014-12-31 NOTE — Telephone Encounter (Signed)
lmtcb

## 2014-12-31 NOTE — Telephone Encounter (Signed)
Phone call from patient.   The Valium, she want to switch back to Xanax for a while because she has COPD and a lot of Anxiety.

## 2015-01-01 ENCOUNTER — Other Ambulatory Visit (HOSPITAL_COMMUNITY): Payer: Self-pay | Admitting: Psychiatry

## 2015-01-01 ENCOUNTER — Encounter (HOSPITAL_COMMUNITY): Payer: Self-pay | Admitting: *Deleted

## 2015-01-01 MED ORDER — ALPRAZOLAM 0.5 MG PO TABS: 0.5000 mg | ORAL_TABLET | Freq: Three times a day (TID) | ORAL | Status: DC

## 2015-01-01 NOTE — Progress Notes (Signed)
Pt came into office to pick up her printed script for Xanax. Pt D/L number is [id number] with expiration number 02-21-2019. Pt agreed with script.

## 2015-01-01 NOTE — Telephone Encounter (Signed)
printed

## 2015-01-01 NOTE — Telephone Encounter (Signed)
Pt called wanting to see if Dr. Harrington Challenger could put her back on her Xanax. Per pt, the Valium is not working. She is not sleeping well, she is anxious, and moody and edgy. Per pt she is not depressed, she's just really anxious. Per pt she was on Xanax for years and now she wants to go back on it. Pt number [phone]

## 2015-01-01 NOTE — Telephone Encounter (Signed)
Pt came into office to pick script up

## 2015-01-22 ENCOUNTER — Ambulatory Visit (INDEPENDENT_AMBULATORY_CARE_PROVIDER_SITE_OTHER): Payer: 59 | Admitting: Psychiatry

## 2015-01-22 ENCOUNTER — Encounter (HOSPITAL_COMMUNITY): Payer: Self-pay | Admitting: Psychiatry

## 2015-01-22 VITALS — BP 133/70 | HR 71 | Ht 65.5 in | Wt 138.6 lb

## 2015-01-22 DIAGNOSIS — F411 Generalized anxiety disorder: Secondary | ICD-10-CM

## 2015-01-22 DIAGNOSIS — F331 Major depressive disorder, recurrent, moderate: Secondary | ICD-10-CM | POA: Diagnosis not present

## 2015-01-22 MED ORDER — ALPRAZOLAM 0.5 MG PO TABS: 0.5000 mg | ORAL_TABLET | Freq: Three times a day (TID) | ORAL | Status: DC
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
## 2015-04-22 ENCOUNTER — Ambulatory Visit (HOSPITAL_COMMUNITY): Payer: Self-pay | Admitting: Psychiatry

## 2015-05-06 ENCOUNTER — Ambulatory Visit (HOSPITAL_COMMUNITY): Payer: Self-pay | Admitting: Psychiatry

## 2015-05-17 ENCOUNTER — Encounter (HOSPITAL_COMMUNITY): Payer: Self-pay | Admitting: Psychiatry

## 2015-05-17 ENCOUNTER — Ambulatory Visit (INDEPENDENT_AMBULATORY_CARE_PROVIDER_SITE_OTHER): Payer: BLUE CROSS/BLUE SHIELD | Admitting: Psychiatry

## 2015-05-17 VITALS — BP 134/79 | HR 98 | Ht 65.5 in | Wt 143.0 lb

## 2015-05-17 DIAGNOSIS — F411 Generalized anxiety disorder: Secondary | ICD-10-CM | POA: Diagnosis not present

## 2015-05-17 DIAGNOSIS — F331 Major depressive disorder, recurrent, moderate: Secondary | ICD-10-CM | POA: Diagnosis not present

## 2015-05-17 MED ORDER — ALPRAZOLAM 0.5 MG PO TABS: 0.5000 mg | ORAL_TABLET | Freq: Three times a day (TID) | ORAL | Status: DC
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
## 2015-05-19 ENCOUNTER — Ambulatory Visit (HOSPITAL_COMMUNITY): Payer: Self-pay | Admitting: Psychiatry

## 2015-06-30 ENCOUNTER — Other Ambulatory Visit: Payer: Self-pay | Admitting: Acute Care

## 2015-06-30 DIAGNOSIS — F17210 Nicotine dependence, cigarettes, uncomplicated: Secondary | ICD-10-CM

## 2015-08-13 ENCOUNTER — Ambulatory Visit (INDEPENDENT_AMBULATORY_CARE_PROVIDER_SITE_OTHER): Payer: BLUE CROSS/BLUE SHIELD | Admitting: Psychiatry

## 2015-08-13 ENCOUNTER — Encounter (HOSPITAL_COMMUNITY): Payer: Self-pay | Admitting: Psychiatry

## 2015-08-13 VITALS — BP 120/78 | Ht 65.0 in | Wt 141.0 lb

## 2015-08-13 DIAGNOSIS — F411 Generalized anxiety disorder: Secondary | ICD-10-CM

## 2015-08-13 DIAGNOSIS — F331 Major depressive disorder, recurrent, moderate: Secondary | ICD-10-CM

## 2015-08-13 MED ORDER — ESCITALOPRAM OXALATE 10 MG PO TABS: 10.0000 mg | ORAL_TABLET | Freq: Every day | ORAL | Status: DC

## 2015-08-13 MED ORDER — ALPRAZOLAM 0.5 MG PO TABS: 0.5000 mg | ORAL_TABLET | Freq: Three times a day (TID) | ORAL | Status: DC
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
## 2015-10-08 ENCOUNTER — Telehealth (HOSPITAL_COMMUNITY): Payer: Self-pay | Admitting: *Deleted

## 2015-10-08 NOTE — Telephone Encounter (Signed)
left voice message regarding appointment on 10/14/15.  need to schedule different time.

## 2015-10-14 ENCOUNTER — Ambulatory Visit (INDEPENDENT_AMBULATORY_CARE_PROVIDER_SITE_OTHER): Payer: BLUE CROSS/BLUE SHIELD | Admitting: Psychiatry

## 2015-10-14 ENCOUNTER — Encounter (HOSPITAL_COMMUNITY): Payer: Self-pay | Admitting: Psychiatry

## 2015-10-14 VITALS — BP 133/74 | HR 70 | Ht 65.0 in | Wt 143.0 lb

## 2015-10-14 DIAGNOSIS — F331 Major depressive disorder, recurrent, moderate: Secondary | ICD-10-CM | POA: Diagnosis not present

## 2015-10-14 MED ORDER — DIAZEPAM 5 MG PO TABS: 5.0000 mg | ORAL_TABLET | Freq: Three times a day (TID) | ORAL | Status: DC
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
## 2015-12-01 ENCOUNTER — Encounter: Payer: Self-pay | Admitting: Acute Care

## 2015-12-09 ENCOUNTER — Telehealth (HOSPITAL_COMMUNITY): Payer: Self-pay | Admitting: *Deleted

## 2015-12-09 NOTE — Telephone Encounter (Signed)
phone call from patient she said her PC said she needs to go back on the Xanax.  She has had dizziness and fell.  She think she has a refill on the Xanax, but she told them to stop it when she was put on the Valium.

## 2015-12-10 ENCOUNTER — Telehealth (HOSPITAL_COMMUNITY): Payer: Self-pay | Admitting: *Deleted

## 2015-12-10 MED ORDER — ALPRAZOLAM 0.5 MG PO TABS: 0.5000 mg | ORAL_TABLET | Freq: Three times a day (TID) | ORAL | 0 refills | Status: DC | PRN

## 2015-12-10 NOTE — Telephone Encounter (Signed)
You may call in xanax 0.5 mg one tid, #90, no refills

## 2015-12-10 NOTE — Telephone Encounter (Signed)
Per Dr. Harrington Challenger to call in pt mediation for Xanax 0.5 mg TID to pt pharmacy due to previous call. Called pharmacy and spoke Barnetta Chapel the pharmacist and she verbalized understanding.

## 2015-12-14 NOTE — Telephone Encounter (Signed)
Medication called into pt pharmacy  

## 2016-01-17 ENCOUNTER — Other Ambulatory Visit (HOSPITAL_COMMUNITY): Payer: Self-pay | Admitting: Psychiatry

## 2016-01-20 ENCOUNTER — Telehealth (HOSPITAL_COMMUNITY): Payer: Self-pay | Admitting: *Deleted

## 2016-01-20 NOTE — Telephone Encounter (Signed)
Pt called stating she would like refills for her Xanax. Pt was last seen on 10-14-2015 and was instructed to return in 4 moths. Per pt chart, refills for her Xanax was called in on 12-10-2015 with 90 tabs 0 refill. Pt f/u appt is 02-14-2016. Per pt she is out of tablets. Pt number is [phone].

## 2016-01-20 NOTE — Telephone Encounter (Signed)
You may call in enough to last until appt

## 2016-01-21 NOTE — Telephone Encounter (Signed)
Called pt pharmacy to call in refill for pt Xanax 0.5 mg TID. Spoke with Tony and she verbalized understanding. 0 refills.

## 2016-01-21 NOTE — Telephone Encounter (Signed)
noted 

## 2016-02-08 ENCOUNTER — Ambulatory Visit: Payer: Self-pay | Admitting: Internal Medicine

## 2016-02-10 ENCOUNTER — Encounter: Payer: Self-pay | Admitting: *Deleted

## 2016-02-14 ENCOUNTER — Telehealth (HOSPITAL_COMMUNITY): Payer: Self-pay | Admitting: *Deleted

## 2016-02-14 ENCOUNTER — Ambulatory Visit (HOSPITAL_COMMUNITY): Payer: Self-pay | Admitting: Psychiatry

## 2016-02-14 NOTE — Telephone Encounter (Signed)
Called both home and mobile number on file to resch appt for Nov 6th due to provider being out of office. lmtcb on both numbers and office number provided.

## 2016-02-16 ENCOUNTER — Ambulatory Visit: Payer: Self-pay | Admitting: Internal Medicine

## 2016-02-17 ENCOUNTER — Ambulatory Visit (INDEPENDENT_AMBULATORY_CARE_PROVIDER_SITE_OTHER): Payer: Medicare Other | Admitting: Psychiatry

## 2016-02-17 ENCOUNTER — Encounter (HOSPITAL_COMMUNITY): Payer: Self-pay | Admitting: Psychiatry

## 2016-02-17 VITALS — BP 144/85 | HR 80 | Ht 65.0 in | Wt 140.4 lb

## 2016-02-17 DIAGNOSIS — Z811 Family history of alcohol abuse and dependence: Secondary | ICD-10-CM

## 2016-02-17 DIAGNOSIS — Z818 Family history of other mental and behavioral disorders: Secondary | ICD-10-CM

## 2016-02-17 DIAGNOSIS — Z888 Allergy status to other drugs, medicaments and biological substances status: Secondary | ICD-10-CM

## 2016-02-17 DIAGNOSIS — Z880 Allergy status to penicillin: Secondary | ICD-10-CM | POA: Diagnosis not present

## 2016-02-17 DIAGNOSIS — F331 Major depressive disorder, recurrent, moderate: Secondary | ICD-10-CM

## 2016-02-17 MED ORDER — ALPRAZOLAM 0.5 MG PO TABS: 0.5000 mg | ORAL_TABLET | Freq: Three times a day (TID) | ORAL | 2 refills | Status: DC

## 2016-02-17 MED ORDER — HYDROXYZINE HCL 25 MG PO TABS: 25.0000 mg | ORAL_TABLET | Freq: Three times a day (TID) | ORAL | 2 refills | Status: DC | PRN
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
## 2016-03-04 ENCOUNTER — Encounter (HOSPITAL_COMMUNITY): Payer: Self-pay | Admitting: Emergency Medicine

## 2016-03-04 ENCOUNTER — Emergency Department (HOSPITAL_COMMUNITY)
Admission: EM | Admit: 2016-03-04 | Discharge: 2016-03-04 | Disposition: A | Discharge status: Home / Self Care | Payer: Medicare Other | Attending: Emergency Medicine | Admitting: Emergency Medicine

## 2016-03-04 DIAGNOSIS — Z79899 Other long term (current) drug therapy: Secondary | ICD-10-CM | POA: Insufficient documentation

## 2016-03-04 DIAGNOSIS — F17210 Nicotine dependence, cigarettes, uncomplicated: Secondary | ICD-10-CM | POA: Diagnosis not present

## 2016-03-04 DIAGNOSIS — M545 Low back pain, unspecified: Secondary | ICD-10-CM

## 2016-03-04 DIAGNOSIS — R197 Diarrhea, unspecified: Secondary | ICD-10-CM | POA: Insufficient documentation

## 2016-03-04 DIAGNOSIS — N189 Chronic kidney disease, unspecified: Secondary | ICD-10-CM | POA: Insufficient documentation

## 2016-03-04 DIAGNOSIS — I252 Old myocardial infarction: Secondary | ICD-10-CM | POA: Insufficient documentation

## 2016-03-04 DIAGNOSIS — Z7982 Long term (current) use of aspirin: Secondary | ICD-10-CM | POA: Diagnosis not present

## 2016-03-04 DIAGNOSIS — I129 Hypertensive chronic kidney disease with stage 1 through stage 4 chronic kidney disease, or unspecified chronic kidney disease: Secondary | ICD-10-CM | POA: Diagnosis not present

## 2016-03-04 HISTORY — DX: Polyneuropathy, unspecified: G62.9

## 2016-03-04 HISTORY — DX: Other chronic pain: G89.29

## 2016-03-04 HISTORY — DX: Dorsalgia, unspecified: M54.9

## 2016-03-04 HISTORY — DX: Sciatica, unspecified side: M54.30

## 2016-03-04 LAB — COMPREHENSIVE METABOLIC PANEL
ALBUMIN: 4 g/dL (ref 3.5–5.0)
ALK PHOS: 73 U/L (ref 38–126)
ALT: 20 U/L (ref 14–54)
AST: 23 U/L (ref 15–41)
Anion gap: 7 (ref 5–15)
BILIRUBIN TOTAL: 0.3 mg/dL (ref 0.3–1.2)
BUN: 5 mg/dL — AB (ref 6–20)
CALCIUM: 9.3 mg/dL (ref 8.9–10.3)
CO2: 27 mmol/L (ref 22–32)
Chloride: 105 mmol/L (ref 101–111)
Creatinine, Ser: 0.63 mg/dL (ref 0.44–1.00)
GFR calc Af Amer: >60 mL/min (ref >60)
GFR calc non Af Amer: >60 mL/min (ref >60)
GLUCOSE: 108 mg/dL — AB (ref 65–99)
POTASSIUM: 3.1 mmol/L — AB (ref 3.5–5.1)
Sodium: 139 mmol/L (ref 135–145)
TOTAL PROTEIN: 6.8 g/dL (ref 6.5–8.1)

## 2016-03-04 LAB — URINALYSIS, ROUTINE W REFLEX MICROSCOPIC
BILIRUBIN URINE: NEGATIVE
Glucose, UA: NEGATIVE mg/dL
KETONES UR: NEGATIVE mg/dL
Leukocytes, UA: NEGATIVE
NITRITE: NEGATIVE
PH: 6.5 (ref 5.0–8.0)
Protein, ur: NEGATIVE mg/dL
Specific Gravity, Urine: <1.005 — ABNORMAL LOW (ref 1.005–1.030)

## 2016-03-04 LAB — CBC
HEMATOCRIT: 43.7 % (ref 36.0–46.0)
Hemoglobin: 14.6 g/dL (ref 12.0–15.0)
MCH: 31.9 pg (ref 26.0–34.0)
MCHC: 33.4 g/dL (ref 30.0–36.0)
MCV: 95.6 fL (ref 78.0–100.0)
Platelets: 227 10*3/uL (ref 150–400)
RBC: 4.57 MIL/uL (ref 3.87–5.11)
RDW: 13 % (ref 11.5–15.5)
WBC: 6.4 10*3/uL (ref 4.0–10.5)

## 2016-03-04 LAB — LIPASE, BLOOD: Lipase: 19 U/L (ref 11–51)

## 2016-03-04 LAB — URINE MICROSCOPIC-ADD ON: WBC UA: NONE SEEN WBC/hpf (ref 0–5)

## 2016-03-04 MED ORDER — HYDROCODONE-ACETAMINOPHEN 5-325 MG PO TABS
2.0000 | ORAL_TABLET | Freq: Once | ORAL | Status: AC
  Administered 2016-03-04: 2 via ORAL
  Filled 2016-03-04: qty 2

## 2016-03-04 MED ORDER — HYDROCODONE-ACETAMINOPHEN 5-325 MG PO TABS: 2.0000 | ORAL_TABLET | ORAL | 0 refills | Status: DC | PRN

## 2016-03-04 MED ORDER — POTASSIUM CHLORIDE 20 MEQ PO PACK
40.0000 meq | PACK | Freq: Once | ORAL | Status: AC
  Administered 2016-03-04: 40 meq via ORAL
  Filled 2016-03-04: qty 2

## 2016-03-04 NOTE — ED Provider Notes (Signed)
[facility] DEPT Provider Note   CSN: [id number] Arrival date & time: 03/04/16  1231     History   Chief Complaint Chief Complaint  Patient presents with  . Diarrhea  . Back Pain    HPI Rebecca [name] is a 65 y.o. female.  HPI  This patient is a 65 year old female who is presenting today with right lower back pain which started in the last couple of days, she reports that she has every medical condition known to man, when I question that she is insistent that she has had a recent problem ever created. She reports that her back pain is chronic though never really in this location, she states that it is in the right lower back and the upper buttock, there is a tingling sensation that runs down her leg towards her knee but no pain or weakness in the leg, this pain is totally absent unless she moves in which case the pain becomes acute, sharp and associated with some muscle jerking in her arms and legs. She reports that the only thing that helps is Vicodin, she reports that she has had multiple doctors tell her that she has herniated discs at every single bone in her body.  She denies any systemic symptoms and denies any urinary retention or incontinence, denies any burning or frequency, denies fevers chills nausea or vomiting. She does report a history of diarrhea that started last night and has a history of colitis as well. She denies any blood in the stools and at this time denies any abdominal pain.  Past Medical History:  Diagnosis Date  . Allergy   . Anxiety   . Arthritis   . Chronic back pain   . Chronic kidney disease    cyst on bil kidneys  . Colitis, ischemic ([place])   . Depression   . Dysphagia   . Fibromyalgia   . GERD (gastroesophageal reflux disease)   . Hiatal hernia   . Hyperlipidemia   . Hypertension   . Myocardial infarction   . 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 Peripheral neuropathy ([place])   . Schatzki's ring   . Sciatica   .  Sensorineural hearing loss   . Tinnitus     Patient Active Problem List   Diagnosis Date Noted  . BACK STRAIN 03/17/2010  . HEMATURIA UNSPECIFIED 02/24/2010  . DYSPAREUNIA 02/24/2010  . OTHER SPEC HYPERTROPHIC&ATROPHIC CONDITION SKIN 02/24/2010  . DYSURIA 11/20/2008  . HYPERLIPIDEMIA 10/30/2008  . ALLERGIC RHINITIS 12/26/2007  . HEADACHE 11/27/2007  . RESTLESS LEG SYNDROME 10/03/2007  . CONSTIPATION 10/03/2007  . [place], FEMALE 10/03/2007  . 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 ANXIETY DEPRESSION 08/01/2007  . TOBACCO ABUSE 08/01/2007  . ISCHEMIC COLITIS 08/01/2007  . [place] DISEASE, LUMBOSACRAL SPINE 08/01/2007    Past Surgical History:  Procedure Laterality Date  . ABDOMINAL HYSTERECTOMY    . BACK SURGERY    . BLADDER SURGERY    . BREAST SURGERY    . carple tunnal  surgery     Right hand  . NECK SURGERY     cyst on neck  . pillonidal cyst     resection    OB History    No data available       Home Medications    Prior to Admission medications   Medication Sig Start Date End Date Taking? Authorizing Provider  acetaminophen (TYLENOL) 500 MG tablet Take 500 mg by mouth every 6 (six) hours  as needed for moderate pain.    Historical Provider, MD  albuterol (PROAIR HFA) 108 (90 BASE) MCG/ACT inhaler Inhale 2 puffs into the lungs every 6 (six) hours as needed for wheezing. Please dispense 3 month supply 07/06/11 02/17/16  Sarah T [place], MD  ALPRAZolam Duanne Moron) 0.5 MG tablet Take 1 tablet (0.5 mg total) by mouth 3 (three) times daily. 02/17/16 02/16/17  Cloria Spring, MD  aspirin EC 81 MG tablet Take 81 mg by mouth daily.    Historical Provider, MD  clobetasol ointment (TEMOVATE) 2.50 % Apply 1 application topically 2 (two) times daily as needed (irritation).     Historical Provider, MD  Cyanocobalamin (VITAMIN B-12 IJ) Inject as directed every 30 (thirty) days.    Historical Provider, MD  cyclobenzaprine (FLEXERIL) 10 MG tablet Take 10  mg by mouth daily as needed for muscle spasms.     Historical Provider, MD  HYDROcodone-acetaminophen (NORCO/VICODIN) 5-325 MG tablet Take 2 tablets by mouth every 4 (four) hours as needed. 03/04/16   Noemi Chapel, MD  hydrOXYzine (ATARAX/VISTARIL) 25 MG tablet Take 1 tablet (25 mg total) by mouth 3 (three) times daily as needed. 02/17/16   Cloria Spring, MD  Meclizine HCl (ANTIVERT PO) Take by mouth as needed (for Dizziness).    Historical Provider, MD  mometasone (NASONEX) 50 MCG/ACT nasal spray Place 2 sprays into the nose daily as needed (congestion). As needed 02/09/11 05/17/15  Sarah T Martinique, MD  montelukast (SINGULAIR) 10 MG tablet Take 10 mg by mouth at bedtime.    Historical Provider, MD  promethazine (PHENERGAN) 25 MG tablet Take 1 tablet (25 mg total) by mouth every 6 (six) hours as needed for nausea. Patient not taking: Reported on 02/17/2016 10/14/11 05/17/15  Noemi Chapel, MD  sodium chloride (OCEAN) 0.65 % SOLN nasal spray Place 1 spray into both nostrils as needed for congestion.    Historical Provider, MD    Family History Family History  Problem Relation Age of Onset  . Anemia    . Cancer    . Diabetes    . Sleep apnea    . Alcohol abuse Father   . Depression Father   . Colon cancer Neg Hx   . Esophageal cancer Neg Hx   . Stomach cancer Neg Hx     Social History Social History  Substance Use Topics  . Smoking status: Current Some Day Smoker    Packs/day: 0.75    Years: 44.00    Types: Cigarettes  . Smokeless tobacco: Never Used     Comment: gave patient a Be stronger than your excuses card  . Alcohol use No     Allergies   Bupropion hcl; Cephalexin; Penicillins; Toradol [ketorolac tromethamine]; and Darvocet [propoxyphene n-acetaminophen]   Review of Systems Review of Systems  All other systems reviewed and are negative.    Physical Exam Updated Vital Signs BP 157/68 (BP Location: Right Arm)   Pulse 86   Temp 98.1 F (36.7 C)   Resp 18   Ht 5' 5"   (1.651 m)   Wt 141 lb (64 kg)   SpO2 99%   BMI 23.46 kg/m   Physical Exam  Constitutional: She appears well-developed and well-nourished. No distress.  HENT:  Head: Normocephalic and atraumatic.  Mouth/Throat: Oropharynx is clear and moist. No oropharyngeal exudate.  Eyes: Conjunctivae and EOM are normal. Pupils are equal, round, and reactive to light. Right eye exhibits no discharge. Left eye exhibits no discharge. No scleral icterus.  Neck:  Normal range of motion. Neck supple. No JVD present. No thyromegaly present.  Cardiovascular: Normal rate, regular rhythm, normal heart sounds and intact distal pulses.  Exam reveals no gallop and no friction rub.   No murmur heard. Pulmonary/Chest: Effort normal and breath sounds normal. No respiratory distress. She has no wheezes. She has no rales.  Abdominal: Soft. Bowel sounds are normal. She exhibits no distension and no mass. There is no tenderness.  Musculoskeletal: Normal range of motion. She exhibits tenderness ( There is focal tenderness over the sacroiliac joint of the right lower back as well as the right mid buttock). She exhibits no edema.  Lymphadenopathy:    She has no cervical adenopathy.  Neurological: She is alert. Coordination normal.  The patient is able to straight leg raise bilaterally, she has normal strength in all 4 extremities at major joints and has normal sensation of the bilateral lower extremities.  Skin: Skin is warm and dry. No rash noted. No erythema.  Psychiatric: She has a normal mood and affect. Her behavior is normal.  Nursing note and vitals reviewed.    ED Treatments / Results  Labs (all labs ordered are listed, but only abnormal results are displayed) Labs Reviewed  COMPREHENSIVE METABOLIC PANEL - Abnormal; Notable for the following:       Result Value   Potassium 3.1 (*)    Glucose, Bld 108 (*)    BUN 5 (*)    All other components within normal limits  URINALYSIS, ROUTINE W REFLEX MICROSCOPIC (NOT  AT [facility]) - Abnormal; Notable for the following:    Specific Gravity, Urine <1.005 (*)    Hgb urine dipstick MODERATE (*)    All other components within normal limits  URINE MICROSCOPIC-ADD ON - Abnormal; Notable for the following:    Squamous Epithelial / LPF 0-5 (*)    Bacteria, UA RARE (*)    All other components within normal limits  LIPASE, BLOOD  CBC     Radiology No results found.  Procedures Procedures (including critical care time)  Medications Ordered in ED Medications  potassium chloride (KLOR-CON) packet 40 mEq (40 mEq Oral Given 03/04/16 1555)  HYDROcodone-acetaminophen (NORCO/VICODIN) 5-325 MG per tablet 2 tablet (2 tablets Oral Given 03/04/16 1555)     Initial Impression / Assessment and Plan / ED Course  I have reviewed the triage vital signs and the nursing notes.  Pertinent labs & imaging results that were available during my care of the patient were reviewed by me and considered in my medical decision making (see chart for details).  Clinical Course    The etiology of the patient's symptoms is likely from sacroiliitis, the cause of her muscle fasciculations, or the muscle jerking could be related in someway to the pain as well as a slight low potassium, this will be replaced, the patient has no need for neuro imaging, spinal imaging or plain radiographs as they would not change the management at this time. This does not appear to be a radiculopathy or related to herniated disc disease and there is no sign of cauda equina  Potassium repleted, pain medications given, the patient opened a bag of medications that she has with her and she has 2 different muscle relaxants, I have encouraged her to use only one of them, she agreed and expressed her understanding. She does not appear to have an acute life-threatening problem, she was made aware of indications for return  Final Clinical Impressio is worsns(s) / ED Diagnoses   Final  diagnoses:  Acute right-sided low  back pain without sciatica  Diarrhea, unspecified type    New Prescriptions New Prescriptions   HYDROCODONE-ACETAMINOPHEN (NORCO/VICODIN) 5-325 MG TABLET    Take 2 tablets by mouth every 4 (four) hours as needed.     Noemi Chapel, MD 03/04/16 [phone]

## 2016-03-04 NOTE — ED Triage Notes (Signed)
Pt reports right lower back "all my life."  Pt states pain is caused by overexertion, stress, etc.  Pt denies urinary issues at this time.  Pt also having diarrhea x2 days, denies blood in it.  Pt alert and oriented.  Pt also having lower abdomen pain when having a BM, has also been nauseated.

## 2016-03-04 NOTE — Discharge Instructions (Signed)
Please take the medications exactly as prescribed.  That means Robaxin or methocarbamol twice a day as needed for muscle spasms, Hydrocodone for severe pain every 6 hours.  You will likely need to see the physical therapist for further evaluation of your back pain, this appears to be sacroiliitis which usually respond to anti-inflammatory such as ibuprofen and physical therapy with stretching. Have your family doctor arrange this for you.

## 2016-05-02 ENCOUNTER — Ambulatory Visit: Payer: Self-pay | Admitting: Internal Medicine

## 2016-05-11 ENCOUNTER — Encounter (HOSPITAL_COMMUNITY): Payer: Self-pay | Admitting: Psychiatry

## 2016-05-11 ENCOUNTER — Ambulatory Visit (INDEPENDENT_AMBULATORY_CARE_PROVIDER_SITE_OTHER): Payer: Medicare Other | Admitting: Psychiatry

## 2016-05-11 VITALS — BP 145/86 | HR 77 | Ht 65.0 in | Wt 139.6 lb

## 2016-05-11 DIAGNOSIS — Z888 Allergy status to other drugs, medicaments and biological substances status: Secondary | ICD-10-CM

## 2016-05-11 DIAGNOSIS — F411 Generalized anxiety disorder: Secondary | ICD-10-CM | POA: Diagnosis not present

## 2016-05-11 DIAGNOSIS — Z808 Family history of malignant neoplasm of other organs or systems: Secondary | ICD-10-CM

## 2016-05-11 DIAGNOSIS — Z7982 Long term (current) use of aspirin: Secondary | ICD-10-CM

## 2016-05-11 DIAGNOSIS — F331 Major depressive disorder, recurrent, moderate: Secondary | ICD-10-CM | POA: Diagnosis not present

## 2016-05-11 DIAGNOSIS — Z833 Family history of diabetes mellitus: Secondary | ICD-10-CM | POA: Diagnosis not present

## 2016-05-11 DIAGNOSIS — Z880 Allergy status to penicillin: Secondary | ICD-10-CM | POA: Diagnosis not present

## 2016-05-11 DIAGNOSIS — Z811 Family history of alcohol abuse and dependence: Secondary | ICD-10-CM

## 2016-05-11 DIAGNOSIS — Z79899 Other long term (current) drug therapy: Secondary | ICD-10-CM

## 2016-05-11 MED ORDER — SERTRALINE HCL 25 MG PO TABS: 25.0000 mg | ORAL_TABLET | Freq: Two times a day (BID) | ORAL | 2 refills | Status: DC

## 2016-05-11 MED ORDER — ALPRAZOLAM 0.5 MG PO TABS: 0.5000 mg | ORAL_TABLET | Freq: Three times a day (TID) | ORAL | 2 refills | Status: DC
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
## 2016-05-12 ENCOUNTER — Ambulatory Visit (HOSPITAL_COMMUNITY): Payer: Self-pay | Admitting: Psychiatry

## 2016-05-29 ENCOUNTER — Other Ambulatory Visit (HOSPITAL_COMMUNITY): Payer: Self-pay | Admitting: Adult Health Nurse Practitioner

## 2016-05-29 DIAGNOSIS — N644 Mastodynia: Secondary | ICD-10-CM

## 2016-05-29 DIAGNOSIS — N6019 Diffuse cystic mastopathy of unspecified breast: Secondary | ICD-10-CM

## 2016-06-02 ENCOUNTER — Other Ambulatory Visit (HOSPITAL_COMMUNITY): Payer: Self-pay | Admitting: Adult Health Nurse Practitioner

## 2016-06-02 DIAGNOSIS — N644 Mastodynia: Secondary | ICD-10-CM

## 2016-06-13 ENCOUNTER — Encounter (HOSPITAL_COMMUNITY): Payer: Self-pay

## 2016-06-13 ENCOUNTER — Ambulatory Visit (HOSPITAL_COMMUNITY)
Admission: RE | Admit: 2016-06-13 | Discharge: 2016-06-13 | Disposition: A | Discharge status: Home / Self Care | Payer: Self-pay | Source: Ambulatory Visit | Attending: Adult Health Nurse Practitioner | Admitting: Adult Health Nurse Practitioner

## 2016-06-13 DIAGNOSIS — N644 Mastodynia: Secondary | ICD-10-CM

## 2016-06-20 ENCOUNTER — Telehealth (HOSPITAL_COMMUNITY): Payer: Self-pay | Admitting: *Deleted

## 2016-06-20 ENCOUNTER — Ambulatory Visit (HOSPITAL_COMMUNITY): Payer: Self-pay | Admitting: Psychiatry

## 2016-06-20 NOTE — Telephone Encounter (Signed)
Patient wants to know if she can come in April instead of March due to her copay.

## 2016-06-20 NOTE — Telephone Encounter (Signed)
Per pt previous call about rescheduling her appt for April.

## 2016-06-20 NOTE — Telephone Encounter (Signed)
Noted  

## 2016-06-20 NOTE — Telephone Encounter (Signed)
ok 

## 2016-06-21 NOTE — Telephone Encounter (Signed)
lmtcb

## 2016-06-29 ENCOUNTER — Ambulatory Visit (HOSPITAL_COMMUNITY): Payer: Self-pay | Admitting: Psychiatry

## 2016-07-12 ENCOUNTER — Ambulatory Visit (INDEPENDENT_AMBULATORY_CARE_PROVIDER_SITE_OTHER): Payer: Medicare HMO | Admitting: Psychiatry

## 2016-07-12 ENCOUNTER — Encounter (HOSPITAL_COMMUNITY): Payer: Self-pay | Admitting: Psychiatry

## 2016-07-12 VITALS — BP 133/81 | HR 93 | Ht 65.0 in | Wt 140.6 lb

## 2016-07-12 DIAGNOSIS — Z818 Family history of other mental and behavioral disorders: Secondary | ICD-10-CM

## 2016-07-12 DIAGNOSIS — Z811 Family history of alcohol abuse and dependence: Secondary | ICD-10-CM

## 2016-07-12 DIAGNOSIS — F331 Major depressive disorder, recurrent, moderate: Secondary | ICD-10-CM | POA: Diagnosis not present

## 2016-07-12 DIAGNOSIS — F411 Generalized anxiety disorder: Secondary | ICD-10-CM

## 2016-07-12 MED ORDER — SERTRALINE HCL 50 MG PO TABS: 50.0000 mg | ORAL_TABLET | Freq: Two times a day (BID) | ORAL | 2 refills | Status: DC

## 2016-07-12 MED ORDER — ALPRAZOLAM 0.5 MG PO TABS: 0.5000 mg | ORAL_TABLET | Freq: Three times a day (TID) | ORAL | 2 refills | Status: DC
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
## 2016-08-07 ENCOUNTER — Other Ambulatory Visit (HOSPITAL_COMMUNITY): Payer: Self-pay | Admitting: Psychiatry

## 2016-10-02 ENCOUNTER — Ambulatory Visit (HOSPITAL_COMMUNITY): Payer: Self-pay | Admitting: Psychiatry

## 2016-10-10 ENCOUNTER — Ambulatory Visit (HOSPITAL_COMMUNITY): Payer: Self-pay | Admitting: Psychiatry

## 2016-10-25 ENCOUNTER — Ambulatory Visit (INDEPENDENT_AMBULATORY_CARE_PROVIDER_SITE_OTHER): Payer: Medicare HMO | Admitting: Psychiatry

## 2016-10-25 ENCOUNTER — Encounter (HOSPITAL_COMMUNITY): Payer: Self-pay | Admitting: Psychiatry

## 2016-10-25 VITALS — BP 122/78 | HR 92 | Ht 65.0 in | Wt 143.2 lb

## 2016-10-25 DIAGNOSIS — F331 Major depressive disorder, recurrent, moderate: Secondary | ICD-10-CM

## 2016-10-25 DIAGNOSIS — Z818 Family history of other mental and behavioral disorders: Secondary | ICD-10-CM

## 2016-10-25 DIAGNOSIS — Z811 Family history of alcohol abuse and dependence: Secondary | ICD-10-CM | POA: Diagnosis not present

## 2016-10-25 DIAGNOSIS — F411 Generalized anxiety disorder: Secondary | ICD-10-CM

## 2016-10-25 MED ORDER — SERTRALINE HCL 50 MG PO TABS: 50.0000 mg | ORAL_TABLET | Freq: Two times a day (BID) | ORAL | 2 refills | Status: DC

## 2016-10-25 MED ORDER — ALPRAZOLAM 0.5 MG PO TABS: 0.5000 mg | ORAL_TABLET | Freq: Four times a day (QID) | ORAL | 2 refills | Status: DC
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
## 2017-01-04 ENCOUNTER — Telehealth (HOSPITAL_COMMUNITY): Payer: Self-pay | Admitting: *Deleted

## 2017-01-04 ENCOUNTER — Other Ambulatory Visit (HOSPITAL_COMMUNITY): Payer: Self-pay | Admitting: Psychiatry

## 2017-01-04 MED ORDER — SERTRALINE HCL 50 MG PO TABS: 50.0000 mg | ORAL_TABLET | Freq: Two times a day (BID) | ORAL | 2 refills | Status: DC

## 2017-01-04 NOTE — Telephone Encounter (Signed)
Office received fax from pt pharmacy [facility] in [place] for a 90 days supply of pt Sertraline HCL 50 mg BID. Per pt chart, pt medication was last filled on 10-25-2016 with 60 tabs 2 refills. Pt f/u appt is 01-25-2017. Pt pharmacy number is [phone].

## 2017-01-04 NOTE — Telephone Encounter (Signed)
noted 

## 2017-01-04 NOTE — Telephone Encounter (Signed)
sent 

## 2017-01-25 ENCOUNTER — Ambulatory Visit (HOSPITAL_COMMUNITY): Payer: Self-pay | Admitting: Psychiatry

## 2017-02-07 ENCOUNTER — Encounter (HOSPITAL_COMMUNITY): Payer: Self-pay | Admitting: Psychiatry

## 2017-02-07 ENCOUNTER — Ambulatory Visit (INDEPENDENT_AMBULATORY_CARE_PROVIDER_SITE_OTHER): Payer: Medicare HMO | Admitting: Psychiatry

## 2017-02-07 VITALS — BP 122/78 | HR 80 | Ht 65.0 in | Wt 144.6 lb

## 2017-02-07 DIAGNOSIS — R450 Nervousness: Secondary | ICD-10-CM | POA: Diagnosis not present

## 2017-02-07 DIAGNOSIS — Z62810 Personal history of physical and sexual abuse in childhood: Secondary | ICD-10-CM | POA: Diagnosis not present

## 2017-02-07 DIAGNOSIS — Z811 Family history of alcohol abuse and dependence: Secondary | ICD-10-CM

## 2017-02-07 DIAGNOSIS — Z818 Family history of other mental and behavioral disorders: Secondary | ICD-10-CM

## 2017-02-07 DIAGNOSIS — F17210 Nicotine dependence, cigarettes, uncomplicated: Secondary | ICD-10-CM

## 2017-02-07 DIAGNOSIS — Z91410 Personal history of adult physical and sexual abuse: Secondary | ICD-10-CM | POA: Diagnosis not present

## 2017-02-07 DIAGNOSIS — R251 Tremor, unspecified: Secondary | ICD-10-CM

## 2017-02-07 DIAGNOSIS — F331 Major depressive disorder, recurrent, moderate: Secondary | ICD-10-CM

## 2017-02-07 DIAGNOSIS — F419 Anxiety disorder, unspecified: Secondary | ICD-10-CM

## 2017-02-07 DIAGNOSIS — M549 Dorsalgia, unspecified: Secondary | ICD-10-CM

## 2017-02-07 MED ORDER — DIAZEPAM 2 MG PO TABS: 2.0000 mg | ORAL_TABLET | Freq: Four times a day (QID) | ORAL | 2 refills | Status: DC

## 2017-02-07 MED ORDER — SERTRALINE HCL 50 MG PO TABS: 50.0000 mg | ORAL_TABLET | Freq: Two times a day (BID) | ORAL | 2 refills | Status: DC
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
## 2017-02-21 ENCOUNTER — Other Ambulatory Visit (HOSPITAL_COMMUNITY): Payer: Self-pay | Admitting: Psychiatry

## 2017-02-21 ENCOUNTER — Telehealth (HOSPITAL_COMMUNITY): Payer: Self-pay | Admitting: *Deleted

## 2017-02-21 ENCOUNTER — Other Ambulatory Visit (HOSPITAL_COMMUNITY): Payer: Self-pay | Admitting: *Deleted

## 2017-02-21 MED ORDER — ALPRAZOLAM 0.5 MG PO TABS: 0.5000 mg | ORAL_TABLET | Freq: Four times a day (QID) | ORAL | 2 refills | Status: DC

## 2017-02-21 NOTE — Telephone Encounter (Signed)
Patient called in stating that she had a reaction to the Valium & prefers to go back on the Xanax

## 2017-02-21 NOTE — Telephone Encounter (Signed)
Please call in xanax 0.5 mg, #120, one qid, 2 refills

## 2017-04-23 ENCOUNTER — Encounter: Payer: Self-pay | Admitting: *Deleted

## 2017-04-25 ENCOUNTER — Encounter: Payer: Self-pay | Admitting: Obstetrics and Gynecology

## 2017-04-25 ENCOUNTER — Ambulatory Visit: Payer: Medicare HMO | Admitting: Obstetrics and Gynecology

## 2017-04-25 DIAGNOSIS — N736 Female pelvic peritoneal adhesions (postinfective): Secondary | ICD-10-CM | POA: Insufficient documentation

## 2017-04-25 DIAGNOSIS — R102 Pelvic and perineal pain: Secondary | ICD-10-CM

## 2017-04-25 DIAGNOSIS — R319 Hematuria, unspecified: Secondary | ICD-10-CM

## 2017-04-25 DIAGNOSIS — M549 Dorsalgia, unspecified: Secondary | ICD-10-CM | POA: Diagnosis not present

## 2017-04-25 DIAGNOSIS — N941 Unspecified dyspareunia: Secondary | ICD-10-CM

## 2017-04-25 DIAGNOSIS — G8929 Other chronic pain: Secondary | ICD-10-CM | POA: Diagnosis not present

## 2017-04-25 NOTE — Progress Notes (Addendum)
Patient ID: JOLEIGH MINEAU, female   DOB: [birth year], 67 y.o.   MRN: [id number]   [facility] Visit  @DATE @            Patient name: Rebecca [name] MRN [id number]  Date of birth: 21-Apr-1950  CC & HPI:  Rebecca [name] is a 67 y.o. female presenting today for chronic pelvic pain for the last five years. She believes that her "problems" started about 5 years ago when she was sitting in a chair and she suddenly fell through. She states that she had cystocele surgery prior to falling and it was fine until she fell through the chair. Associated symptoms include hematuria, back pain and dyspareunia. When he goes to the bathroom he has to be careful to not have a bowel movement. She feels as if her stools are halfway out. The patient is only able to have intercourse when she is lying on her right side,, as she has pain at the left side of the vaginal apex otherwise she experiences pain.  That makes her stop efforts and intimacy  the patient report that her hearing is going.  Family history of deafness  she denies fever, chills or any other symptoms or complaints at this time.  ROS:  ROS +pelvic pain +hematuria +back pain -fever -chills All systems are negative except as noted in the HPI and PMH.   Pertinent History Reviewed:   Reviewed: Significant for bladder surgery, abdominal surgery Medical         Past Medical History:  Diagnosis Date  . Allergy   . Anxiety   . Arthritis   . Chronic back pain   . Chronic kidney disease    cyst on bil kidneys  . Colitis, ischemic ([facility])   . Depression   . Dysphagia   . Fibromyalgia   . GERD (gastroesophageal reflux disease)   . Hiatal hernia   . Hyperlipidemia   . Hypertension   . Myocardial infarction ([place])   . 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 Peripheral neuropathy   . Schatzki's ring   . Sciatica   . Sensorineural hearing loss   . Tinnitus                               Surgical Hx:    Past Surgical  History:  Procedure Laterality Date  . ABDOMINAL HYSTERECTOMY    . BACK SURGERY    . BLADDER SURGERY    . BREAST SURGERY    . carple tunnal  surgery     Right hand  . NECK SURGERY     cyst on neck  . pillonidal cyst     resection   Medications: Reviewed & Updated - see associated section                       Current Outpatient Medications:  .  acetaminophen (TYLENOL) 500 MG tablet, Take 500 mg by mouth every 6 (six) hours as needed for moderate pain., Disp: , Rfl:  .  ALPRAZolam (XANAX) 0.5 MG tablet, Take 1 tablet (0.5 mg total) 4 (four) times daily by mouth., Disp: 120 tablet, Rfl: 2 .  cetirizine (ZYRTEC) 10 MG tablet, Take 10 mg by mouth as needed. , Disp: , Rfl:  .  conjugated estrogens (PREMARIN) vaginal cream, Place 1 Applicatorful vaginally daily. , Disp: , Rfl:  .  Cyanocobalamin (VITAMIN B-12  IJ), Inject as directed every 30 (thirty) days., Disp: , Rfl:  .  fluticasone (FLONASE) 50 MCG/ACT nasal spray, one spray by Both Nostrils prn, Disp: , Rfl:  .  sertraline (ZOLOFT) 50 MG tablet, Take 1 tablet (50 mg total) by mouth 2 (two) times daily., Disp: 180 tablet, Rfl: 2 .  sodium chloride (OCEAN) 0.65 % SOLN nasal spray, Place 1 spray into both nostrils as needed for congestion., Disp: , Rfl:  .  mometasone (NASONEX) 50 MCG/ACT nasal spray, Place 2 sprays into the nose daily as needed (congestion). As needed, Disp: , Rfl:    Social History: Reviewed -  reports that she has been smoking cigarettes.  She has a 33.00 pack-year smoking history. she has never used smokeless tobacco.  Objective Findings:  Vitals: Blood pressure 120/72, pulse 94, height 5' 5.5" (1.664 m), weight 149 lb (67.6 kg).  PHYSICAL EXAMINATION General appearance - alert, well appearing, and in no distress and oriented to person, place, and time Mental status - alert, oriented to person, place, and time, normal mood, behavior, speech, dress, motor activity, and thought processes, affect appropriate to  mood  PELVIC External genitalia - normal Vagina - tender spot to top of left vagina, vaginal length is short, firm nodule on right vaginal cuff at 3 o'clock that is tender, fairly good support, vault is empty Cervix - surgically absent Uterus - surgically absent  Assessment & Plan:   A: 1.  Vaginal cuff nodule at the left uterosacral ligament attachment, severely tender to palpation 2.  Dyspareunia due to short vaginal length and and inflexible vaginal apex, particularly tender at the left corner of the apex   P:  1. F/U 4 weeks for follow-up conversation Make a list of the top 5 problems she wants addressed By signing my name below, I, Margit Banda, attest that this documentation has been prepared under the direction and in the presence of Jonnie Kind, MD. Electronically Signed: Margit Banda, Medical Scribe. 04/25/17. 2:06 PM.  I personally performed the services described in this documentation, which was SCRIBED in my presence. The recorded information has been reviewed and considered accurate. It has been edited as necessary during review. Jonnie Kind, MD

## 2017-05-07 ENCOUNTER — Ambulatory Visit (HOSPITAL_COMMUNITY): Payer: Self-pay | Admitting: Psychiatry

## 2017-05-17 ENCOUNTER — Encounter (HOSPITAL_COMMUNITY): Payer: Self-pay | Admitting: Psychiatry

## 2017-05-17 ENCOUNTER — Ambulatory Visit (HOSPITAL_COMMUNITY): Payer: Medicare HMO | Admitting: Psychiatry

## 2017-05-17 VITALS — BP 121/70 | HR 76 | Ht 65.5 in | Wt 147.0 lb

## 2017-05-17 DIAGNOSIS — Z62810 Personal history of physical and sexual abuse in childhood: Secondary | ICD-10-CM | POA: Diagnosis not present

## 2017-05-17 DIAGNOSIS — Z818 Family history of other mental and behavioral disorders: Secondary | ICD-10-CM

## 2017-05-17 DIAGNOSIS — F17210 Nicotine dependence, cigarettes, uncomplicated: Secondary | ICD-10-CM

## 2017-05-17 DIAGNOSIS — M549 Dorsalgia, unspecified: Secondary | ICD-10-CM

## 2017-05-17 DIAGNOSIS — F331 Major depressive disorder, recurrent, moderate: Secondary | ICD-10-CM

## 2017-05-17 DIAGNOSIS — Z811 Family history of alcohol abuse and dependence: Secondary | ICD-10-CM

## 2017-05-17 DIAGNOSIS — Z915 Personal history of self-harm: Secondary | ICD-10-CM | POA: Diagnosis not present

## 2017-05-17 DIAGNOSIS — Z91410 Personal history of adult physical and sexual abuse: Secondary | ICD-10-CM

## 2017-05-17 MED ORDER — SERTRALINE HCL 50 MG PO TABS: 50.0000 mg | ORAL_TABLET | Freq: Two times a day (BID) | ORAL | 2 refills | Status: DC

## 2017-05-17 MED ORDER — ALPRAZOLAM 0.5 MG PO TABS: 0.5000 mg | ORAL_TABLET | Freq: Four times a day (QID) | ORAL | 2 refills | Status: DC
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
## 2017-05-23 ENCOUNTER — Encounter: Payer: Self-pay | Admitting: Obstetrics and Gynecology

## 2017-05-23 ENCOUNTER — Ambulatory Visit: Payer: Medicare HMO | Admitting: Obstetrics and Gynecology

## 2017-05-23 ENCOUNTER — Other Ambulatory Visit: Payer: Self-pay

## 2017-05-23 VITALS — BP 134/76 | HR 81 | Ht 65.0 in | Wt 147.0 lb

## 2017-05-23 DIAGNOSIS — K5900 Constipation, unspecified: Secondary | ICD-10-CM | POA: Diagnosis not present

## 2017-05-23 DIAGNOSIS — R1032 Left lower quadrant pain: Secondary | ICD-10-CM | POA: Diagnosis not present

## 2017-05-23 DIAGNOSIS — G8929 Other chronic pain: Secondary | ICD-10-CM | POA: Diagnosis not present

## 2017-05-23 DIAGNOSIS — R102 Pelvic and perineal pain: Secondary | ICD-10-CM

## 2017-05-23 DIAGNOSIS — N9412 Deep dyspareunia: Secondary | ICD-10-CM | POA: Diagnosis not present

## 2017-05-23 NOTE — Progress Notes (Signed)
[facility] Visit  05/23/2017            Patient name: Rebecca [name] MRN [id number]  Date of birth: 20-Apr-1950  CC & HPI:  Rebecca [name] is a 67 y.o. female presenting today for follow up appointment for conversation about top 5 problem she wants addressed.  She was very scattered and her priorities with multiple.  Issues all mentioned equally at her last visit she was last seen in office on 04/25/17 for dyspareunia due to short vaginal length and inflexible vaginal apex, and a vaginal cuff nodule at the left uterosacral ligament attachment, severely tender to palpation. She has brought in her list today. She still continues to experience dyspareunia. She has been urinary and bowel movement issues (constipation). She is unable to void when sitting on the toilet, but will have the urge to go as soon as she lays down.   She has had a colonoscopy about 7 years ago.   ROS:  ROS (+) left flank and LLQ abdominal pain (+) dyspareunia (+) issues with bowel movements  Pertinent History Reviewed:   Reviewed: Significant for abdominal hysterectomy, cystocele, rectocele, bladder surgery, D&C Medical         Past Medical History:  Diagnosis Date  . Allergy   . Anxiety   . Arthritis   . Chronic back pain   . Chronic kidney disease    cyst on bil kidneys  . Colitis, ischemic ([place])   . Depression   . Dysphagia   . Fibromyalgia   . GERD (gastroesophageal reflux disease)   . Hiatal hernia   . Hyperlipidemia   . Hypertension   . Myocardial infarction ([place])   . 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 Peripheral neuropathy   . Schatzki's ring   . Sciatica   . Sensorineural hearing loss   . Tinnitus                               Surgical Hx:    Past Surgical History:  Procedure Laterality Date  . ABDOMINAL HYSTERECTOMY    . BACK SURGERY    . BLADDER SURGERY    . BREAST SURGERY    . carple tunnal  surgery     Right hand  . NECK SURGERY     cyst on neck   . pillonidal cyst     resection   Medications: Reviewed & Updated - see associated section                       Current Outpatient Medications:  .  acetaminophen (TYLENOL) 500 MG tablet, Take 500 mg by mouth every 6 (six) hours as needed for moderate pain., Disp: , Rfl:  .  ALPRAZolam (XANAX) 0.5 MG tablet, Take 1 tablet (0.5 mg total) by mouth 4 (four) times daily., Disp: 120 tablet, Rfl: 2 .  cetirizine (ZYRTEC) 10 MG tablet, Take 10 mg by mouth as needed. , Disp: , Rfl:  .  conjugated estrogens (PREMARIN) vaginal cream, Place 1 Applicatorful vaginally daily. , Disp: , Rfl:  .  Cyanocobalamin (VITAMIN B-12 IJ), Inject as directed every 30 (thirty) days., Disp: , Rfl:  .  fluticasone (FLONASE) 50 MCG/ACT nasal spray, one spray by Both Nostrils prn, Disp: , Rfl:  .  sertraline (ZOLOFT) 50 MG tablet, Take 1 tablet (50 mg total) by mouth 2 (two) times daily., Disp:  180 tablet, Rfl: 2 .  sodium chloride (OCEAN) 0.65 % SOLN nasal spray, Place 1 spray into both nostrils as needed for congestion., Disp: , Rfl:  .  mometasone (NASONEX) 50 MCG/ACT nasal spray, Place 2 sprays into the nose daily as needed (congestion). As needed, Disp: , Rfl:    Social History: Reviewed -  reports that she has been smoking cigarettes.  She has smoked for the past 44.00 years. she has never used smokeless tobacco.  Objective Findings:  Vitals: Blood pressure 134/76, pulse 81, height 5' 5"  (1.651 m), weight 147 lb (66.7 kg).  PHYSICAL EXAMINATION General appearance - alert, well appearing, and in no distress, oriented to person, place, and time and normal appearing weight Mental status - alert, oriented to person, place, and time, normal mood, behavior, speech, dress, motor activity, and thought processes, depressed mood Chest - Heart -  Abdomen -  Breasts -  Skin -   PELVIC VULVA: normal appearing vulva with no masses, tenderness or lesions,  VAGINA: small firm nodules of granulation tissue in corner of  left vaginal area,  fairly small introitus, moderate vaginal length, normal appearing secretions, intercourse pain reproducible with palpation of left corner CERVIX: surgically absent,  UTERUS: surgically absent,  ADNEXA:  Rectal: good support   Discussion: 1. Discussed with pt her list of issues that she wants addressed. She states that any activities like bending to wash dishes or walking will make her pelvic area sore and tender. She will have to stop and sit down until the pressure goes away.  She states that she has been having issues with her bowel movements. She on and off of prednisone for pain management of her back. She has tried wearing a brace, but states it doesn't help. She has a hernia (possibly hiatal).  She still endorses dyspareunia. She has tried a pessary before. Discussion of removing the nodules discussed, as well as going back and doing adhesion lysis again.  At end of discussion, pt had opportunity to ask questions and has no further questions at this time.   Specific discussion as noted above. Greater than 50% was spent in counseling and coordination of care with the patient.   Total time greater than: 25 minutes.    Assessment & Plan:   A:  1. Dyspareunia due to granulation tissue scarring at the left vaginal cuff apex 2. Pelvic pressure without specific abnormality found 3. Chronic pelvic pain  3 areas of scar tissue in vagina Left flank and LLQ abdominal pain  P:  1.  For excision of vaginal cuff nodules, patient will call and schedule surgery for next week Tuesday or friday    By signing my name below, I, Izna Ahmed, attest that this documentation has been prepared under the direction and in the presence of Jonnie Kind, MD. Electronically Signed: Jabier Gauss, Medical Scribe. 05/23/17. 2:03 PM.  .Alecia Lemming I personally performed the services described in this documentation, which was SCRIBED in my presence. The recorded information has been reviewed  and considered accurate. It has been edited as necessary during review. Jonnie Kind, MD

## 2017-05-29 ENCOUNTER — Encounter: Payer: Self-pay | Admitting: *Deleted

## 2017-05-29 ENCOUNTER — Ambulatory Visit: Payer: Medicare HMO | Admitting: Obstetrics and Gynecology

## 2017-05-29 ENCOUNTER — Encounter: Payer: Self-pay | Admitting: Obstetrics and Gynecology

## 2017-05-29 ENCOUNTER — Other Ambulatory Visit: Payer: Self-pay

## 2017-05-29 VITALS — BP 136/84 | HR 87 | Ht 65.0 in | Wt 148.0 lb

## 2017-05-29 DIAGNOSIS — N898 Other specified noninflammatory disorders of vagina: Secondary | ICD-10-CM

## 2017-05-29 DIAGNOSIS — N941 Unspecified dyspareunia: Secondary | ICD-10-CM

## 2017-05-29 NOTE — Addendum Note (Signed)
Addended by: Gaylyn Rong A on: 05/29/2017 10:34 AM   Modules accepted: Orders

## 2017-05-29 NOTE — Progress Notes (Signed)
Vaginal cuff Biopsy Procedure Note  Pre-operative dyspareunia suspected due to vaginal cuff nodule  Post-operative Diagnosis: same  Indications: Dyspareunia vaginal cuff nodule with tenderness associated with contact patient's recently been having bowel colitis and associated pelvic discomfort including increased dyspareunia  Procedure Details  Speculum was inserted, topical benzocaine applied, allowing prepping with Betadine and injection of 3 cc of lidocaine in the left vaginal corner beneath the 3 small nodules that were tender upon palpation.  The patient tolerated this well.  Cervix biopsy instruments, tissular biopsy device was then used to excise the small hard firm nodules and leave a smooth vaginal cuff.  Specimens were sent off for histology though benign etiology is suspected topical silver nitrate applied and patient given instructions on follow-up care avoid intimacy times 2 weeks

## 2017-05-31 ENCOUNTER — Telehealth: Payer: Self-pay | Admitting: Obstetrics and Gynecology

## 2017-05-31 ENCOUNTER — Encounter: Payer: Self-pay | Admitting: Obstetrics and Gynecology

## 2017-05-31 NOTE — Telephone Encounter (Signed)
LMOVM that she could start using the cream. Advised to call back if she had further questions.

## 2017-05-31 NOTE — Telephone Encounter (Signed)
Patient called stating that she has seen on her Mychart her results and she seen that everything is okay, she would like to know when can she start using the vaginal cream. Please contact pt

## 2017-07-30 ENCOUNTER — Ambulatory Visit: Payer: Medicare HMO | Attending: Anesthesiology | Admitting: Physical Therapy

## 2017-07-30 DIAGNOSIS — G8929 Other chronic pain: Secondary | ICD-10-CM | POA: Insufficient documentation

## 2017-07-30 DIAGNOSIS — M545 Low back pain: Secondary | ICD-10-CM | POA: Diagnosis not present

## 2017-07-30 NOTE — Therapy (Signed)
[facility] [place], [place], [address] Phone: [phone]   Fax:  [phone]  Physical Therapy Evaluation  Patient Details  Name: Rebecca [name] MRN: [id number] Date of Birth: [birth year] Referring Provider: Rayvon Char. Francesco Runner   Encounter Date: 07/30/2017  PT End of Session - 07/30/17 2034    Visit Number  1    Number of Visits  8    Date for PT Re-Evaluation  08/27/17    Authorization Type  Progress note every 10th visit    PT Start Time  1350    PT Stop Time  1430    PT Time Calculation (min)  40 min    Activity Tolerance  Patient tolerated treatment well    Behavior During Therapy  [facility] for tasks assessed/performed       Past Medical History:  Diagnosis Date  . Allergy   . Anxiety   . Arthritis   . Chronic back pain   . Chronic kidney disease    cyst on bil kidneys  . Colitis, ischemic ([place])   . Depression   . Dysphagia   . Fibromyalgia   . GERD (gastroesophageal reflux disease)   . Hiatal hernia   . Hyperlipidemia   . Hypertension   . Myocardial infarction ([place])   . 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 Peripheral neuropathy   . Schatzki's ring   . Sciatica   . Sensorineural hearing loss   . Tinnitus     Past Surgical History:  Procedure Laterality Date  . ABDOMINAL HYSTERECTOMY    . BACK SURGERY    . BLADDER SURGERY    . BREAST SURGERY    . carple tunnal  surgery     Right hand  . NECK SURGERY     cyst on neck  . pillonidal cyst     resection    There were no vitals filed for this visit.   Subjective Assessment - 07/30/17 2031    Subjective  Patient arrives to physical therapy with reports of low back pain. Patient reported having an epidural injection performed on 07/26/17 to which low back pain has increased with an increase headaches and neck pain.  Patient stated she reports pain with ADLs, washing dishes, and with increased activity. Patient reported left sided  tingling but denies numbness. Patient reports pain can reach a 10/10 with increased movement; pain at best is 3/10. Patient reports being fairly active but is unable to gauge when to stop activity before pain sets in. Patient's goals are to decrease pain, improve movement to return to PLOF and return to walking.     Pertinent History  HTN, osteopenia    Limitations  Standing;Walking;House hold activities    Currently in Pain?  Yes    Pain Score  6     Pain Location  Back    Pain Orientation  Lower    Pain Descriptors / Indicators  Sore;Aching    Pain Type  Chronic pain    Pain Onset  More than a month ago    Pain Frequency  Constant    Aggravating Factors   moving    Pain Relieving Factors  lying down         [facility] PT Assessment - 07/30/17 0001      Assessment   Medical Diagnosis  Spondylosis of lumbar spine, Lumbar DDD    Referring Provider  Rayvon Char. O'Toole    Onset Date/Surgical Date  -- ongoing  Prior Therapy  yes      Precautions   Precautions  None      Restrictions   Weight Bearing Restrictions  No      Balance Screen   Has the patient fallen in the past 6 months  Yes    How many times?  1    Has the patient had a decrease in activity level because of a fear of falling?   Yes    Is the patient reluctant to leave their home because of a fear of falling?   Yes      Home Environment   Living Environment  Private residence      ROM / Strength   AROM / PROM / Strength  AROM;Strength      AROM   AROM Assessment Site  Lumbar    Lumbar Flexion  3 inches finger tip to floor    Lumbar Extension  Limited by 50%    Lumbar - Right Side Bend  22.5 inches finger tip to floor    Lumbar - Left Side Bend  22.5 inches finger tip to floor      Strength   Overall Strength  Within functional limits for tasks performed    Strength Assessment Site  Hip;Knee    Right/Left Hip  Right;Left    Right Hip Flexion  4-/5    Right Hip Extension  3+/5    Right Hip ABduction  4/5     Left Hip Flexion  4/5    Left Hip Extension  3+/5 (+) hip pain    Left Hip ABduction  4/5    Right/Left Knee  Right;Left    Right Knee Flexion  5/5    Right Knee Extension  4/5    Left Knee Flexion  5/5    Left Knee Extension  4/5      Flexibility   Soft Tissue Assessment /Muscle Length  yes    Hamstrings  WFL      Palpation   Palpation comment  tender to palpation along L2-L5 and bilateral glutes      Special Tests    Special Tests  Sacrolliac Tests    Sacroiliac Tests   Sacral Thrust      Sacral thrust    Findings  Negative      Ambulation/Gait   Gait Pattern  Within Functional Limits                Objective measurements completed on examination: See above findings.                   PT Long Term Goals - 07/30/17 2048      PT LONG TERM GOAL #1   Title  Patient will be independent with HEP    Time  4    Period  Weeks    Status  New      PT LONG TERM GOAL #2   Title  Patient will improve LE strength to 4+/5 bilaterally to improve stability during functional activities.     Time  4    Period  Weeks    Status  New      PT LONG TERM GOAL #3   Title  Patient will be able to perform functional activities with less than 3/10 pain.     Time  4    Period  Weeks    Status  New             Plan - 07/30/17  2035    Clinical Impression Statement  Patient is a 67 year old female who presents to physical therapy with reports of low back pain. Patient noted with decreased strength in bilateral hip extensors. Patient noted with decreased lumbar AROM with pain. Patient tender to palpation along bilateral glutes. (-) for sacral thrust test. Patient noted with normal sensation. Due to reports of headache and neck pain, patient instructed to follow up with MD who performed epidural to address new symptoms. Patient reported understanding. Secondary to high copay and financial situation, patient would be attending 1-2x per week with emphasis on HEP.  Patient would benefit from skilled physical therapy to address deficits and address patient's goals.     Clinical Presentation  Evolving    Clinical Decision Making  Low    Rehab Potential  Good    PT Frequency  2x / week    PT Duration  4 weeks    PT Treatment/Interventions  ADLs/Self Care Home Management;Electrical Stimulation;Traction;Moist Heat;Iontophoresis 57m/ml Dexamethasone;Cryotherapy;Ultrasound;Therapeutic exercise;Therapeutic activities;Functional mobility training;Stair training;Gait training;Balance training;Neuromuscular re-education;Patient/family education;Manual techniques;Passive range of motion;Dry needling;Taping    PT Next Visit Plan  Nustep, core stabilization, modalities PRN for pain relief.    PT Home Exercise Plan  draw in, marching    Consulted and Agree with Plan of Care  Patient       Patient will benefit from skilled therapeutic intervention in order to improve the following deficits and impairments:  Pain, Decreased activity tolerance, Decreased endurance, Decreased range of motion, Decreased strength, Decreased balance  Visit Diagnosis: Chronic low back pain, unspecified back pain laterality, with sciatica presence unspecified     Problem List Patient Active Problem List   Diagnosis Date Noted  . 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 BACK STRAIN 03/17/2010  . HEMATURIA UNSPECIFIED 02/24/2010  . Dyspareunia 02/24/2010  . OTHER SPEC HYPERTROPHIC&ATROPHIC CONDITION SKIN 02/24/2010  . DYSURIA 11/20/2008  . HYPERLIPIDEMIA 10/30/2008  . ALLERGIC RHINITIS 12/26/2007  . HEADACHE 11/27/2007  . RESTLESS LEG SYNDROME 10/03/2007  . CONSTIPATION 10/03/2007  . [place] FEMALE 10/03/2007  . 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 ANXIETY DEPRESSION 08/01/2007  . TOBACCO ABUSE 08/01/2007  . ISCHEMIC COLITIS 08/01/2007  . DCerro GordoDISEASE, LUMBOSACRAL SPINE 08/01/2007   KGabriela Eves  PT, DPT 07/30/17 8:52 PM   [facility] [address] [address] [place] [address]Phone: [phone]  Fax:  [phone] Name: Rebecca HITTLEMRN: [id number] of Birth: 113-May-1952

## 2017-08-06 ENCOUNTER — Ambulatory Visit: Payer: Medicare HMO | Admitting: Physical Therapy

## 2017-08-06 DIAGNOSIS — M545 Low back pain: Principal | ICD-10-CM

## 2017-08-06 DIAGNOSIS — G8929 Other chronic pain: Secondary | ICD-10-CM

## 2017-08-06 NOTE — Therapy (Signed)
[facility] [place], [place], [address] Phone: [phone]   Fax:  [phone]  Physical Therapy Treatment  Patient Details  Name: Rebecca [name] MRN: [id number] Date of Birth: 1951/02/07 Referring Provider: Rayvon Char. Francesco Runner   Encounter Date: 08/06/2017  PT End of Session - 08/06/17 1440    Visit Number  2    Number of Visits  8    Date for PT Re-Evaluation  08/27/17    Authorization Type  Progress note every 10th visit    PT Start Time  1430    Activity Tolerance  Patient tolerated treatment well    Behavior During Therapy  [facility] for tasks assessed/performed       Past Medical History:  Diagnosis Date  . Allergy   . Anxiety   . Arthritis   . Chronic back pain   . Chronic kidney disease    cyst on bil kidneys  . Colitis, ischemic ([place])   . Depression   . Dysphagia   . Fibromyalgia   . GERD (gastroesophageal reflux disease)   . Hiatal hernia   . Hyperlipidemia   . Hypertension   . Myocardial infarction ([place])   . 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 Peripheral neuropathy   . Schatzki's ring   . Sciatica   . Sensorineural hearing loss   . Tinnitus     Past Surgical History:  Procedure Laterality Date  . ABDOMINAL HYSTERECTOMY    . BACK SURGERY    . BLADDER SURGERY    . BREAST SURGERY    . carple tunnal  surgery     Right hand  . NECK SURGERY     cyst on neck  . pillonidal cyst     resection    There were no vitals filed for this visit.  Subjective Assessment - 08/06/17 1441    Subjective  Patient reported pain in right low back is 3/10. Patient has been performing HEP and reported some soreness but noted with ability to walk a little more.    Pertinent History  HTN, osteopenia    Limitations  Standing;Walking;House hold activities    Currently in Pain?  Yes    Pain Score  3     Pain Location  Back    Pain Orientation  Lower    Pain Type  Chronic pain    Pain Onset  More  than a month ago         [facility] PT Assessment - 08/06/17 0001      Assessment   Medical Diagnosis  Spondylosis of lumbar spine, Lumbar DDD    Prior Therapy  yes      Precautions   Precautions  None                   [facility] Adult PT Treatment/Exercise - 08/06/17 0001      Exercises   Exercises  Lumbar      Lumbar Exercises: Stretches   Double Knee to Chest Stretch  3 reps;20 seconds    Lower Trunk Rotation  3 reps 15 second hold L only      Lumbar Exercises: Aerobic   Nustep  Level 3 x12 minutes      Lumbar Exercises: Supine   Clam  20 reps;2 seconds    Clam Limitations  red theraband    Bridge  Compliant;20 reps    Other Supine Lumbar Exercises  ball squeeze 5 second hold x20  Modalities   Modalities  Electrical Stimulation;Moist Heat      Moist Heat Therapy   Number Minutes Moist Heat  15 Minutes      Electrical Stimulation   Electrical Stimulation Location  R Low back    Electrical Stimulation Action  Pre-mod    Electrical Stimulation Parameters  80-150 hz x15 min    Electrical Stimulation Goals  Pain                  PT Long Term Goals - 07/30/17 2048      PT LONG TERM GOAL #1   Title  Patient will be independent with HEP    Time  4    Period  Weeks    Status  New      PT LONG TERM GOAL #2   Title  Patient will improve LE strength to 4+/5 bilaterally to improve stability during functional activities.     Time  4    Period  Weeks    Status  New      PT LONG TERM GOAL #3   Title  Patient will be able to perform functional activities with less than 3/10 pain.     Time  4    Period  Weeks    Status  New            Plan - 08/06/17 1618    Clinical Impression Statement  Patien was able to complete exercises with no reports of pain just muscle fatigue. Patient noted with ability to perform exercises with good form with no cuing required after demonstration. Patient instucted to continue HEP, add 5 mins of walking a day,  and lower trunk rotation to left to stretch right QL. Patient educated importance of not doing too much to save energy and reduce muscle fatigue early in the day. Patient reported agreement. No adverse effects noted upon removal of E-stim.    Clinical Presentation  Evolving    Clinical Decision Making  Low    Rehab Potential  Good    PT Frequency  2x / week    PT Duration  4 weeks    PT Treatment/Interventions  ADLs/Self Care Home Management;Electrical Stimulation;Traction;Moist Heat;Iontophoresis 92m/ml Dexamethasone;Cryotherapy;Ultrasound;Therapeutic exercise;Therapeutic activities;Functional mobility training;Stair training;Gait training;Balance training;Neuromuscular re-education;Patient/family education;Manual techniques;Passive range of motion;Dry needling;Taping    PT Next Visit Plan  Nustep, core stabilization, modalities PRN for pain relief.    PT Home Exercise Plan  draw in, marching, lower trunk rotation    Consulted and Agree with Plan of Care  Patient       Patient will benefit from skilled therapeutic intervention in order to improve the following deficits and impairments:  Pain, Decreased activity tolerance, Decreased endurance, Decreased range of motion, Decreased strength, Decreased balance  Visit Diagnosis: Chronic low back pain, unspecified back pain laterality, with sciatica presence unspecified     Problem List Patient Active Problem List   Diagnosis Date Noted  . 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 BACK STRAIN 03/17/2010  . HEMATURIA UNSPECIFIED 02/24/2010  . Dyspareunia 02/24/2010  . OTHER SPEC HYPERTROPHIC&ATROPHIC CONDITION SKIN 02/24/2010  . DYSURIA 11/20/2008  . HYPERLIPIDEMIA 10/30/2008  . ALLERGIC RHINITIS 12/26/2007  . HEADACHE 11/27/2007  . RESTLESS LEG SYNDROME 10/03/2007  . CONSTIPATION 10/03/2007  . [place] FEMALE 10/03/2007  . 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  ANXIETY DEPRESSION 08/01/2007  . TOBACCO ABUSE 08/01/2007  . ISCHEMIC COLITIS 08/01/2007  . D[place]psonDISEASE, LUMBOSACRAL SPINE 08/01/2007  Gabriela Eves, PT, DPT 08/06/2017, 4:29 PM  [facility] [address] [place], [place], [address] Phone: [phone]   Fax:  [phone]  Name: LOUCILLE TAKACH MRN: [id number] Date of Birth: 04-25-1950

## 2017-08-14 ENCOUNTER — Ambulatory Visit: Payer: Medicare HMO | Attending: Anesthesiology | Admitting: *Deleted

## 2017-08-14 DIAGNOSIS — G8929 Other chronic pain: Secondary | ICD-10-CM | POA: Insufficient documentation

## 2017-08-14 DIAGNOSIS — M545 Low back pain: Secondary | ICD-10-CM | POA: Insufficient documentation

## 2017-08-14 NOTE — Therapy (Signed)
[facility] [place], [place], [address] Phone: [phone]   Fax:  [phone]  Physical Therapy Treatment  Patient Details  Name: Rebecca [name] MRN: [id number] Date of Birth: Jun 26, 1950 Referring Provider: Rayvon Char. Francesco Runner   Encounter Date: 08/14/2017  PT End of Session - 08/14/17 1527    Visit Number  3    Number of Visits  8    Date for PT Re-Evaluation  08/27/17    Authorization Type  Progress note every 10th visit    PT Start Time  1515    PT Stop Time  1603    PT Time Calculation (min)  48 min       Past Medical History:  Diagnosis Date  . Allergy   . Anxiety   . Arthritis   . Chronic back pain   . Chronic kidney disease    cyst on bil kidneys  . Colitis, ischemic ([place])   . Depression   . Dysphagia   . Fibromyalgia   . GERD (gastroesophageal reflux disease)   . Hiatal hernia   . Hyperlipidemia   . Hypertension   . Myocardial infarction ([place])   . 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 Peripheral neuropathy   . Schatzki's ring   . Sciatica   . Sensorineural hearing loss   . Tinnitus     Past Surgical History:  Procedure Laterality Date  . ABDOMINAL HYSTERECTOMY    . BACK SURGERY    . BLADDER SURGERY    . BREAST SURGERY    . carple tunnal  surgery     Right hand  . NECK SURGERY     cyst on neck  . pillonidal cyst     resection    There were no vitals filed for this visit.  Subjective Assessment - 08/14/17 1528    Subjective  Doing better overall. LBP is less today    Pertinent History  HTN, osteopenia    Limitations  Standing;Walking;House hold activities    Currently in Pain?  Yes    Pain Score  4     Pain Location  Back    Pain Orientation  Lower    Pain Descriptors / Indicators  Sore    Pain Type  Chronic pain    Pain Onset  More than a month ago    Pain Frequency  Constant                       [facility] Adult PT Treatment/Exercise - 08/14/17  0001      Exercises   Exercises  Lumbar      Lumbar Exercises: Stretches   Double Knee to Chest Stretch  3 reps;20 seconds      Lumbar Exercises: Aerobic   Nustep  Level 3 x15 minutes      Lumbar Exercises: Supine   Clam  20 reps;2 seconds    Clam Limitations  red theraband    Bridge  20 reps    Other Supine Lumbar Exercises  ball squeeze 5 second hold x20   some RT sided back pain      Modalities   Modalities  Electrical Stimulation;Moist Heat      Moist Heat Therapy   Number Minutes Moist Heat  15 Minutes    Moist Heat Location  Lumbar Spine      Electrical Stimulation   Electrical Stimulation Location  R Low back Premod 80-150hz     Electrical  Stimulation Goals  Pain                  PT Long Term Goals - 07/30/17 2048      PT LONG TERM GOAL #1   Title  Patient will be independent with HEP    Time  4    Period  Weeks    Status  New      PT LONG TERM GOAL #2   Title  Patient will improve LE strength to 4+/5 bilaterally to improve stability during functional activities.     Time  4    Period  Weeks    Status  New      PT LONG TERM GOAL #3   Title  Patient will be able to perform functional activities with less than 3/10 pain.     Time  4    Period  Weeks    Status  New            Plan - 08/14/17 1525    Clinical Impression Statement  Pt arrived today doing better with decreased LBP and was well able to perform stretching and core strengthening with only 1 ex that increased RT side LBP (supine adductor squeeze.).  Normal modality response with decreased pain after session.    Rehab Potential  Good    PT Frequency  2x / week    PT Duration  4 weeks    PT Treatment/Interventions  ADLs/Self Care Home Management;Electrical Stimulation;Traction;Moist Heat;Iontophoresis 23m/ml Dexamethasone;Cryotherapy;Ultrasound;Therapeutic exercise;Therapeutic activities;Functional mobility training;Stair training;Gait training;Balance training;Neuromuscular  re-education;Patient/family education;Manual techniques;Passive range of motion;Dry needling;Taping    PT Next Visit Plan  Nustep, core stabilization, modalities PRN for pain relief.       Patient will benefit from skilled therapeutic intervention in order to improve the following deficits and impairments:  Pain, Decreased activity tolerance, Decreased endurance, Decreased range of motion, Decreased strength, Decreased balance  Visit Diagnosis: Chronic low back pain, unspecified back pain laterality, with sciatica presence unspecified     Problem List Patient Active Problem List   Diagnosis Date Noted  . 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 BACK STRAIN 03/17/2010  . HEMATURIA UNSPECIFIED 02/24/2010  . Dyspareunia 02/24/2010  . OTHER SPEC HYPERTROPHIC&ATROPHIC CONDITION SKIN 02/24/2010  . DYSURIA 11/20/2008  . HYPERLIPIDEMIA 10/30/2008  . ALLERGIC RHINITIS 12/26/2007  . HEADACHE 11/27/2007  . RESTLESS LEG SYNDROME 10/03/2007  . CONSTIPATION 10/03/2007  . [place] FEMALE 10/03/2007  . 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 ANXIETY DEPRESSION 08/01/2007  . TOBACCO ABUSE 08/01/2007  . ISCHEMIC COLITIS 08/01/2007  . DTamahaDISEASE, LUMBOSACRAL SPINE 08/01/2007    Kemaria Dedic,CHRIS, PTA 08/14/2017, 6:09 PM  [facility] [address]Phone: [phone]  Fax:  [phone] Name: KSHARINE CADLEMRN: [id number] of Birth: 1May 27, 1952

## 2017-08-15 ENCOUNTER — Ambulatory Visit (HOSPITAL_COMMUNITY): Payer: Medicare HMO | Admitting: Psychiatry

## 2017-08-15 ENCOUNTER — Encounter (HOSPITAL_COMMUNITY): Payer: Self-pay | Admitting: Psychiatry

## 2017-08-15 VITALS — BP 132/77 | HR 89 | Ht 65.0 in | Wt 145.0 lb

## 2017-08-15 DIAGNOSIS — F17210 Nicotine dependence, cigarettes, uncomplicated: Secondary | ICD-10-CM

## 2017-08-15 DIAGNOSIS — Z811 Family history of alcohol abuse and dependence: Secondary | ICD-10-CM

## 2017-08-15 DIAGNOSIS — Z62810 Personal history of physical and sexual abuse in childhood: Secondary | ICD-10-CM

## 2017-08-15 DIAGNOSIS — M549 Dorsalgia, unspecified: Secondary | ICD-10-CM | POA: Diagnosis not present

## 2017-08-15 DIAGNOSIS — F331 Major depressive disorder, recurrent, moderate: Secondary | ICD-10-CM | POA: Diagnosis not present

## 2017-08-15 DIAGNOSIS — Z818 Family history of other mental and behavioral disorders: Secondary | ICD-10-CM

## 2017-08-15 DIAGNOSIS — M2550 Pain in unspecified joint: Secondary | ICD-10-CM

## 2017-08-15 MED ORDER — ALPRAZOLAM 0.5 MG PO TABS
0.5000 mg | ORAL_TABLET | Freq: Four times a day (QID) | ORAL | 2 refills | Status: DC
Start: 2017-08-15 — End: 2017-11-15

## 2017-08-15 MED ORDER — SERTRALINE HCL 50 MG PO TABS: 50.0000 mg | ORAL_TABLET | Freq: Two times a day (BID) | ORAL | 2 refills | Status: DC
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
## 2017-08-21 ENCOUNTER — Encounter: Payer: Self-pay | Admitting: *Deleted

## 2017-08-28 ENCOUNTER — Ambulatory Visit: Payer: Medicare HMO | Admitting: Physical Therapy

## 2017-08-28 DIAGNOSIS — M545 Low back pain: Secondary | ICD-10-CM | POA: Diagnosis not present

## 2017-08-28 DIAGNOSIS — G8929 Other chronic pain: Secondary | ICD-10-CM

## 2017-08-28 NOTE — Therapy (Signed)
[name] [place], [place], [address] Phone: [phone]   Fax:  [phone]  Physical Therapy Treatment  Patient Details  Name: Rebecca [name] MRN: [id number] Date of Birth: July 06, 1950 Referring Provider: Rayvon Char. Francesco Runner   Encounter Date: 08/28/2017  PT End of Session - 08/28/17 1446    Visit Number  4    Number of Visits  8    Date for PT Re-Evaluation  08/27/17    Authorization Type  Progress note every 10th visit    PT Start Time  1438    PT Stop Time  1523    PT Time Calculation (min)  45 min    Activity Tolerance  Patient tolerated treatment well    Behavior During Therapy  [facility] for tasks assessed/performed       Past Medical History:  Diagnosis Date  . Allergy   . Anxiety   . Arthritis   . Chronic back pain   . Chronic kidney disease    cyst on bil kidneys  . Colitis, ischemic ([place])   . Depression   . Dysphagia   . Fibromyalgia   . GERD (gastroesophageal reflux disease)   . Hiatal hernia   . Hyperlipidemia   . Hypertension   . Myocardial infarction ([place])   . 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 Peripheral neuropathy   . Schatzki's ring   . Sciatica   . Sensorineural hearing loss   . Tinnitus     Past Surgical History:  Procedure Laterality Date  . ABDOMINAL HYSTERECTOMY    . BACK SURGERY    . BLADDER SURGERY    . BREAST SURGERY    . carple tunnal  surgery     Right hand  . NECK SURGERY     cyst on neck  . pillonidal cyst     resection    There were no vitals filed for this visit.      [facility] PT Assessment - 08/28/17 0001      Assessment   Medical Diagnosis  Spondylosis of lumbar spine, Lumbar DDD    Prior Therapy  yes      Precautions   Precautions  None                   [facility] Adult PT Treatment/Exercise - 08/28/17 0001      Exercises   Exercises  Lumbar      Lumbar Exercises: Stretches   Double Knee to Chest Stretch  2 reps;30  seconds      Lumbar Exercises: Aerobic   Stationary Bike  Level 3 x15 minutes      Lumbar Exercises: Supine   Clam  20 reps;2 seconds    Clam Limitations  red theraband    Bent Knee Raise  10 reps    Bridge with [employer]  20 reps      Moist Heat Therapy   Number Minutes Moist Heat  10 Minutes    Moist Heat Location  Lumbar Spine      Electrical Stimulation   Electrical Stimulation Location  right low back    Electrical Stimulation Action  Pre-mod    Electrical Stimulation Parameters  80-150 hz x10    Electrical Stimulation Goals  Pain      Manual Therapy   Manual Therapy  Soft tissue mobilization    Soft tissue mobilization  STW/M to R lumbar paraspinals and QL decrease R low back pain  PT Long Term Goals - 07/30/17 2048      PT LONG TERM GOAL #1   Title  Patient will be independent with HEP    Time  4    Period  Weeks    Status  New      PT LONG TERM GOAL #2   Title  Patient will improve LE strength to 4+/5 bilaterally to improve stability during functional activities.     Time  4    Period  Weeks    Status  New      PT LONG TERM GOAL #3   Title  Patient will be able to perform functional activities with less than 3/10 pain.     Time  4    Period  Weeks    Status  New            Plan - 08/28/17 1526    Clinical Impression Statement  Patient was able to tolerate treatment well but required multiple cuing and demonstration for proper form and technique. Patient noted with increased "pulling" in right glutes during bridging exeercises. Normal response to modalities upon removal.    Clinical Presentation  Evolving    Clinical Decision Making  Low    Rehab Potential  Good    PT Frequency  2x / week    PT Duration  4 weeks    PT Treatment/Interventions  ADLs/Self Care Home Management;Electrical Stimulation;Traction;Moist Heat;Iontophoresis 28m/ml Dexamethasone;Cryotherapy;Ultrasound;Therapeutic exercise;Therapeutic  activities;Functional mobility training;Stair training;Gait training;Balance training;Neuromuscular re-education;Patient/family education;Manual techniques;Passive range of motion;Dry needling;Taping    PT Next Visit Plan  Nustep, core stabilization, modalities PRN for pain relief.    Consulted and Agree with Plan of Care  Patient       Patient will benefit from skilled therapeutic intervention in order to improve the following deficits and impairments:  Pain, Decreased activity tolerance, Decreased endurance, Decreased range of motion, Decreased strength, Decreased balance  Visit Diagnosis: Chronic low back pain, unspecified back pain laterality, with sciatica presence unspecified     Problem List Patient Active Problem List   Diagnosis Date Noted  . 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 BACK STRAIN 03/17/2010  . HEMATURIA UNSPECIFIED 02/24/2010  . Dyspareunia 02/24/2010  . OTHER SPEC HYPERTROPHIC&ATROPHIC CONDITION SKIN 02/24/2010  . DYSURIA 11/20/2008  . HYPERLIPIDEMIA 10/30/2008  . ALLERGIC RHINITIS 12/26/2007  . HEADACHE 11/27/2007  . RESTLESS LEG SYNDROME 10/03/2007  . CONSTIPATION 10/03/2007  . [place] FEMALE 10/03/2007  . 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 ANXIETY DEPRESSION 08/01/2007  . TOBACCO ABUSE 08/01/2007  . ISCHEMIC COLITIS 08/01/2007  . [place]EASE, LUMBOSACRAL SPINE 08/01/2007    Rebecca [name] PT, DPT 08/28/2017, 4:56 PM  [name] [address] [address]Phone: [phone]  Fax:  [phone] Name: Rebecca YAMMRN: [id number] of Birth: 110/13/1952

## 2017-09-04 ENCOUNTER — Ambulatory Visit: Payer: Medicare HMO | Admitting: Physical Therapy

## 2017-09-04 DIAGNOSIS — M545 Low back pain: Secondary | ICD-10-CM | POA: Diagnosis not present

## 2017-09-04 DIAGNOSIS — G8929 Other chronic pain: Secondary | ICD-10-CM

## 2017-09-04 NOTE — Therapy (Signed)
[facility] [place], [place], [address] Phone: [phone]   Fax:  [phone]  Physical Therapy Treatment  Patient Details  Name: Rebecca [name] MRN: [id number] Date of Birth: 1950/05/02 Referring Provider: Rayvon Char. Francesco Runner   Encounter Date: 09/04/2017  PT End of Session - 09/04/17 1524    Visit Number  5    Number of Visits  8    Date for PT Re-Evaluation  10/01/17    Authorization Type  Progress note every 10th visit    PT Start Time  1520    PT Stop Time  1610    PT Time Calculation (min)  50 min    Activity Tolerance  Patient tolerated treatment well    Behavior During Therapy  [facility] for tasks assessed/performed       Past Medical History:  Diagnosis Date  . Allergy   . Anxiety   . Arthritis   . Chronic back pain   . Chronic kidney disease    cyst on bil kidneys  . Colitis, ischemic ([place])   . Depression   . Dysphagia   . Fibromyalgia   . GERD (gastroesophageal reflux disease)   . Hiatal hernia   . Hyperlipidemia   . Hypertension   . Myocardial infarction ([facility])   . 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 Peripheral neuropathy   . Schatzki's ring   . Sciatica   . Sensorineural hearing loss   . Tinnitus     Past Surgical History:  Procedure Laterality Date  . ABDOMINAL HYSTERECTOMY    . BACK SURGERY    . BLADDER SURGERY    . BREAST SURGERY    . carple tunnal  surgery     Right hand  . NECK SURGERY     cyst on neck  . pillonidal cyst     resection    There were no vitals filed for this visit.  Subjective Assessment - 09/04/17 1527    Subjective  Patient reported doing "alright."    Pertinent History  HTN, osteopenia    Limitations  Standing;Walking;House hold activities    Currently in Pain?  Yes    Pain Score  3     Pain Location  Back    Pain Orientation  Lower    Pain Descriptors / Indicators  Sore    Pain Type  Chronic pain    Pain Onset  More than a month ago    Pain Frequency  Constant         [facility] PT Assessment - 09/04/17 0001      Assessment   Medical Diagnosis  Spondylosis of lumbar spine, Lumbar DDD    Prior Therapy  yes      Precautions   Precautions  None                   [facility] Adult PT Treatment/Exercise - 09/04/17 0001      Exercises   Exercises  Lumbar      Lumbar Exercises: Stretches   Double Knee to Chest Stretch  2 reps;30 seconds    Lower Trunk Rotation  2 reps;30 seconds      Lumbar Exercises: Aerobic   Nustep  Level 4 x15 minutes      Lumbar Exercises: Standing   Row  Strengthening;Both;20 reps;10 reps x30    Row Limitations  pink XTS    Shoulder Extension  Strengthening;Both;10 reps;20 reps x30    Shoulder Extension Limitations  Pink XTS      Lumbar Exercises: Supine   Bridge  20 reps      Lumbar Exercises: Sidelying   Clam  Both;20 reps    Clam Limitations  red theraband      Modalities   Modalities  Electrical Stimulation;Moist Heat      Moist Heat Therapy   Number Minutes Moist Heat  10 Minutes    Moist Heat Location  Lumbar Spine      Electrical Stimulation   Electrical Stimulation Location  right low back    Electrical Stimulation Action  pre-mod    Electrical Stimulation Parameters  80-150 hz x10 min    Electrical Stimulation Goals  Pain                  PT Long Term Goals - 07/30/17 2048      PT LONG TERM GOAL #1   Title  Patient will be independent with HEP    Time  4    Period  Weeks    Status  New      PT LONG TERM GOAL #2   Title  Patient will improve LE strength to 4+/5 bilaterally to improve stability during functional activities.     Time  4    Period  Weeks    Status  New      PT LONG TERM GOAL #3   Title  Patient will be able to perform functional activities with less than 3/10 pain.     Time  4    Period  Weeks    Status  New            Plan - 09/04/17 1544    Clinical Impression Statement  Patient was able to complete treatment with  no reports of increased pain or discomfort. Patient noted an overall improvement in low back pain and is able to do much more however patient still states she is still unable to gauge when to stop activity to prevent pain. Normal response to modalities upon removal.    Clinical Presentation  Evolving    Clinical Decision Making  Low    Rehab Potential  Good    PT Frequency  2x / week    PT Duration  4 weeks    PT Treatment/Interventions  ADLs/Self Care Home Management;Electrical Stimulation;Traction;Moist Heat;Iontophoresis 10m/ml Dexamethasone;Cryotherapy;Ultrasound;Therapeutic exercise;Therapeutic activities;Functional mobility training;Stair training;Gait training;Balance training;Neuromuscular re-education;Patient/family education;Manual techniques;Passive range of motion;Dry needling;Taping    PT Next Visit Plan  Nustep, core stabilization, modalities PRN for pain relief.    Consulted and Agree with Plan of Care  Patient       Patient will benefit from skilled therapeutic intervention in order to improve the following deficits and impairments:  Pain, Decreased activity tolerance, Decreased endurance, Decreased range of motion, Decreased strength, Decreased balance  Visit Diagnosis: Chronic low back pain, unspecified back pain laterality, with sciatica presence unspecified     Problem List Patient Active Problem List   Diagnosis Date Noted  . 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 BACK STRAIN 03/17/2010  . HEMATURIA UNSPECIFIED 02/24/2010  . Dyspareunia 02/24/2010  . OTHER SPEC HYPERTROPHIC&ATROPHIC CONDITION SKIN 02/24/2010  . DYSURIA 11/20/2008  . HYPERLIPIDEMIA 10/30/2008  . ALLERGIC RHINITIS 12/26/2007  . HEADACHE 11/27/2007  . RESTLESS LEG SYNDROME 10/03/2007  . CONSTIPATION 10/03/2007  . [place] FEMALE 10/03/2007  . 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 ANXIETY DEPRESSION 08/01/2007  .  TOBACCO ABUSE 08/01/2007  . ISCHEMIC  COLITIS 08/01/2007  . [place] DISEASE, LUMBOSACRAL SPINE 08/01/2007   Gabriela Eves, PT, DPT 09/04/2017, 6:04 PM  [facility] [address] [place], [place], [address] Phone: [phone]   Fax:  [phone]  Name: ADRIANAH PROPHETE MRN: [id number] Date of Birth: 1951-03-20

## 2017-09-11 ENCOUNTER — Ambulatory Visit: Payer: Medicare HMO | Attending: Anesthesiology | Admitting: Physical Therapy

## 2017-09-11 DIAGNOSIS — M545 Low back pain: Secondary | ICD-10-CM | POA: Diagnosis not present

## 2017-09-11 DIAGNOSIS — G8929 Other chronic pain: Secondary | ICD-10-CM | POA: Diagnosis present

## 2017-09-11 NOTE — Therapy (Signed)
[facility] [place], [place], [address] Phone: [phone]   Fax:  [phone]  Physical Therapy Treatment  Patient Details  Name: Rebecca [name] MRN: [id number] Date of Birth: [birth year] Referring Provider: Rayvon Char. Francesco Runner   Encounter Date: 09/11/2017  PT End of Session - 09/11/17 1530    Visit Number  6    Number of Visits  8    Date for PT Re-Evaluation  10/01/17    Authorization Type  Progress note every 10th visit    PT Start Time  1516    PT Stop Time  1610    PT Time Calculation (min)  54 min    Activity Tolerance  Patient tolerated treatment well    Behavior During Therapy  [facility] for tasks assessed/performed       Past Medical History:  Diagnosis Date  . Allergy   . Anxiety   . Arthritis   . Chronic back pain   . Chronic kidney disease    cyst on bil kidneys  . Colitis, ischemic ([place])   . Depression   . Dysphagia   . Fibromyalgia   . GERD (gastroesophageal reflux disease)   . Hiatal hernia   . Hyperlipidemia   . Hypertension   . Myocardial infarction ([place])   . 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 Peripheral neuropathy   . Schatzki's ring   . Sciatica   . Sensorineural hearing loss   . Tinnitus     Past Surgical History:  Procedure Laterality Date  . ABDOMINAL HYSTERECTOMY    . BACK SURGERY    . BLADDER SURGERY    . BREAST SURGERY    . carple tunnal  surgery     Right hand  . NECK SURGERY     cyst on neck  . pillonidal cyst     resection    There were no vitals filed for this visit.  Subjective Assessment - 09/11/17 1523    Subjective  "My back feels good but I have some pain in my thigh."    Pertinent History  HTN, osteopenia    Limitations  Standing;Walking;House hold activities    Currently in Pain?  No/denies         [facility] PT Assessment - 09/11/17 0001      Assessment   Medical Diagnosis  Spondylosis of lumbar spine, Lumbar DDD    Prior Therapy  yes                    [facility] Adult PT Treatment/Exercise - 09/11/17 0001      Exercises   Exercises  Lumbar      Lumbar Exercises: Aerobic   Stationary Bike  Level 4 x15 minutes      Lumbar Exercises: Machines for Strengthening   Leg Press  1 plate 2x10 with emphasis on draw in      Lumbar Exercises: Standing   Row  Strengthening;Both;20 reps;10 reps    Row Limitations  Pink XTS    Shoulder Extension  Strengthening;Both;10 reps;20 reps    Shoulder Extension Limitations  Pink XTS      Lumbar Exercises: Supine   Bent Knee Raise  20 reps    Bridge  Compliant;10 reps;20 reps x30      Lumbar Exercises: Sidelying   Clam  Both;20 reps    Clam Limitations  red theraband      Modalities   Modalities  Electrical Stimulation;Moist Heat  Moist Heat Therapy   Number Minutes Moist Heat  10 Minutes    Moist Heat Location  Lumbar Spine      Electrical Stimulation   Electrical Stimulation Location  right low back    Electrical Stimulation Action  pre-mod    Electrical Stimulation Parameters  80-150 hz x10    Electrical Stimulation Goals  Pain                  PT Long Term Goals - 09/11/17 1733      PT LONG TERM GOAL #1   Title  Patient will be independent with HEP    Time  4    Period  Weeks    Status  Achieved      PT LONG TERM GOAL #2   Title  Patient will improve LE strength to 4+/5 bilaterally to improve stability during functional activities.     Time  4    Period  Weeks    Status  On-going      PT LONG TERM GOAL #3   Title  Patient will be able to perform functional activities with less than 3/10 pain.     Time  4    Period  Weeks    Status  On-going Still has pain up to 4/10 with ADLS and functional activitie            Plan - 09/11/17 1643    Clinical Impression Statement  Patient was able to complete treatment with minimal reports of pain. Patient reported "feeling it" with sidelying clam shells but did not report increase of pain.  Patient provided with red Tband to perform supine clam shells and rows/extensions at home. Patient reported understanding, No adverse affects noted upon removal of modalities.    Clinical Presentation  Evolving    Clinical Decision Making  Low    Rehab Potential  Good    PT Frequency  2x / week    PT Duration  4 weeks    PT Treatment/Interventions  ADLs/Self Care Home Management;Electrical Stimulation;Traction;Moist Heat;Iontophoresis 14m/ml Dexamethasone;Cryotherapy;Ultrasound;Therapeutic exercise;Therapeutic activities;Functional mobility training;Stair training;Gait training;Balance training;Neuromuscular re-education;Patient/family education;Manual techniques;Passive range of motion;Dry needling;Taping    PT Next Visit Plan  Nustep, core stabilization, modalities PRN for pain relief.    Consulted and Agree with Plan of Care  Patient       Patient will benefit from skilled therapeutic intervention in order to improve the following deficits and impairments:  Pain, Decreased activity tolerance, Decreased endurance, Decreased range of motion, Decreased strength, Decreased balance  Visit Diagnosis: Chronic low back pain, unspecified back pain laterality, with sciatica presence unspecified     Problem List Patient Active Problem List   Diagnosis Date Noted  . 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 BACK STRAIN 03/17/2010  . HEMATURIA UNSPECIFIED 02/24/2010  . Dyspareunia 02/24/2010  . OTHER SPEC HYPERTROPHIC&ATROPHIC CONDITION SKIN 02/24/2010  . DYSURIA 11/20/2008  . HYPERLIPIDEMIA 10/30/2008  . ALLERGIC RHINITIS 12/26/2007  . HEADACHE 11/27/2007  . RESTLESS LEG SYNDROME 10/03/2007  . CONSTIPATION 10/03/2007  . C[place] FEMALE 10/03/2007  . 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 ANXIETY DEPRESSION 08/01/2007  . TOBACCO ABUSE 08/01/2007  . ISCHEMIC COLITIS 08/01/2007  . D[place]eraDISEASE, LUMBOSACRAL SPINE  08/01/2007    KGabriela Eves PT, DPT 09/11/2017, 5:34 PM  [facility] [address] [address]Phone: [phone]  Fax:  [phone] Name: Rebecca OUTTENMRN: [id number] of  Birth: 03-Apr-1951

## 2017-09-18 ENCOUNTER — Encounter: Payer: Self-pay | Admitting: Physical Therapy

## 2017-09-18 ENCOUNTER — Ambulatory Visit: Payer: Medicare HMO | Admitting: Physical Therapy

## 2017-09-18 DIAGNOSIS — M545 Low back pain: Principal | ICD-10-CM

## 2017-09-18 DIAGNOSIS — G8929 Other chronic pain: Secondary | ICD-10-CM

## 2017-09-18 NOTE — Therapy (Signed)
[facility] [place], [place], [address] Phone: [phone]   Fax:  [phone]  Physical Therapy Treatment  Patient Details  Name: Rebecca [name] MRN: [id number] Date of Birth: [birth year] Referring Provider: Rayvon Char. Francesco Runner   Encounter Date: 09/18/2017  PT End of Session - 09/18/17 1541    Visit Number  7    Number of Visits  8    Date for PT Re-Evaluation  10/01/17    Authorization Type  Progress note every 10th visit    PT Start Time  1515    PT Stop Time  1610    PT Time Calculation (min)  55 min    Activity Tolerance  Patient tolerated treatment well    Behavior During Therapy  [facility] for tasks assessed/performed       Past Medical History:  Diagnosis Date  . Allergy   . Anxiety   . Arthritis   . Chronic back pain   . Chronic kidney disease    cyst on bil kidneys  . Colitis, ischemic ([place])   . Depression   . Dysphagia   . Fibromyalgia   . GERD (gastroesophageal reflux disease)   . Hiatal hernia   . Hyperlipidemia   . Hypertension   . Myocardial infarction ([place])   . 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 Peripheral neuropathy   . Schatzki's ring   . Sciatica   . Sensorineural hearing loss   . Tinnitus     Past Surgical History:  Procedure Laterality Date  . ABDOMINAL HYSTERECTOMY    . BACK SURGERY    . BLADDER SURGERY    . BREAST SURGERY    . carple tunnal  surgery     Right hand  . NECK SURGERY     cyst on neck  . pillonidal cyst     resection    There were no vitals filed for this visit.  Subjective Assessment - 09/18/17 1556    Subjective  Patient reported feeling okay.    Pertinent History  HTN, osteopenia    Limitations  Standing;Walking;House hold activities    Currently in Pain?  No/denies         [facility] PT Assessment - 09/18/17 0001      Assessment   Medical Diagnosis  Spondylosis of lumbar spine, Lumbar DDD    Prior Therapy  yes                    [facility] Adult PT Treatment/Exercise - 09/18/17 0001      Exercises   Exercises  Lumbar      Lumbar Exercises: Stretches   Lower Trunk Rotation  2 reps;30 seconds      Lumbar Exercises: Aerobic   Stationary Bike  Level 4 x15 minutes      Lumbar Exercises: Machines for Strengthening   Leg Press  1 plate 2x10 with emphasis on draw in      Lumbar Exercises: Supine   Bridge  Compliant;10 reps;20 reps x30    Other Supine Lumbar Exercises  supine clamshells x20      Modalities   Modalities  Electrical Stimulation;Moist Heat      Moist Heat Therapy   Number Minutes Moist Heat  10 Minutes    Moist Heat Location  Lumbar Spine      Electrical Stimulation   Electrical Stimulation Location  right low back    Electrical Stimulation Action  pre-mod    Electrical Stimulation  Parameters  80-150 hz x10    Electrical Stimulation Goals  Pain             PT Education - 09/18/17 1557    Education Details  rows, extension, clams shells    Person(s) Educated  Patient    Methods  Explanation;Demonstration    Comprehension  Verbalized understanding;Returned demonstration          PT Long Term Goals - 09/11/17 1733      PT LONG TERM GOAL #1   Title  Patient will be independent with HEP    Time  4    Period  Weeks    Status  Achieved      PT LONG TERM GOAL #2   Title  Patient will improve LE strength to 4+/5 bilaterally to improve stability during functional activities.     Time  4    Period  Weeks    Status  On-going      PT LONG TERM GOAL #3   Title  Patient will be able to perform functional activities with less than 3/10 pain.     Time  4    Period  Weeks    Status  On-going Still has pain up to 4/10 with ADLS and functional activitie            Plan - 09/18/17 1558    Clinical Impression Statement  Patient was able to tolerate treatment well with minimal reports of pain. Patient provided education to perform rows/extensions in doorway.  Patient reported understanding. Patient to be discharged next visit.    Clinical Presentation  Evolving    Clinical Decision Making  Low    Rehab Potential  Good    PT Frequency  2x / week    PT Duration  4 weeks    PT Treatment/Interventions  ADLs/Self Care Home Management;Electrical Stimulation;Traction;Moist Heat;Iontophoresis 34m/ml Dexamethasone;Cryotherapy;Ultrasound;Therapeutic exercise;Therapeutic activities;Functional mobility training;Stair training;Gait training;Balance training;Neuromuscular re-education;Patient/family education;Manual techniques;Passive range of motion;Dry needling;Taping    PT Next Visit Plan  Nustep, core stabilization, modalities PRN for pain relief.    Consulted and Agree with Plan of Care  Patient       Patient will benefit from skilled therapeutic intervention in order to improve the following deficits and impairments:  Pain, Decreased activity tolerance, Decreased endurance, Decreased range of motion, Decreased strength, Decreased balance  Visit Diagnosis: Chronic low back pain, unspecified back pain laterality, with sciatica presence unspecified     Problem List Patient Active Problem List   Diagnosis Date Noted  . 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 BACK STRAIN 03/17/2010  . HEMATURIA UNSPECIFIED 02/24/2010  . Dyspareunia 02/24/2010  . OTHER SPEC HYPERTROPHIC&ATROPHIC CONDITION SKIN 02/24/2010  . DYSURIA 11/20/2008  . HYPERLIPIDEMIA 10/30/2008  . ALLERGIC RHINITIS 12/26/2007  . HEADACHE 11/27/2007  . RESTLESS LEG SYNDROME 10/03/2007  . CONSTIPATION 10/03/2007  . CY[place] FEMALE 10/03/2007  . 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 ANXIETY DEPRESSION 08/01/2007  . TOBACCO ABUSE 08/01/2007  . ISCHEMIC COLITIS 08/01/2007  . DMountain HomeDISEASE, LUMBOSACRAL SPINE 08/01/2007    KGabriela Eves PT, DPT 09/18/2017, 7:31 PM  [facility] StreetMBuff[place] [address]Phone: [phone]  Fax:  [phone] Name: Rebecca MOGAMRN: [id number] of Birth: 1August 25, 1952

## 2017-09-18 NOTE — Therapy (Signed)
[facility] [place], [place], [address] Phone: [phone]   Fax:  [phone]  Physical Therapy Treatment  Patient Details  Name: Rebecca [name] MRN: [id number] Date of Birth: 02/25/1951 Referring Provider: Rayvon Char. Francesco Runner   Encounter Date: 09/18/2017  PT End of Session - 09/18/17 1541    Visit Number  7    Number of Visits  8    Date for PT Re-Evaluation  10/01/17    Authorization Type  Progress note every 10th visit    PT Start Time  1515    PT Stop Time  1610    PT Time Calculation (min)  55 min    Activity Tolerance  Patient tolerated treatment well    Behavior During Therapy  [facility] for tasks assessed/performed       Past Medical History:  Diagnosis Date  . Allergy   . Anxiety   . Arthritis   . Chronic back pain   . Chronic kidney disease    cyst on bil kidneys  . Colitis, ischemic ([place])   . Depression   . Dysphagia   . Fibromyalgia   . GERD (gastroesophageal reflux disease)   . Hiatal hernia   . Hyperlipidemia   . Hypertension   . Myocardial infarction ([place])   . 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 Peripheral neuropathy   . Schatzki's ring   . Sciatica   . Sensorineural hearing loss   . Tinnitus     Past Surgical History:  Procedure Laterality Date  . ABDOMINAL HYSTERECTOMY    . BACK SURGERY    . BLADDER SURGERY    . BREAST SURGERY    . carple tunnal  surgery     Right hand  . NECK SURGERY     cyst on neck  . pillonidal cyst     resection    There were no vitals filed for this visit.  Subjective Assessment - 09/18/17 1556    Subjective  Patient reported feeling okay.    Pertinent History  HTN, osteopenia    Limitations  Standing;Walking;House hold activities    Currently in Pain?  No/denies         [facility] PT Assessment - 09/18/17 0001      Assessment   Medical Diagnosis  Spondylosis of lumbar spine, Lumbar DDD    Prior Therapy  yes                    [facility] Adult PT Treatment/Exercise - 09/18/17 0001      Exercises   Exercises  Lumbar      Lumbar Exercises: Stretches   Lower Trunk Rotation  2 reps;30 seconds      Lumbar Exercises: Aerobic   Stationary Bike  Level 4 x15 minutes      Lumbar Exercises: Machines for Strengthening   Leg Press  1 plate 2x10 with emphasis on draw in      Lumbar Exercises: Supine   Bridge  Compliant;10 reps;20 reps x30    Other Supine Lumbar Exercises  supine clamshells x20      Modalities   Modalities  Electrical Stimulation;Moist Heat      Moist Heat Therapy   Number Minutes Moist Heat  10 Minutes    Moist Heat Location  Lumbar Spine      Electrical Stimulation   Electrical Stimulation Location  right low back    Electrical Stimulation Action  pre-mod    Electrical Stimulation  Parameters  80-150 hz x10    Electrical Stimulation Goals  Pain             PT Education - 09/18/17 1557    Education Details  rows, extension, clams shells    Person(s) Educated  Patient    Methods  Explanation;Demonstration    Comprehension  Verbalized understanding;Returned demonstration          PT Long Term Goals - 09/11/17 1733      PT LONG TERM GOAL #1   Title  Patient will be independent with HEP    Time  4    Period  Weeks    Status  Achieved      PT LONG TERM GOAL #2   Title  Patient will improve LE strength to 4+/5 bilaterally to improve stability during functional activities.     Time  4    Period  Weeks    Status  On-going      PT LONG TERM GOAL #3   Title  Patient will be able to perform functional activities with less than 3/10 pain.     Time  4    Period  Weeks    Status  On-going Still has pain up to 4/10 with ADLS and functional activitie            Plan - 09/18/17 1558    Clinical Impression Statement  Patient was able to tolerate treatment well with minimal reports of pain. Patient provided education to perform rows/extensions in doorway.  Patient reported understanding. Patient to be discharged next visit.    Clinical Presentation  Evolving    Clinical Decision Making  Low    Rehab Potential  Good    PT Frequency  2x / week    PT Duration  4 weeks    PT Treatment/Interventions  ADLs/Self Care Home Management;Electrical Stimulation;Traction;Moist Heat;Iontophoresis 22m/ml Dexamethasone;Cryotherapy;Ultrasound;Therapeutic exercise;Therapeutic activities;Functional mobility training;Stair training;Gait training;Balance training;Neuromuscular re-education;Patient/family education;Manual techniques;Passive range of motion;Dry needling;Taping    PT Next Visit Plan  Discharge    Consulted and Agree with Plan of Care  Patient       Patient will benefit from skilled therapeutic intervention in order to improve the following deficits and impairments:  Pain, Decreased activity tolerance, Decreased endurance, Decreased range of motion, Decreased strength, Decreased balance  Visit Diagnosis: Chronic low back pain, unspecified back pain laterality, with sciatica presence unspecified     Problem List Patient Active Problem List   Diagnosis Date Noted  . 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 BACK STRAIN 03/17/2010  . HEMATURIA UNSPECIFIED 02/24/2010  . Dyspareunia 02/24/2010  . OTHER SPEC HYPERTROPHIC&ATROPHIC CONDITION SKIN 02/24/2010  . DYSURIA 11/20/2008  . HYPERLIPIDEMIA 10/30/2008  . ALLERGIC RHINITIS 12/26/2007  . HEADACHE 11/27/2007  . RESTLESS LEG SYNDROME 10/03/2007  . CONSTIPATION 10/03/2007  . [place] FEMALE 10/03/2007  . 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 ANXIETY DEPRESSION 08/01/2007  . TOBACCO ABUSE 08/01/2007  . ISCHEMIC COLITIS 08/01/2007  . DHustisfordDISEASE, LUMBOSACRAL SPINE 08/01/2007    KGabriela Eves PT, DPT 09/18/2017, 7:51 PM  [facility]MYeag[place]  [address]Phone: [phone]  Fax:  [phone] Name: Rebecca PRALLEMRN: [id number] of Birth: 112-27-1952

## 2017-09-25 ENCOUNTER — Encounter: Payer: Self-pay | Admitting: Physical Therapy

## 2017-09-26 ENCOUNTER — Ambulatory Visit: Payer: Medicare HMO | Admitting: Physical Therapy

## 2017-09-26 DIAGNOSIS — G8929 Other chronic pain: Secondary | ICD-10-CM

## 2017-09-26 DIAGNOSIS — M545 Low back pain: Principal | ICD-10-CM

## 2017-09-26 NOTE — Therapy (Signed)
[facility] [place], [place], [address] Phone: [phone]   Fax:  [phone]  Physical Therapy Treatment  Patient Details  Name: Rebecca [name] MRN: [id number] Date of Birth: 01/15/1951 Referring Provider: Rayvon Char. Francesco Runner   Encounter Date: 09/26/2017  PT End of Session - 09/26/17 1429    Visit Number  8    Number of Visits  8    Date for PT Re-Evaluation  10/01/17    Authorization Type  Progress note every 10th visit    PT Start Time  1419    PT Stop Time  1513    PT Time Calculation (min)  54 min    Activity Tolerance  Patient tolerated treatment well    Behavior During Therapy  [facility] for tasks assessed/performed       Past Medical History:  Diagnosis Date  . Allergy   . Anxiety   . Arthritis   . Chronic back pain   . Chronic kidney disease    cyst on bil kidneys  . Colitis, ischemic ([place])   . Depression   . Dysphagia   . Fibromyalgia   . GERD (gastroesophageal reflux disease)   . Hiatal hernia   . Hyperlipidemia   . Hypertension   . Myocardial infarction ([place])   . 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 Peripheral neuropathy   . Schatzki's ring   . Sciatica   . Sensorineural hearing loss   . Tinnitus     Past Surgical History:  Procedure Laterality Date  . ABDOMINAL HYSTERECTOMY    . BACK SURGERY    . BLADDER SURGERY    . BREAST SURGERY    . carple tunnal  surgery     Right hand  . NECK SURGERY     cyst on neck  . pillonidal cyst     resection    There were no vitals filed for this visit.      [facility] PT Assessment - 09/26/17 0001      Assessment   Medical Diagnosis  Spondylosis of lumbar spine, Lumbar DDD    Prior Therapy  yes      Strength   Right Hip Flexion  4+/5    Right Hip Extension  4/5    Right Hip ABduction  4+/5    Left Hip Flexion  4+/5    Left Hip Extension  4-/5    Left Hip ABduction  4+/5    Right Knee Flexion  5/5    Right Knee Extension  5/5     Left Knee Flexion  5/5    Left Knee Extension  5/5                   [facility] Adult PT Treatment/Exercise - 09/26/17 0001      Lumbar Exercises: Aerobic   Stationary Bike  Level 5 x15 minutes      Lumbar Exercises: Machines for Strengthening   Leg Press  1 plate 2x10 with emphasis on draw in      Lumbar Exercises: Standing   Row  Strengthening;Both;20 reps;10 reps    Row Limitations  Pink XTS    Shoulder Extension  Strengthening;Both;10 reps;20 reps    Shoulder Extension Limitations  Pink XTS      Lumbar Exercises: Supine   Bent Knee Raise  10 reps;20 reps    Bridge  Compliant;10 reps;20 reps    Other Supine Lumbar Exercises  supine clamshells x20  Modalities   Modalities  Electrical Stimulation;Moist Heat      Moist Heat Therapy   Number Minutes Moist Heat  10 Minutes    Moist Heat Location  Lumbar Spine      Electrical Stimulation   Electrical Stimulation Location  right low back    Electrical Stimulation Action  pre-mod    Electrical Stimulation Parameters  80-150 hz x10    Electrical Stimulation Goals  Pain                  PT Long Term Goals - 09/26/17 1505      PT LONG TERM GOAL #1   Title  Patient will be independent with HEP    Time  4    Period  Weeks    Status  Achieved      PT LONG TERM GOAL #2   Title  Patient will improve LE strength to 4+/5 bilaterally to improve stability during functional activities.     Time  4    Period  Weeks    Status  Partially Met      PT LONG TERM GOAL #3   Title  Patient will be able to perform functional activities with less than 3/10 pain.     Time  4    Period  Weeks    Status  Achieved            Plan - 09/26/17 1506    Clinical Impression Statement  Patient was able to tolerate treatment well with no reports of pain. Patient was able to perform exercises with no verbal cuing and with good form. Patient was provided with HEP to maintain gains. Patient reported understanding.      Clinical Presentation  Evolving    Clinical Decision Making  Low    Rehab Potential  Good    PT Frequency  2x / week    PT Duration  4 weeks    PT Treatment/Interventions  ADLs/Self Care Home Management;Electrical Stimulation;Traction;Moist Heat;Iontophoresis 78m/ml Dexamethasone;Cryotherapy;Ultrasound;Therapeutic exercise;Therapeutic activities;Functional mobility training;Stair training;Gait training;Balance training;Neuromuscular re-education;Patient/family education;Manual techniques;Passive range of motion;Dry needling;Taping    PT Next Visit Plan  Patient Discharged at this time    PT Home Exercise Plan  draw in, marching, lower trunk rotation    Consulted and Agree with Plan of Care  Patient       Patient will benefit from skilled therapeutic intervention in order to improve the following deficits and impairments:  Pain, Decreased activity tolerance, Decreased endurance, Decreased range of motion, Decreased strength, Decreased balance  Visit Diagnosis: Chronic low back pain, unspecified back pain laterality, with sciatica presence unspecified     Problem List Patient Active Problem List   Diagnosis Date Noted  . 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 BACK STRAIN 03/17/2010  . HEMATURIA UNSPECIFIED 02/24/2010  . Dyspareunia 02/24/2010  . OTHER SPEC HYPERTROPHIC&ATROPHIC CONDITION SKIN 02/24/2010  . DYSURIA 11/20/2008  . HYPERLIPIDEMIA 10/30/2008  . ALLERGIC RHINITIS 12/26/2007  . HEADACHE 11/27/2007  . RESTLESS LEG SYNDROME 10/03/2007  . CONSTIPATION 10/03/2007  . [place] FEMALE 10/03/2007  . 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 ANXIETY DEPRESSION 08/01/2007  . TOBACCO ABUSE 08/01/2007  . ISCHEMIC COLITIS 08/01/2007  . DDuck KeyDISEASE, LUMBOSACRAL SPINE 08/01/2007    PHYSICAL THERAPY DISCHARGE SUMMARY  Visits from Start of Care: 8  Current functional level related to goals / functional  outcomes: See above   Remaining deficits: See goals   Education / Equipment: HEP  and theraband Plan: Patient agrees to discharge.  Patient goals were met. Patient is being discharged due to meeting the stated rehab goals.  ?????       Gabriela Eves, PT, DPT 09/26/2017, 3:31 PM  [facility] [address] [place], [place], [address] Phone: [phone]   Fax:  [phone]  Name: Rebecca [name] MRN: [id number] Date of Birth: [birth year]

## 2017-11-15 ENCOUNTER — Ambulatory Visit (HOSPITAL_COMMUNITY): Payer: Medicare HMO | Admitting: Psychiatry

## 2017-11-15 ENCOUNTER — Encounter (HOSPITAL_COMMUNITY): Payer: Self-pay | Admitting: Psychiatry

## 2017-11-15 VITALS — BP 120/77 | HR 94 | Ht 65.0 in | Wt 146.0 lb

## 2017-11-15 DIAGNOSIS — F331 Major depressive disorder, recurrent, moderate: Secondary | ICD-10-CM | POA: Diagnosis not present

## 2017-11-15 MED ORDER — SERTRALINE HCL 50 MG PO TABS
50.0000 mg | ORAL_TABLET | Freq: Two times a day (BID) | ORAL | 2 refills | Status: DC
Start: 2017-11-15 — End: 2018-02-18

## 2017-11-15 MED ORDER — ALPRAZOLAM 0.5 MG PO TABS
0.5000 mg | ORAL_TABLET | Freq: Four times a day (QID) | ORAL | 2 refills | Status: DC
Start: 2017-11-15 — End: 2018-02-07
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
## 2017-11-19 ENCOUNTER — Other Ambulatory Visit: Payer: Self-pay | Admitting: Otolaryngology

## 2017-11-19 DIAGNOSIS — J029 Acute pharyngitis, unspecified: Secondary | ICD-10-CM

## 2017-11-28 ENCOUNTER — Encounter: Payer: Self-pay | Admitting: Radiology

## 2017-11-28 ENCOUNTER — Ambulatory Visit
Admission: RE | Admit: 2017-11-28 | Discharge: 2017-11-28 | Disposition: A | Discharge status: Home / Self Care | Payer: Medicare HMO | Source: Ambulatory Visit | Attending: Otolaryngology | Admitting: Otolaryngology

## 2017-11-28 DIAGNOSIS — J029 Acute pharyngitis, unspecified: Secondary | ICD-10-CM

## 2017-11-28 MED ORDER — IOPAMIDOL (ISOVUE-300) INJECTION 61%
75.0000 mL | Freq: Once | INTRAVENOUS | Status: AC | PRN
  Administered 2017-11-28: 75 mL via INTRAVENOUS

## 2017-12-19 ENCOUNTER — Encounter: Payer: Self-pay | Admitting: Nurse Practitioner

## 2017-12-25 ENCOUNTER — Other Ambulatory Visit (INDEPENDENT_AMBULATORY_CARE_PROVIDER_SITE_OTHER): Payer: Medicare HMO

## 2017-12-25 ENCOUNTER — Encounter: Payer: Self-pay | Admitting: Physician Assistant

## 2017-12-25 ENCOUNTER — Ambulatory Visit: Payer: Medicare HMO | Admitting: Physician Assistant

## 2017-12-25 VITALS — BP 112/68 | HR 89 | Ht 65.0 in | Wt 144.4 lb

## 2017-12-25 DIAGNOSIS — R1011 Right upper quadrant pain: Secondary | ICD-10-CM

## 2017-12-25 DIAGNOSIS — R198 Other specified symptoms and signs involving the digestive system and abdomen: Secondary | ICD-10-CM

## 2017-12-25 DIAGNOSIS — R194 Change in bowel habit: Secondary | ICD-10-CM

## 2017-12-25 DIAGNOSIS — R1310 Dysphagia, unspecified: Secondary | ICD-10-CM

## 2017-12-25 DIAGNOSIS — K219 Gastro-esophageal reflux disease without esophagitis: Secondary | ICD-10-CM | POA: Diagnosis not present

## 2017-12-25 DIAGNOSIS — R1013 Epigastric pain: Secondary | ICD-10-CM

## 2017-12-25 LAB — BASIC METABOLIC PANEL
BUN: 7 mg/dL (ref 6–23)
CHLORIDE: 104 meq/L (ref 96–112)
CO2: 25 mEq/L (ref 19–32)
CREATININE: 0.59 mg/dL (ref 0.40–1.20)
Calcium: 9.7 mg/dL (ref 8.4–10.5)
GFR: 108.11 mL/min (ref >60.00)
GLUCOSE: 80 mg/dL (ref 70–99)
POTASSIUM: 3.9 meq/L (ref 3.5–5.1)
Sodium: 139 mEq/L (ref 135–145)

## 2017-12-25 MED ORDER — OMEPRAZOLE 20 MG PO CPDR: 20.0000 mg | DELAYED_RELEASE_CAPSULE | Freq: Two times a day (BID) | ORAL | 3 refills | Status: DC

## 2017-12-25 MED ORDER — PEG 3350-KCL-NABCB-NACL-NASULF 236 G PO SOLR: 4000.0000 mL | Freq: Once | ORAL | 0 refills | Status: AC

## 2017-12-25 MED ORDER — DICYCLOMINE HCL 10 MG PO CAPS: 10.0000 mg | ORAL_CAPSULE | Freq: Two times a day (BID) | ORAL | 0 refills | Status: DC

## 2017-12-25 NOTE — Progress Notes (Addendum)
Subjective:    Patient ID: Rebecca [name], female    DOB: 1950/10/11, 67 y.o.   MRN: [id number]  HPI Rebecca [name] is a pleasant 67 year old white female, known previously to Dr. Hilarie [name] and last seen in January 2013 when she underwent upper endoscopy with balloon dilation of a Schatzki's ring.  She also had an antral nodule biopsied with path showing benign gastric mucosa and no H. pylori.  Esophageal biopsy showed no Barrett's. She is referred today by Dr. Edrick Oh Margarite Gouge NP for evaluation of complaints of rectal pain and abdominal pain. Patient has prior history of ischemic colitis apparently in 2016, she said she had colonoscopy done per Dr. Gala [name] greater than 10 years ago and probably did have polyps. She has history of fibromyalgia, hypertension, anxiety/depression, restless leg syndrome, severe scoliosis with chronic back pain and is status post cystocele/rectocele repair several years ago and then subsequently had hysterectomy in 2016 was told that she had a lot of adhesions at that time. Recent labs were sent by her PCP with normal CBC, hemoglobin 14.5 and normal chemistries. Patient says that over the past 4 to 5 months she has noticed a change in her bowels with "pencil thin" stools, constipation predominantly but then intermittently will get bouts of uncontrollable diarrhea with incontinence.  She has no complaints of rectal bleeding.  Over the past few weeks she has noticed a sensation of internal rectal pressure but also gets a pulling sensation in her lower back at times and in the suprapubic area. She also has been having upper abdominal bloating pressure and discomfort for several months.  She says she will have episodes of feeling very distended and has more pain at that time.  She is also had intermittent cramping.  Not using any regular NSAIDs.  Appetite has been okay and weight has been stable. No recent imaging  Review of Systems Pertinent positive and negative review of  systems were noted in the above HPI section.  All other review of systems was otherwise negative.  Outpatient Encounter Medications as of 12/25/2017  Medication Sig  . acetaminophen (TYLENOL) 500 MG tablet Take 500 mg by mouth every 6 (six) hours as needed for moderate pain.  Marland Kitchen albuterol (PROVENTIL HFA;VENTOLIN HFA) 108 (90 Base) MCG/ACT inhaler Inhale 1 puff into the lungs every 6 (six) hours as needed for wheezing or shortness of breath.  . ALPRAZolam (XANAX) 0.5 MG tablet Take 1 tablet (0.5 mg total) by mouth 4 (four) times daily.  . betamethasone dipropionate (DIPROLENE) 0.05 % cream Apply 1 application topically as needed.  . clotrimazole-betamethasone (LOTRISONE) cream Apply 1 application topically 2 (two) times daily.  Marland Kitchen conjugated estrogens (PREMARIN) vaginal cream Place 1 Applicatorful vaginally daily.   . Cyanocobalamin (VITAMIN B-12 IJ) Inject as directed every 30 (thirty) days.  . fluticasone (FLONASE) 50 MCG/ACT nasal spray one spray by Both Nostrils prn  . methocarbamol (ROBAXIN) 500 MG tablet Take 500 mg by mouth 3 (three) times daily.  . montelukast (SINGULAIR) 10 MG tablet Take 10 mg by mouth as needed.  . promethazine (PHENERGAN) 25 MG tablet Take 25 mg by mouth every 6 (six) hours as needed for nausea or vomiting.  . sertraline (ZOLOFT) 50 MG tablet Take 1 tablet (50 mg total) by mouth 2 (two) times daily.  . sodium chloride (OCEAN) 0.65 % SOLN nasal spray Place 1 spray into both nostrils as needed for congestion.  . [DISCONTINUED] omeprazole (PRILOSEC) 20 MG capsule Take 20 mg by mouth daily.  Marland Kitchen  dicyclomine (BENTYL) 10 MG capsule Take 1 capsule (10 mg total) by mouth 2 (two) times daily. Take twice daily for abdominal cramping/pain  . omeprazole (PRILOSEC) 20 MG capsule Take 1 capsule (20 mg total) by mouth 2 (two) times daily before a meal.  . polyethylene glycol (GOLYTELY) 236 g solution Take 4,000 mLs by mouth once for 1 dose.  . [DISCONTINUED] cetirizine (ZYRTEC) 10 MG  tablet Take 10 mg by mouth as needed.   . [DISCONTINUED] mometasone (NASONEX) 50 MCG/ACT nasal spray Place 2 sprays into the nose daily as needed (congestion). As needed   No facility-administered encounter medications on file as of 12/25/2017.    Allergies  Allergen Reactions  . Bupropion Hcl Swelling  . Doxycycline Other (See Comments)    When  she is on Valium   . Simvastatin Other (See Comments)    Sleepy and weak  . Cephalexin     REACTION: Bruning with urination  . Penicillins     REACTION: Swelling and rash  . Toradol [Ketorolac Tromethamine] Other (See Comments)    Causes gi bleeding    . Darvocet [Propoxyphene N-Acetaminophen] Anxiety   Patient Active Problem List   Diagnosis Date Noted  . 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 BACK STRAIN 03/17/2010  . HEMATURIA UNSPECIFIED 02/24/2010  . Dyspareunia 02/24/2010  . OTHER SPEC HYPERTROPHIC&ATROPHIC CONDITION SKIN 02/24/2010  . DYSURIA 11/20/2008  . HYPERLIPIDEMIA 10/30/2008  . ALLERGIC RHINITIS 12/26/2007  . HEADACHE 11/27/2007  . RESTLESS LEG SYNDROME 10/03/2007  . CONSTIPATION 10/03/2007  . [place], FEMALE 10/03/2007  . 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 ANXIETY DEPRESSION 08/01/2007  . TOBACCO ABUSE 08/01/2007  . ISCHEMIC COLITIS 08/01/2007  . [place] DISEASE, LUMBOSACRAL SPINE 08/01/2007   Social History   Socioeconomic History  . Marital status: Married    Spouse name: Not on file  . Number of children: 2  . Years of education: Not on file  . Highest education level: Not on file  Occupational History  . Occupation: CNA  Social Needs  . Financial resource strain: Not on file  . Food insecurity:    Worry: Not on file    Inability: Not on file  . Transportation needs:    Medical: Not on file    Non-medical: Not on file  Tobacco Use  . Smoking status: Current Every Day Smoker    Years: 44.00    Types: Cigarettes  .  Smokeless tobacco: Never Used  . Tobacco comment: smokes 5 cig daily  Substance and Sexual Activity  . Alcohol use: No    Alcohol/week: 0.0 standard drinks  . Drug use: No  . Sexual activity: Yes    Birth control/protection: Surgical    Comment: hyst  Lifestyle  . Physical activity:    Days per week: Not on file    Minutes per session: Not on file  . Stress: Not on file  Relationships  . Social connections:    Talks on phone: Not on file    Gets together: Not on file    Attends religious service: Not on file    Active member of club or organization: Not on file    Attends meetings of clubs or organizations: Not on file    Relationship status: Not on file  . Intimate partner violence:    Fear of current or ex partner: Not on file    Emotionally abused: Not on file    Physically abused: Not on file  Forced sexual activity: Not on file  Other Topics Concern  . Not on file  Social History Narrative  . Not on file    Ms. St[place]m's family history includes Alcohol abuse in her father; Arthritis in her brother and brother; Cancer in her father and paternal grandfather; Colon cancer in her maternal uncle; Depression in her father; Diabetes in her maternal uncle; Heart disease in her mother; Irritable bowel syndrome in her maternal uncle; Kidney disease in her mother; Kyphosis in her maternal grandmother and mother; Osteoporosis in her maternal grandmother and mother; Scoliosis in her daughter and son; Sleep apnea in her mother; Stroke in her maternal grandfather and mother.      Objective:    Vitals:   12/25/17 1050  BP: 112/68  Pulse: 89    Physical Exam well-developed older white female in no acute distress, pleasant pressure 112/68 pulse 89, height 5 foot 5, weight 144, BMI 24.0.  HEENT ;nontraumatic normocephalic EOMI PERRLA sclera anicteric oropharynx benign, Cardiovascular ;regular rate and rhythm with S1-S2 no murmur rub or gallop, Pulmonary; clear bilaterally, Abdomen;  soft, she has some tenderness in the right mid right upper and epigastric areas there is some fullness in the right upper quadrant no definite mass or palpable hepatosplenomegaly no guarding or rebound bowel sounds are present, Rectal; exam not done today, Extremities; no clubbing cyanosis or edema skin warm and dry, Neuro psych ;alert and oriented, grossly nonfocal other than evidence of moderate hearing loss mood and affect appropriate       Assessment & Plan:   #48 67 year old white female with complaints of change in bowel habits with narrow caliber stools, constipation and intermittent episodes of urgent diarrhea over the past 4 to 5 months.  In addition she has been having internal rectal pressure. Rule out occult colon lesion, rule out intermittent episodes of low-grade obstruction, patient has prior imaging showing pelvic floor laxity and is status post hysterectomy, cystocele and rectocele repair, colonoscopy is unrevealing would suspect her symptoms secondary to pelvic floor descent, and may need referral back to GYN  #2 chronic GERD that is post previous dilation of Schatzki's ring.  Patient is been having increased symptoms recently and intermittent solid food dysphasia rule out recurrent stricture/ring  #3 right upper quadrant and epigastric pain and pressure intermittent distention-   For fibromyalgia 5.  Hypertension 6.  Restless leg syndrome 7.  History of ischemic colitis  Plan; Increase omeprazole to 20 mg p.o. twice daily AC breakfast and AC dinner Continue strict antireflux regimen Add Bentyl 10 mg p.o. every morning and may use twice daily as needed for abdominal pain and cramping Schedule for CT scan of the abdomen and pelvis with contrast Patient will also be scheduled for EGD with possible dilation and Colonoscopy.  Both procedures were discussed in detail with patient including indications risks benefits and she is agreeable to proceed.  Patient will be scheduled with  Dr. Rush Landmark, for procedures as Dr. Hilarie [name] has no procedure availability.  She was agreeable with this plan.  Amy S Esterwood PA-C 12/25/2017   Cc: Dione Housekeeper, MD   Addendum: Reviewed and agree with initial management. Pyrtle, Lajuan Lines, MD

## 2017-12-25 NOTE — Progress Notes (Signed)
Agree with assessment and plan.  CT ideally is done before colonoscopy in case lesion is noted.  Double procedure for symptoms is reasonable as well.

## 2017-12-25 NOTE — Patient Instructions (Addendum)
If you are age 67 or older, your body mass index should be between 23-30. Your Body mass index is 24.03 kg/m. If this is out of the aforementioned range listed, please consider follow up with your Primary Care Provider.  If you are age 20 or younger, your body mass index should be between 19-25. Your Body mass index is 24.03 kg/m. If this is out of the aformentioned range listed, please consider follow up with your Primary Care Provider.   You have been scheduled for an endoscopy and colonoscopy. Please follow the written instructions given to you at your visit today. Please pick up your prep supplies at the pharmacy within the next 1-3 days. If you use inhalers (even only as needed), please bring them with you on the day of your procedure. Your physician has requested that you go to www.startemmi.com and enter the access code given to you at your visit today. This web site gives a general overview about your procedure. However, you should still follow specific instructions given to you by our office regarding your preparation for the procedure.   We have sent the following medications to your pharmacy for you to pick up at your convenience: Golytely Bentyl Omeprazole - increase to twice daily.  Try Balmex cream for perianal irritation.  Your provider has requested that you go to the basement level for lab work before leaving today. Press "B" on the elevator. The lab is located at the first door on the left as you exit the elevator. BMET   You have been scheduled for a CT scan of the abdomen and pelvis at [facility] ([address]---this is in the same building as [facility]).   You are scheduled on 01/04/18 at 1:30 PM. You should arrive 15 minutes prior to your appointment time for registration. Please follow the written instructions below on the day of your exam:  WARNING: IF YOU ARE ALLERGIC TO IODINE/X-RAY DYE, PLEASE NOTIFY RADIOLOGY IMMEDIATELY AT [phone]!  YOU WILL BE GIVEN A 13 HOUR PREMEDICATION PREP.  1) Do not eat anything after 9:30 AM (4 hours prior to your test) 2) You have been given 2 bottles of oral contrast to drink. The solution may taste better if refrigerated, but do NOT add ice or any other liquid to this solution. Shake well before drinking.    Drink 1 bottle of contrast @ 11:30 AM (2 hours prior to your exam)  Drink 1 bottle of contrast @ 12:30 PM (1 hour prior to your exam)  You may take any medications as prescribed with a small amount of water except for the following: Metformin, Glucophage, Glucovance, Avandamet, Riomet, Fortamet, Actoplus Met, Janumet, Glumetza or Metaglip. The above medications must be held the day of the exam AND 48 hours after the exam.  The purpose of you drinking the oral contrast is to aid in the visualization of your intestinal tract. The contrast solution may cause some diarrhea. Before your exam is started, you will be given a small amount of fluid to drink. Depending on your individual set of symptoms, you may also receive an intravenous injection of x-ray contrast/dye. Plan on being at [facility] for 30 minutes or longer, depending on the type of exam you are having performed.  This test typically takes 30-45 minutes to complete.  If you have any questions regarding your exam or if you need to reschedule, you may call the CT department at [phone] between the hours of 8:00 am and 5:00  pm, Monday-Friday.  ______________________________________________________________________   Thank you for choosing me and [facility].   Amy Esterwood, PA-C

## 2017-12-26 ENCOUNTER — Encounter: Payer: Self-pay | Admitting: Gastroenterology

## 2018-01-04 ENCOUNTER — Ambulatory Visit (INDEPENDENT_AMBULATORY_CARE_PROVIDER_SITE_OTHER)
Admission: RE | Admit: 2018-01-04 | Discharge: 2018-01-04 | Disposition: A | Discharge status: Home / Self Care | Payer: Medicare HMO | Source: Ambulatory Visit | Attending: Physician Assistant | Admitting: Physician Assistant

## 2018-01-04 DIAGNOSIS — R1013 Epigastric pain: Secondary | ICD-10-CM | POA: Diagnosis not present

## 2018-01-04 DIAGNOSIS — R1011 Right upper quadrant pain: Secondary | ICD-10-CM

## 2018-01-04 MED ORDER — IOPAMIDOL (ISOVUE-300) INJECTION 61%
100.0000 mL | Freq: Once | INTRAVENOUS | Status: AC | PRN
  Administered 2018-01-04: 100 mL via INTRAVENOUS

## 2018-01-07 ENCOUNTER — Encounter: Payer: Self-pay | Admitting: Gastroenterology

## 2018-01-07 ENCOUNTER — Ambulatory Visit (AMBULATORY_SURGERY_CENTER): Payer: Medicare HMO | Admitting: Gastroenterology

## 2018-01-07 VITALS — BP 147/78 | HR 79 | Temp 98.9°F | Resp 17 | Ht 65.0 in | Wt 144.0 lb

## 2018-01-07 DIAGNOSIS — D129 Benign neoplasm of anus and anal canal: Secondary | ICD-10-CM

## 2018-01-07 DIAGNOSIS — K259 Gastric ulcer, unspecified as acute or chronic, without hemorrhage or perforation: Secondary | ICD-10-CM | POA: Diagnosis not present

## 2018-01-07 DIAGNOSIS — R194 Change in bowel habit: Secondary | ICD-10-CM | POA: Diagnosis not present

## 2018-01-07 DIAGNOSIS — D123 Benign neoplasm of transverse colon: Secondary | ICD-10-CM

## 2018-01-07 DIAGNOSIS — K635 Polyp of colon: Secondary | ICD-10-CM

## 2018-01-07 DIAGNOSIS — R1013 Epigastric pain: Secondary | ICD-10-CM | POA: Diagnosis not present

## 2018-01-07 DIAGNOSIS — K222 Esophageal obstruction: Secondary | ICD-10-CM

## 2018-01-07 DIAGNOSIS — D128 Benign neoplasm of rectum: Secondary | ICD-10-CM

## 2018-01-07 DIAGNOSIS — R1011 Right upper quadrant pain: Secondary | ICD-10-CM

## 2018-01-07 DIAGNOSIS — K621 Rectal polyp: Secondary | ICD-10-CM | POA: Diagnosis not present

## 2018-01-07 MED ORDER — SODIUM CHLORIDE 0.9 % IV SOLN: 500.0000 mL | Freq: Once | INTRAVENOUS | Status: DC

## 2018-01-07 NOTE — Op Note (Signed)
[facility] Patient Name: Rebecca [name] Procedure Date: 01/07/2018 3:37 PM MRN: [id number] Endoscopist: Justice Britain , MD Age: 67 Referring MD:  Date of Birth: February 20, 1951 Gender: Female Account #: [id number] Procedure:                Upper GI endoscopy Indications:              Epigastric abdominal pain, Dysphagia, Heartburn,                            Exclusion of esophageal stenosis, For therapy of                            esophageal stenosis Medicines:                Monitored Anesthesia Care Procedure:                Pre-Anesthesia Assessment:                           - Prior to the procedure, a History and Physical                            was performed, and patient medications and                            allergies were reviewed. The patient's tolerance of                            previous anesthesia was also reviewed. The risks                            and benefits of the procedure and the sedation                            options and risks were discussed with the patient.                            All questions were answered, and informed consent                            was obtained. Prior Anticoagulants: The patient has                            taken no previous anticoagulant or antiplatelet                            agents. ASA Grade Assessment: III - A patient with                            severe systemic disease. After reviewing the risks                            and benefits, the patient was deemed in  satisfactory condition to undergo the procedure.                           After obtaining informed consent, the endoscope was                            passed under direct vision. Throughout the                            procedure, the patient's blood pressure, pulse, and                            oxygen saturations were monitored continuously. The                            Endoscope was introduced  through the mouth, and                            advanced to the third part of duodenum. The upper                            GI endoscopy was accomplished without difficulty.                            The patient tolerated the procedure. Scope In: Scope Out: Findings:                 No gross lesions were noted in the proximal                            esophagus and in the mid esophagus.                           A widely patent and non-obstructing Schatzki ring                            was found in the distal esophagus and at the                            gastroesophageal junction. This was interrupted                            with biopsy forceps (tissue was not sent for                            pathology). A TTS dilator was passed through the                            scope. Dilation with a 01-19-11 mm balloon and then                            a 12-13.5-15 mm balloon dilator was performed to  15.5 mm. The dilation site was examined and showed                            mild mucosal disruption and mild improvement in                            luminal narrowing - though not clear how much this                            was playing a role.                           A small hiatal hernia was present.                           Multiple dispersed, small non-bleeding erosions                            were found in the gastric body, at the incisura and                            in the gastric antrum. There were no stigmata of                            recent bleeding.                           A single 7 mm submucosal papule (nodule) was found                            in the gastric antrum. Tunneled biopsies were taken                            with a cold forceps for histology.                           No other gross lesions were noted in the entire                            examined stomach. Biopsies were taken with a cold                             forceps for histology and Helicobacter pylori                            testing.                           No gross lesions were noted in the duodenal bulb,                            in the first portion of the duodenum and in the  second portion of the duodenum.                           A medium diverticulum was found in the region of                            the papilla. Complications:            No immediate complications. Estimated Blood Loss:     Estimated blood loss was minimal. Impression:               - No gross lesions in proximal or middle esophagus.                            Widely patent and non-obstructing Schatzki ring.                            Interrupted via biopsy. Dilated.                           - Small hiatal hernia.                           - Non-bleeding erosive gastropathy.                           - A single submucosal papule (nodule) found in the                            stomach. Tunnel biopsied.                           - No gross lesions in the stomach. Biopsied.                           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 Recommendation:           - Proceed to scheduled colonoscopy.                           - Await pathology results.                           - Further evaluation of the submucosal lesion may                            be considered dependent on final results.                           -  Observe patient's clinical course.                           - Continue PPI twice daily.                           - No ibuprofen, naproxen, or other non-steroidal                            anti-inflammatory drugs.                           - The findings and recommendations were  discussed                            with the patient.                           - The findings and recommendations were discussed                            with the patient's family. Justice Britain, MD 01/07/2018 4:39:56 PM

## 2018-01-07 NOTE — Op Note (Signed)
[facility] Patient Name: Rebecca [name] Procedure Date: 01/07/2018 3:36 PM MRN: [id number] Endoscopist: Justice Britain , MD Age: 67 Referring MD:  Date of Birth: Apr 02, 1951 Gender: Female Account #: [id number] Procedure:                Colonoscopy Indications:              Generalized abdominal pain, Rectal pain Medicines:                Monitored Anesthesia Care Procedure:                Pre-Anesthesia Assessment:                           - Prior to the procedure, a History and Physical                            was performed, and patient medications and                            allergies were reviewed. The patient's tolerance of                            previous anesthesia was also reviewed. The risks                            and benefits of the procedure and the sedation                            options and risks were discussed with the patient.                            All questions were answered, and informed consent                            was obtained. Prior Anticoagulants: The patient has                            taken no previous anticoagulant or antiplatelet                            agents. ASA Grade Assessment: III - A patient with                            severe systemic disease. After reviewing the risks                            and benefits, the patient was deemed in                            satisfactory condition to undergo the procedure.                           After obtaining informed consent, the colonoscope  was passed under direct vision. Throughout the                            procedure, the patient's blood pressure, pulse, and                            oxygen saturations were monitored continuously. The                            Model PCF-H190DL [id number]) scope was introduced                            through the anus and advanced to the the cecum,                            identified  by appendiceal orifice and ileocecal                            valve. The quality of the bowel preparation was                            evaluated using the BBPS [facility] Bowel Preparation                            Scale) with scores of: Right Colon = 2 (minor                            amount of residual staining, small fragments of                            stool and/or opaque liquid, but mucosa seen well),                            Transverse Colon = 2 (minor amount of residual                            staining, small fragments of stool and/or opaque                            liquid, but mucosa seen well) and Left Colon = 3                            (entire mucosa seen well with no residual staining,                            small fragments of stool or opaque liquid). The                            total BBPS score equals 7. The quality of the bowel                            preparation was fair. The colonoscopy was  technically difficult and complex due to restricted                            mobility of the colon and significant looping.                            Successful completion of the procedure was aided by                            changing the patient's position, using manual                            pressure, withdrawing and reinserting the scope,                            straightening and shortening the scope to obtain                            bowel loop reduction, using scope torsion and                            applying abdominal pressure. Scope In: 4:07:57 PM Scope Out: 4:28:08 PM Scope Withdrawal Time: 0 hours 11 minutes 9 seconds  Total Procedure Duration: 0 hours 20 minutes 11 seconds  Findings:                 The digital rectal exam findings include                            non-thrombosed external hemorrhoids. Pertinent                            negatives include no palpable rectal lesions.                            Two sessile polyps were found in the transverse                            colon. The polyps were 2 to 3 mm in size. These                            polyps were removed with a cold snare. Resection                            and retrieval were complete.                           A 1 mm polyp was found in the rectum. The polyp was                            sessile. The polyp was removed with a cold biopsy                            forceps. Resection and retrieval were  complete.                           Normal mucosa was found in the entire colon                            otherwise. Biopsies were taken from the rectum with                            a cold forceps for histology to rule out proctitis.                           Non-bleeding non-thrombosed external and internal                            hemorrhoids were found during digital exam. Complications:            No immediate complications. Estimated Blood Loss:     Estimated blood loss was minimal. Impression:               - Preparation of the colon was fair.                           - Non-thrombosed external hemorrhoids found on                            digital rectal exam.                           - Colon was affixed due to previous surgical                            interventions.                           - Two 2 to 3 mm polyps in the transverse colon,                            removed with a cold snare. Resected and retrieved.                           - One 1 mm polyp in the rectum, removed with a cold                            biopsy forceps. Resected and retrieved.                           - Normal mucosa in the entire examined colon                            otherwise. Biopsied to rule out proctitis.                           - Non-bleeding non-thrombosed external and internal  hemorrhoids. Recommendation:           - The patient will be observed post-procedure,                             until all discharge criteria are met.                           - Discharge patient to home.                           - Patient has a contact number available for                            emergencies. The signs and symptoms of potential                            delayed complications were discussed with the                            patient. Return to normal activities tomorrow.                            Written discharge instructions were provided to the                            patient.                           - Resume previous diet.                           - Continue present medications.                           - Await pathology results.                           - Follow up in GI clinic as already planned.                           - Repeat colonoscopy for surveillance based on                            pathology results.                           - The findings and recommendations were discussed                            with the patient.                           - The findings and recommendations were discussed                            with the patient's family. Justice Britain, MD 01/07/2018 4:47:02 PM

## 2018-01-07 NOTE — Progress Notes (Signed)
To PACU, VSS. Report to [email or web address] 

## 2018-01-07 NOTE — Progress Notes (Signed)
Called to room to assist during endoscopic procedure.  Patient ID and intended procedure confirmed with present staff. Received instructions for my participation in the procedure from the performing physician.  

## 2018-01-07 NOTE — Patient Instructions (Signed)
Handouts given on polyps, dilation diet, and gastritis.   YOU HAD AN ENDOSCOPIC PROCEDURE TODAY AT [facility]:   Refer to the procedure report that was given to you for any specific questions about what was found during the examination.  If the procedure report does not answer your questions, please call your gastroenterologist to clarify.  If you requested that your care partner not be given the details of your procedure findings, then the procedure report has been included in a sealed envelope for you to review at your convenience later.  YOU SHOULD EXPECT: Some feelings of bloating in the abdomen. Passage of more gas than usual.  Walking can help get rid of the air that was put into your GI tract during the procedure and reduce the bloating. If you had a lower endoscopy (such as a colonoscopy or flexible sigmoidoscopy) you may notice spotting of blood in your stool or on the toilet paper. If you underwent a bowel prep for your procedure, you may not have a normal bowel movement for a few days.  Please Note:  You might notice some irritation and congestion in your nose or some drainage.  This is from the oxygen used during your procedure.  There is no need for concern and it should clear up in a day or so.  SYMPTOMS TO REPORT IMMEDIATELY:   Following lower endoscopy (colonoscopy or flexible sigmoidoscopy):  Excessive amounts of blood in the stool  Significant tenderness or worsening of abdominal pains  Swelling of the abdomen that is new, acute  Fever of 100F or higher   Following upper endoscopy (EGD)  Vomiting of blood or coffee ground material  New chest pain or pain under the shoulder blades  Painful or persistently difficult swallowing  New shortness of breath  Fever of 100F or higher  Black, tarry-looking stools  For urgent or emergent issues, a gastroenterologist can be reached at any hour by calling [phone].   DIET:  We do recommend a small meal at  first, but then you may proceed to your regular diet.  Drink plenty of fluids but you should avoid alcoholic beverages for 24 hours.  ACTIVITY:  You should plan to take it easy for the rest of today and you should NOT DRIVE or use heavy machinery until tomorrow (because of the sedation medicines used during the test).    FOLLOW UP: Our staff will call the number listed on your records the next business day following your procedure to check on you and address any questions or concerns that you may have regarding the information given to you following your procedure. If we do not reach you, we will leave a message.  However, if you are feeling well and you are not experiencing any problems, there is no need to return our call.  We will assume that you have returned to your regular daily activities without incident.  If any biopsies were taken you will be contacted by phone or by letter within the next 1-3 weeks.  Please call us at [phone] if you have not heard about the biopsies in 3 weeks.    SIGNATURES/CONFIDENTIALITY: You and/or your care partner have signed paperwork which will be entered into your electronic medical record.  These signatures attest to the fact that that the information above on your After Visit Summary has been reviewed and is understood.  Full responsibility of the confidentiality of this discharge information lies with you and/or your care-partner.

## 2018-01-08 ENCOUNTER — Telehealth: Payer: Self-pay

## 2018-01-08 ENCOUNTER — Telehealth: Payer: Self-pay | Admitting: Gastroenterology

## 2018-01-08 NOTE — Telephone Encounter (Signed)
The pt wanted to see what she could eat and if she could take tylenol.  She was advised to eat what she could tolerate and tylenol at the recommended dose is ok.  She will call back with any further questions.

## 2018-01-08 NOTE — Telephone Encounter (Signed)
Left message on machine to call back  

## 2018-01-08 NOTE — Telephone Encounter (Signed)
Patient has several questions regarding her procedure done yesterday that she would like to speak to the nurse about. Patient wanting to know if she can take Asprin due to being sore, along with other things.

## 2018-01-08 NOTE — Telephone Encounter (Signed)
  Follow up Call-  Call back number 01/07/2018  Post procedure Call Back phone  # [phone]  Permission to leave phone message Yes  Some recent data might be hidden     Patient questions:  Do you have a fever, pain , or abdominal swelling? No. Pain Score  0 *  Have you tolerated food without any problems? Yes.    Have you been able to return to your normal activities? Yes.    Do you have any questions about your discharge instructions: Diet   No. Medications  No. Follow up visit  No.  Do you have questions or concerns about your Care? No.  Actions: * If pain score is 4 or above: No action needed, pain <4.

## 2018-01-16 ENCOUNTER — Other Ambulatory Visit: Payer: Self-pay | Admitting: Physician Assistant

## 2018-01-17 ENCOUNTER — Encounter: Payer: Self-pay | Admitting: Gastroenterology

## 2018-02-06 ENCOUNTER — Telehealth (HOSPITAL_COMMUNITY): Payer: Self-pay | Admitting: *Deleted

## 2018-02-06 NOTE — Telephone Encounter (Signed)
Dr Harrington Challenger [facility] called Xanax 0.5 mg is on back order & they asked if a script for  1 mg & take 1/2 tablet (0.5 mg total) by mouth 4 (four) times daily?

## 2018-02-07 ENCOUNTER — Other Ambulatory Visit (HOSPITAL_COMMUNITY): Payer: Self-pay | Admitting: Psychiatry

## 2018-02-07 MED ORDER — ALPRAZOLAM 1 MG PO TABS: ORAL_TABLET | ORAL | 2 refills | Status: DC

## 2018-02-07 NOTE — Telephone Encounter (Signed)
done

## 2018-02-18 ENCOUNTER — Telehealth: Payer: Self-pay | Admitting: Physician Assistant

## 2018-02-18 ENCOUNTER — Ambulatory Visit (INDEPENDENT_AMBULATORY_CARE_PROVIDER_SITE_OTHER): Payer: Medicare HMO | Admitting: Psychiatry

## 2018-02-18 ENCOUNTER — Encounter (HOSPITAL_COMMUNITY): Payer: Self-pay | Admitting: Psychiatry

## 2018-02-18 VITALS — BP 146/80 | HR 84 | Ht 65.0 in | Wt 143.0 lb

## 2018-02-18 DIAGNOSIS — F331 Major depressive disorder, recurrent, moderate: Secondary | ICD-10-CM | POA: Diagnosis not present

## 2018-02-18 MED ORDER — ALPRAZOLAM 1 MG PO TABS: ORAL_TABLET | ORAL | 2 refills | Status: DC

## 2018-02-18 MED ORDER — SERTRALINE HCL 50 MG PO TABS: 50.0000 mg | ORAL_TABLET | Freq: Two times a day (BID) | ORAL | 2 refills | Status: DC
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
## 2018-02-18 NOTE — Telephone Encounter (Signed)
Ms. Rebecca [name] read her CT report from 01/05/18. She saw "ventral hernia" mentioned. She is calling to discuss this and the symptoms she feels are connected to this. She reports "thin stools"  "pain around the navel sometimes" and "pressure in my abdomen." States she cannot wear anything with a waistband due to the discomfort it causes in her abdomen. Do you want to make a surgical referral? She also mentions belching, nausea and painful intercourse.

## 2018-02-19 NOTE — Telephone Encounter (Signed)
Records faxed to [facility]

## 2018-02-19 NOTE — Telephone Encounter (Signed)
We can make surgical referral if pt desires - I am also going to ask radiology to ammend the report as they did not comment where on abdominal  wall the ventral hernia is and whether or not it contained fat or any bowel .  We have not deen her since her procedures in September - may be a good idea to also get her back in with Dr Hilarie Fredrickson

## 2018-02-22 ENCOUNTER — Other Ambulatory Visit: Payer: Self-pay | Admitting: Surgery

## 2018-02-22 DIAGNOSIS — R1011 Right upper quadrant pain: Secondary | ICD-10-CM

## 2018-03-05 ENCOUNTER — Ambulatory Visit (HOSPITAL_COMMUNITY)
Admission: RE | Admit: 2018-03-05 | Discharge: 2018-03-05 | Disposition: A | Discharge status: Home / Self Care | Payer: Medicare HMO | Source: Ambulatory Visit | Attending: Surgery | Admitting: Surgery

## 2018-03-05 DIAGNOSIS — R1011 Right upper quadrant pain: Secondary | ICD-10-CM | POA: Diagnosis not present

## 2018-03-05 MED ORDER — TECHNETIUM TC 99M MEBROFENIN IV KIT
5.0000 | PACK | Freq: Once | INTRAVENOUS | Status: AC | PRN
  Administered 2018-03-05: 5.5 via INTRAVENOUS

## 2018-03-11 ENCOUNTER — Ambulatory Visit: Payer: Self-pay | Admitting: Surgery

## 2018-03-11 NOTE — H&P (Signed)
History of Present Illness Rebecca [name]. Jullisa Grigoryan MD; 02/23/2018 3:55 PM) The patient is a 67 year old female who presents with abdominal pain. Referred by Dr. Zenovia Jarred for possible ventral hernia  This is a 67 year old female with multiple medical issues who was evaluated by GI in September 2019 with multiple abdominal complaints. She had complaints of pain in her pelvis near her rectum as well as right upper quadrant and epigastric abdominal pain. She occasionally has some left lower quadrant abdominal pain.  The patient has a history of ischemic colitis. Her last colonoscopy was 10 years ago. She also has a history of fibromyalgia, hypertension, anxiety and depression, restless leg syndrome, severe scoliosis with chronic back pain. She has had repair of cystocele and rectocele several years ago. She also had a hysterectomy. At that time she was told that she had a lot of intra-abdominal adhesions. Over the last several months she noticed a change in her bowel movements with smaller pencil thin stools alternating between constipation and diarrhea. She reports no rectal bleeding. She does have a lot of pressure internally in the rectum. She does have some stress urinary incontinence. She also describes significant upper abdominal bloating, pressure, and right upper quadrant abdominal pain. This pain does not seem to be exacerbated by eating. Apparently she was told several years ago that she had a bad gallbladder but she was never referred for cholecystectomy. It is unclear why she was told that she had a "bad gallbladder".  She underwent EGD with dilation as well as colonoscopy on 01/07/18. She had 3 small polyps in the transverse colon and rectum that were benign. She has a small hiatal hernia and had some antral erosions that were not bleeding.  CLINICAL DATA: RIGHT-sided abdominal pain. Change in bowel habits. Prior hysterectomy.  EXAM: CT ABDOMEN AND PELVIS WITH  CONTRAST  TECHNIQUE: Multidetector CT imaging of the abdomen and pelvis was performed using the standard protocol following bolus administration of intravenous contrast.  CONTRAST: 13m ISOVUE-300 IOPAMIDOL (ISOVUE-300) INJECTION 61%  COMPARISON: CT 03/11/2017  FINDINGS: Lower chest: Lung bases are clear.  Hepatobiliary: No focal hepatic lesion. No biliary duct dilatation. Gallbladder is normal. Common bile duct is normal.  Pancreas: Low-attenuation the pancreatic head is similar to comparison exam 2016 and favored focal fatty infiltration. No pancreatic duct dilatation. No common bile duct dilatation.  Spleen: Normal spleen  Adrenals/urinary tract: Adrenal glands normal. 15 mm simple fluid attenuation lesion in the RIGHT kidney. Small simple fluid attenuation lesions in the LEFT kidney. Ureters and bladder normal.  Stomach/Bowel: Stomach, duodenum, small-bowel are normal. Appendix normal. Ascending, transverse, descending colon normal. Rectosigmoid colon normal.  Vascular/Lymphatic: Abdominal aorta is normal caliber with atherosclerotic calcification. There is no retroperitoneal or periportal lymphadenopathy. No pelvic lymphadenopathy.  Reproductive: Post hysterectomy. No adnexal abnormality.  Other: No free fluid. Ventral hernia.  Musculoskeletal: No aggressive osseous lesion.  IMPRESSION: 1. No acute findings in the abdomen or pelvis. 2. No bowel abnormality. 3. Benign-appearing low-density lesions in the kidneys. 4. Aortic Atherosclerosis (ICD10-I70.0).  Electronically Signed: By: SSuzy BouchardM.D. On: 01/05/2018 12:53   ADDENDUM REPORT: 02/20/2018 11:39  ADDENDUM: Voice recognition error: Findings section: NO ventral hernia.   Electronically Signed By: SSuzy BouchardM.D. On: 02/20/2018 11:39   Allergies (Emeline Gins CMA; 02/22/2018 9:49 AM) Penicillins  Doxycycline *DERMATOLOGICALS*  Wellbutrin *ANTIDEPRESSANTS*  Chantix  *PSYCHOTHERAPEUTIC AND NEUROLOGICAL AGENTS - MISC.*  Allergies Reconciled   Medication History (Emeline Gins CMA; 02/22/2018 9:49 AM) ALPRAZolam (0.5MG Tablet, Oral) Active. raNITIdine HCl (  150MG Tablet, Oral) Active. Sertraline HCl (50MG Tablet, Oral) Active. Medications Reconciled  Vitals Emeline Gins CMA; 02/22/2018 9:46 AM) 02/22/2018 9:46 AM Weight: 146 lb Height: 66in Body Surface Area: 1.75 m Body Mass Index: 23.56 kg/m  Temp.: 98.53F  Pulse: 98 (Regular)  BP: 132/82 (Sitting, Left Arm, Standard)       Physical Exam Rodman Key K. Bralen Wiltgen MD; 02/23/2018 3:56 PM) The physical exam findings are as follows: Note:WDWN in NAD - smells heavily of tobacco smoke Eyes: Pupils equal, round; sclera anicteric HENT: Oral mucosa moist; Neck: No masses palpated, no thyromegaly Lungs: CTA bilaterally; normal respiratory effort CV: Regular rate and rhythm; no murmurs; extremities well-perfused with no edema Abd: +bowel sounds, soft, tender in RUQ, epigastrium, and suprapubic region, no palpable organomegaly; no palpable hernias Skin: Warm, dry; no sign of jaundice Psychiatric - alert and oriented x 4; calm mood and affect    Assessment & Plan Rodman Key K. 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 Routine. CONTINUOUS RUQ ABDOMINAL PAIN (R10.11) Current Plans HEPATOBILIARY SCAN (60677) Instructed to make follow-up appointment for office visit following completion of diagnostic tests Note:The patient does not have a ventral hernia, as originally noted on the CT scan. The addendum to the CT scan notes "no ventral hernia"  The pressure and pain in her pelvis and around her rectum may be due to rectocele and cystocele. Refer to Urology for evaluation.  The continuous RUQ abdominal pain may be related to her gallbladder. No sign of stones on CT scan. We will  evaluate with HIDA scan.   HIDA scan showed low ejection fraction of 22%.  We will proceed with laparoscopic cholecystectomy with intraoperative cholangiogram.  The surgical procedure has been discussed with the patient.  Potential risks, benefits, alternative treatments, and expected outcomes have been explained.  All of the patient's questions at this time have been answered.  The likelihood of reaching the patient's treatment goal is good.  The patient understand the proposed surgical procedure and wishes to proceed.

## 2018-03-11 NOTE — Progress Notes (Signed)
Please call the patient and let them know that their HIDA scan showed that her gallbladder is not functioning properly.  Some of her symptoms are likely related to her gallbladder so she would probably benefit from having it removed.  I can see her this week to discuss surgery.  I will go ahead and put in the orders in case she wants it done in December.

## 2018-03-13 ENCOUNTER — Ambulatory Visit: Payer: Self-pay | Admitting: Surgery

## 2018-03-13 NOTE — H&P (Signed)
History of Present Illness Rebecca [name]. Yeison Sippel MD; 03/13/2018 5:59 PM) The patient is a 67 year old female who presents for evaluation of gall stones. Referred by Dr. Zenovia Jarred for possible ventral hernia  This is a 67 year old female with multiple medical issues who was evaluated by GI in September 2019 with multiple abdominal complaints. She had complaints of pain in her pelvis near her rectum as well as right upper quadrant and epigastric abdominal pain. She occasionally has some left lower quadrant abdominal pain.  The patient has a history of ischemic colitis. Her last colonoscopy was 10 years ago. She also has a history of fibromyalgia, hypertension, anxiety and depression, restless leg syndrome, severe scoliosis with chronic back pain. She has had repair of cystocele and rectocele several years ago. She also had a hysterectomy. At that time she was told that she had a lot of intra-abdominal adhesions. Over the last several months she noticed a change in her bowel movements with smaller pencil thin stools alternating between constipation and diarrhea. She reports no rectal bleeding. She does have a lot of pressure internally in the rectum. She does have some stress urinary incontinence. She also describes significant upper abdominal bloating, pressure, and right upper quadrant abdominal pain. This pain does not seem to be exacerbated by eating. Apparently she was told several years ago that she had a bad gallbladder but she was never referred for cholecystectomy. It is unclear why she was told that she had a "bad gallbladder".  She underwent EGD with dilation as well as colonoscopy on 01/07/18. She had 3 small polyps in the transverse colon and rectum that were benign. She has a small hiatal hernia and had some antral erosions that were not bleeding.  She has been referred to Urology for the cystocele, rectocele. HIDA scan showed a low GB EF. She presents now to discuss her upcoming  surgery. She is apprehensive because her husband had a bile duct injury when he had his cholecystectomy.   CLINICAL DATA: RIGHT-sided abdominal pain. Change in bowel habits. Prior hysterectomy.  EXAM: CT ABDOMEN AND PELVIS WITH CONTRAST  TECHNIQUE: Multidetector CT imaging of the abdomen and pelvis was performed using the standard protocol following bolus administration of intravenous contrast.  CONTRAST: 162m ISOVUE-300 IOPAMIDOL (ISOVUE-300) INJECTION 61%  COMPARISON: CT 03/11/2017  FINDINGS: Lower chest: Lung bases are clear.  Hepatobiliary: No focal hepatic lesion. No biliary duct dilatation. Gallbladder is normal. Common bile duct is normal.  Pancreas: Low-attenuation the pancreatic head is similar to comparison exam 2016 and favored focal fatty infiltration. No pancreatic duct dilatation. No common bile duct dilatation.  Spleen: Normal spleen  Adrenals/urinary tract: Adrenal glands normal. 15 mm simple fluid attenuation lesion in the RIGHT kidney. Small simple fluid attenuation lesions in the LEFT kidney. Ureters and bladder normal.  Stomach/Bowel: Stomach, duodenum, small-bowel are normal. Appendix normal. Ascending, transverse, descending colon normal. Rectosigmoid colon normal.  Vascular/Lymphatic: Abdominal aorta is normal caliber with atherosclerotic calcification. There is no retroperitoneal or periportal lymphadenopathy. No pelvic lymphadenopathy.  Reproductive: Post hysterectomy. No adnexal abnormality.  Other: No free fluid. Ventral hernia.  Musculoskeletal: No aggressive osseous lesion.  IMPRESSION: 1. No acute findings in the abdomen or pelvis. 2. No bowel abnormality. 3. Benign-appearing low-density lesions in the kidneys. 4. Aortic Atherosclerosis (ICD10-I70.0).  Electronically Signed: By: SSuzy BouchardM.D. On: 01/05/2018 12:53   ADDENDUM REPORT: 02/20/2018 11:39  ADDENDUM: Voice recognition error: Findings section: NO  ventral hernia.   Electronically Signed By: SSuzy BouchardM.D. On: 02/20/2018  11:39  CLINICAL DATA: Abdominal pain and bloating  EXAM: NUCLEAR MEDICINE HEPATOBILIARY IMAGING WITH GALLBLADDER EF  VIEWS: Anterior right upper quadrant  RADIOPHARMACEUTICALS: 5.5 mCi Tc-59mCholetec IV  COMPARISON: None.  FINDINGS: Liver uptake of radiotracer is unremarkable. There is prompt visualization of gallbladder and small bowel, indicating patency of the cystic and common bile ducts. Patient consumed 8 ounces of Ensure orally with calculation of the computer generated ejection fraction of radiotracer from the gallbladder. The patient did not experience clinical symptoms with the oral Ensure consumption. There was extensive motion of patient in gallbladder during the ejection fraction calculation. Toward the end of the study, there was less motion. There is thought to be approximately 22% ejection fraction of radiotracer from the gallbladder taking these factors into account, normal greater than 33% using the oral agent.  IMPRESSION: Exact ejection fraction difficult to calculate due to patient in gallbladder motion. It is felt that the ejection fraction is below 33%, an estimated to be closer to 22%. This value is abnormally low and felt to be indicative of a degree of biliary dyskinesia.  Cystic and common bile ducts are patent as is evidenced by visualization of gallbladder and small bowel.   Electronically Signed By: WLowella GripIII M.D. On: 03/05/2018 14:13   Problem List/Past Medical (Rodman KeyK. Saran Laviolette, MD; 03/13/2018 5:59 PM) CONTINUOUS RUQ ABDOMINAL PAIN (R10.11) CYSTOCELE WITH RECTOCELE (N81.10, N81.6) CHRONIC CHOLECYSTITIS WITHOUT CALCULUS (K81.1)  Diagnostic Studies History (Tamari Busic K. Ronnett Pullin, MD; 03/13/2018 4:45 PM) Colonoscopy within last year Mammogram within last year  Allergies (Rodman KeyK. Hye Trawick, MD; 03/13/2018 4:45  PM) Penicillins Doxycycline *DERMATOLOGICALS* Wellbutrin *ANTIDEPRESSANTS* Chantix *PSYCHOTHERAPEUTIC AND NEUROLOGICAL AGENTS - MISC.*  Medication History (Tanisha A. BOwens Shark RE[place] 03/13/2018 4:27 PM) ALPRAZolam (0.5MG Tablet, Oral) Active. raNITIdine HCl (150MG Tablet, Oral) Active. Sertraline HCl (50MG Tablet, Oral) Active. Medications Reconciled  Pregnancy / Birth History (Rebecca [name] Suleyman Ehrman, MD; 03/13/2018 4:45 PM) Age at menarche 89years. Age of menopause <45 Gravida 2 Maternal age 67-20 Other Problems (Rebecca [name] Corben Auzenne, MD; 03/13/2018 5:59 PM) Anxiety Disorder Arthritis Back Pain Bladder Problems Chronic Obstructive Lung Disease Depression Emphysema Of Lung Gastroesophageal Reflux Disease Hemorrhoids Hypercholesterolemia Migraine Headache Ulcerative Colitis Ventral Hernia Repair    Vitals (Tanisha A. Brown RMA; 03/13/2018 4:27 PM) 03/13/2018 4:26 PM Weight: 145.4 lb Height: 66in Body Surface Area: 1.75 m Body Mass Index: 23.47 kg/m  Temp.: 98.49F  Pulse: 92 (Regular)  P.OX: 94% (Room air) BP: 110/78 (Sitting, Left Arm, Standard)      Physical Exam (Rodman KeyK. Cecil Bixby MD; 03/13/2018 6:00 PM)  The physical exam findings are as follows: Note:WDWN in NAD - smells heavily of tobacco smoke Eyes: Pupils equal, round; sclera anicteric HENT: Oral mucosa moist; Neck: No masses palpated, no thyromegaly Lungs: CTA bilaterally; normal respiratory effort CV: Regular rate and rhythm; no murmurs; extremities well-perfused with no edema Abd: +bowel sounds, soft, tender in RUQ, epigastrium, and suprapubic region, no palpable organomegaly; no palpable hernias Skin: Warm, dry; no sign of jaundice Psychiatric - alert and oriented x 4; calm mood and affect    Assessment & Plan (Rodman KeyK. Pecola Haxton MD; 03/13/2018 5:01 PM)  CHRONIC CHOLECYSTITIS WITHOUT CALCULUS (K81.1)  Current Plans Schedule for Surgery - Laparoscopic cholecystectomy with  intraoperative cholangiogram. The surgical procedure has been discussed with the patient. Potential risks, benefits, alternative treatments, and expected outcomes have been explained. All of the patient's questions at this time have been answered. The likelihood of reaching the patient's treatment goal is good. The patient  understand the proposed surgical procedure and wishes to proceed. Pt Education - Pamphlet Given - Laparoscopic Gallbladder Surgery: discussed with patient and provided information.   Rebecca [name]. Georgette Dover, MD, [facility] Surgery  General/ Trauma Surgery Beeper [phone]  03/13/2018 6:00 PM

## 2018-03-13 NOTE — H&P (View-Only) (Signed)
History of Present Illness Rebecca [name]. Rebecca Branscom MD; 03/13/2018 5:59 PM) The patient is a 67 year old female who presents for evaluation of gall stones. Referred by Dr. Zenovia Jarred for possible ventral hernia  This is a 67 year old female with multiple medical issues who was evaluated by GI in September 2019 with multiple abdominal complaints. She had complaints of pain in her pelvis near her rectum as well as right upper quadrant and epigastric abdominal pain. She occasionally has some left lower quadrant abdominal pain.  The patient has a history of ischemic colitis. Her last colonoscopy was 10 years ago. She also has a history of fibromyalgia, hypertension, anxiety and depression, restless leg syndrome, severe scoliosis with chronic back pain. She has had repair of cystocele and rectocele several years ago. She also had a hysterectomy. At that time she was told that she had a lot of intra-abdominal adhesions. Over the last several months she noticed a change in her bowel movements with smaller pencil thin stools alternating between constipation and diarrhea. She reports no rectal bleeding. She does have a lot of pressure internally in the rectum. She does have some stress urinary incontinence. She also describes significant upper abdominal bloating, pressure, and right upper quadrant abdominal pain. This pain does not seem to be exacerbated by eating. Apparently she was told several years ago that she had a bad gallbladder but she was never referred for cholecystectomy. It is unclear why she was told that she had a "bad gallbladder".  She underwent EGD with dilation as well as colonoscopy on 01/07/18. She had 3 small polyps in the transverse colon and rectum that were benign. She has a small hiatal hernia and had some antral erosions that were not bleeding.  She has been referred to Urology for the cystocele, rectocele. HIDA scan showed a low GB EF. She presents now to discuss her upcoming  surgery. She is apprehensive because her husband had a bile duct injury when he had his cholecystectomy.   CLINICAL DATA: RIGHT-sided abdominal pain. Change in bowel habits. Prior hysterectomy.  EXAM: CT ABDOMEN AND PELVIS WITH CONTRAST  TECHNIQUE: Multidetector CT imaging of the abdomen and pelvis was performed using the standard protocol following bolus administration of intravenous contrast.  CONTRAST: 152m ISOVUE-300 IOPAMIDOL (ISOVUE-300) INJECTION 61%  COMPARISON: CT 03/11/2017  FINDINGS: Lower chest: Lung bases are clear.  Hepatobiliary: No focal hepatic lesion. No biliary duct dilatation. Gallbladder is normal. Common bile duct is normal.  Pancreas: Low-attenuation the pancreatic head is similar to comparison exam 2016 and favored focal fatty infiltration. No pancreatic duct dilatation. No common bile duct dilatation.  Spleen: Normal spleen  Adrenals/urinary tract: Adrenal glands normal. 15 mm simple fluid attenuation lesion in the RIGHT kidney. Small simple fluid attenuation lesions in the LEFT kidney. Ureters and bladder normal.  Stomach/Bowel: Stomach, duodenum, small-bowel are normal. Appendix normal. Ascending, transverse, descending colon normal. Rectosigmoid colon normal.  Vascular/Lymphatic: Abdominal aorta is normal caliber with atherosclerotic calcification. There is no retroperitoneal or periportal lymphadenopathy. No pelvic lymphadenopathy.  Reproductive: Post hysterectomy. No adnexal abnormality.  Other: No free fluid. Ventral hernia.  Musculoskeletal: No aggressive osseous lesion.  IMPRESSION: 1. No acute findings in the abdomen or pelvis. 2. No bowel abnormality. 3. Benign-appearing low-density lesions in the kidneys. 4. Aortic Atherosclerosis (ICD10-I70.0).  Electronically Signed: By: SSuzy BouchardM.D. On: 01/05/2018 12:53   ADDENDUM REPORT: 02/20/2018 11:39  ADDENDUM: Voice recognition error: Findings section: NO  ventral hernia.   Electronically Signed By: SSuzy BouchardM.D. On: 02/20/2018  11:39  CLINICAL DATA: Abdominal pain and bloating  EXAM: NUCLEAR MEDICINE HEPATOBILIARY IMAGING WITH GALLBLADDER EF  VIEWS: Anterior right upper quadrant  RADIOPHARMACEUTICALS: 5.5 mCi Tc-81mCholetec IV  COMPARISON: None.  FINDINGS: Liver uptake of radiotracer is unremarkable. There is prompt visualization of gallbladder and small bowel, indicating patency of the cystic and common bile ducts. Patient consumed 8 ounces of Ensure orally with calculation of the computer generated ejection fraction of radiotracer from the gallbladder. The patient did not experience clinical symptoms with the oral Ensure consumption. There was extensive motion of patient in gallbladder during the ejection fraction calculation. Toward the end of the study, there was less motion. There is thought to be approximately 22% ejection fraction of radiotracer from the gallbladder taking these factors into account, normal greater than 33% using the oral agent.  IMPRESSION: Exact ejection fraction difficult to calculate due to patient in gallbladder motion. It is felt that the ejection fraction is below 33%, an estimated to be closer to 22%. This value is abnormally low and felt to be indicative of a degree of biliary dyskinesia.  Cystic and common bile ducts are patent as is evidenced by visualization of gallbladder and small bowel.   Electronically Signed By: WLowella GripIII M.D. On: 03/05/2018 14:13   Problem List/Past Medical (Rodman KeyK. 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 Nabeel Gladson, MD; 03/13/2018 4:45 PM) Colonoscopy within last year Mammogram within last year  Allergies (Rodman KeyK. Edwin Baines, MD; 03/13/2018 4:45  PM) Penicillins Doxycycline *DERMATOLOGICALS* Wellbutrin *ANTIDEPRESSANTS* Chantix *PSYCHOTHERAPEUTIC AND NEUROLOGICAL AGENTS - MISC.*  Medication History (Tanisha A. BOwens Shark [place] 03/13/2018 4:27 PM) ALPRAZolam (0.5MG Tablet, Oral) Active. raNITIdine HCl (150MG Tablet, Oral) Active. Sertraline HCl (50MG Tablet, Oral) Active. Medications Reconciled  Pregnancy / Birth History (Rebecca [name] Siler Mavis, MD; 03/13/2018 4:45 PM) Age at menarche 89years. Age of menopause <45 Gravida 2 Maternal age 67-20 Other Problems (Rebecca [name] Dante Roudebush, MD; 03/13/2018 5:59 PM) Anxiety Disorder Arthritis Back Pain Bladder Problems Chronic Obstructive Lung Disease Depression Emphysema Of Lung Gastroesophageal Reflux Disease Hemorrhoids Hypercholesterolemia Migraine Headache Ulcerative Colitis Ventral Hernia Repair    Vitals (Tanisha A. Brown RMA; 03/13/2018 4:27 PM) 03/13/2018 4:26 PM Weight: 145.4 lb Height: 66in Body Surface Area: 1.75 m Body Mass Index: 23.47 kg/m  Temp.: 98.48F  Pulse: 92 (Regular)  P.OX: 94% (Room air) BP: 110/78 (Sitting, Left Arm, Standard)      Physical Exam (Rodman KeyK. Chyann Ambrocio MD; 03/13/2018 6:00 PM)  The physical exam findings are as follows: Note:WDWN in NAD - smells heavily of tobacco smoke Eyes: Pupils equal, round; sclera anicteric HENT: Oral mucosa moist; Neck: No masses palpated, no thyromegaly Lungs: CTA bilaterally; normal respiratory effort CV: Regular rate and rhythm; no murmurs; extremities well-perfused with no edema Abd: +bowel sounds, soft, tender in RUQ, epigastrium, and suprapubic region, no palpable organomegaly; no palpable hernias Skin: Warm, dry; no sign of jaundice Psychiatric - alert and oriented x 4; calm mood and affect    Assessment & Plan (Rodman KeyK. Miniya Miguez MD; 03/13/2018 5:01 PM)  CHRONIC CHOLECYSTITIS WITHOUT CALCULUS (K81.1)  Current Plans Schedule for Surgery - Laparoscopic cholecystectomy with  intraoperative cholangiogram. The surgical procedure has been discussed with the patient. Potential risks, benefits, alternative treatments, and expected outcomes have been explained. All of the patient's questions at this time have been answered. The likelihood of reaching the patient's treatment goal is good. The patient  understand the proposed surgical procedure and wishes to proceed. Pt Education - Pamphlet Given - Laparoscopic Gallbladder Surgery: discussed with patient and provided information.   Rebecca [name]. Georgette Dover, MD, [facility] Surgery  General/ Trauma Surgery Beeper [phone]  03/13/2018 6:00 PM

## 2018-03-21 NOTE — Pre-Procedure Instructions (Signed)
Rebecca [name]  03/21/2018      [id number] [place], [place] - [id number]: [phone]: [phone]   Your procedure is scheduled on March 26, 2018.  [facility]  Call this number if you have problems the morning of surgery:  [phone]   Remember:  Do not eat or drink after midnight.     Take these medicines the morning of surgery with A SIP OF WATER  Tylenol-if needed Alprazolam (xanax)-if needed Flonase nasal spray Montelukast (singulair) Promethazine (phenergan)-if needed Sertraline (zoloft) Ocean nasal spray Anoro Ellipta Inhaler if needed-please bring with you   7 days prior to surgery STOP taking any Aspirin (unless otherwise instructed by your surgeon), Aleve, Naproxen, Ibuprofen, Motrin, Advil, Goody's, BC's, all herbal medications, fish oil, and all vitamins    Do not wear jewelry, make-up or nail polish.  Do not wear lotions, powders, or perfumes, or deodorant.  Do not shave 48 hours prior to surgery.  Men may shave face and neck.  Do not bring valuables to the hospital.  [facility] any belongings or val[facility]ables.  Contacts, dentures or bridgework may not be worn into surgery.  Leave your suitcase in the car.  After surgery it may be brought to your room.  For patients admitted to the hospital, discharge time will be determined by your treatment team.  Patients discharged the day of surgery will not be allowed to drive home.    [place]- Preparing For Surgery  Before surgery, you can play an important role. Because skin is not sterile, your skin needs to be as free of germs as possible. You can reduce the number of germs on your skin by washing with CHG (chlorahexidine gluconate) Soap before surgery.  CHG is an antiseptic cleaner which kills germs and bonds with the skin to continue killing germs even after  washing.    Oral Hygiene is also important to reduce your risk of infection.  Remember - BRUSH YOUR TEETH THE MORNING OF SURGERY WITH YOUR REGULAR TOOTHPASTE  Please do not use if you have an allergy to CHG or antibacterial soaps. If your skin becomes reddened/irritated stop using the CHG.  Do not shave (including legs and underarms) for at least 48 hours prior to first CHG shower. It is OK to shave your face.  Please follow these instructions carefully.   1. Shower the NIGHT BEFORE SURGERY and the MORNING OF SURGERY with CHG.   2. If you chose to wash your hair, wash your hair first as usual with your normal shampoo.  3. After you shampoo, rinse your hair and body thoroughly to remove the shampoo.  4. Use CHG as you would any other liquid soap. You can apply CHG directly to the skin and wash gently with a scrungie or a clean washcloth.   5. Apply the CHG Soap to your body ONLY FROM THE NECK DOWN.  Do not use on open wounds or open sores. Avoid contact with your eyes, ears, mouth and genitals (private parts). Wash Face and genitals (private parts)  with your normal soap.  6. Wash thoroughly, paying special attention to the area where your surgery will be performed.  7. Thoroughly rinse your body with warm water from the neck down.  8. DO NOT shower/wash with your normal soap after using and rinsing off the CHG Soap.  9.  Pat yourself dry with a CLEAN TOWEL.  10. Wear CLEAN PAJAMAS to bed the night before surgery, wear comfortable clothes the morning of surgery  11. Place CLEAN SHEETS on your bed the night of your first shower and DO NOT SLEEP WITH PETS.  Day of Surgery:  Do not apply any deodorants/lotions.  Please wear clean clothes to the hospital/surgery center.   Remember to brush your teeth WITH YOUR REGULAR TOOTHPASTE.   Please read over the following fact sheets that you were given.

## 2018-03-22 ENCOUNTER — Encounter (HOSPITAL_COMMUNITY)
Admission: RE | Admit: 2018-03-22 | Discharge: 2018-03-22 | Disposition: A | Discharge status: Home / Self Care | Payer: Medicare HMO | Source: Ambulatory Visit | Attending: Surgery | Admitting: Surgery

## 2018-03-22 ENCOUNTER — Encounter (HOSPITAL_COMMUNITY): Payer: Self-pay

## 2018-03-22 ENCOUNTER — Other Ambulatory Visit: Payer: Self-pay

## 2018-03-22 DIAGNOSIS — Z01818 Encounter for other preprocedural examination: Secondary | ICD-10-CM | POA: Diagnosis not present

## 2018-03-22 HISTORY — DX: Other complications of anesthesia, initial encounter: T88.59XA

## 2018-03-22 HISTORY — DX: Other specified postprocedural states: Z98.890

## 2018-03-22 HISTORY — DX: Other specified postprocedural states: R11.2

## 2018-03-22 HISTORY — DX: Adverse effect of unspecified anesthetic, initial encounter: T41.45XA

## 2018-03-22 LAB — CBC
HEMATOCRIT: 46.6 % — AB (ref 36.0–46.0)
Hemoglobin: 14.3 g/dL (ref 12.0–15.0)
MCH: 29.7 pg (ref 26.0–34.0)
MCHC: 30.7 g/dL (ref 30.0–36.0)
MCV: 96.9 fL (ref 80.0–100.0)
Platelets: 213 10*3/uL (ref 150–400)
RBC: 4.81 MIL/uL (ref 3.87–5.11)
RDW: 12.9 % (ref 11.5–15.5)
WBC: 8.1 10*3/uL (ref 4.0–10.5)
nRBC: 0 % (ref 0.0–0.2)

## 2018-03-22 LAB — BASIC METABOLIC PANEL
Anion gap: 10 (ref 5–15)
BUN: 7 mg/dL — AB (ref 8–23)
CO2: 29 mmol/L (ref 22–32)
Calcium: 9.8 mg/dL (ref 8.9–10.3)
Chloride: 102 mmol/L (ref 98–111)
Creatinine, Ser: 0.69 mg/dL (ref 0.44–1.00)
GFR calc Af Amer: >60 mL/min (ref >60)
GFR calc non Af Amer: >60 mL/min (ref >60)
Glucose, Bld: 85 mg/dL (ref 70–99)
Potassium: 4.3 mmol/L (ref 3.5–5.1)
Sodium: 141 mmol/L (ref 135–145)

## 2018-03-22 NOTE — Progress Notes (Signed)
PCP - Dr. Leonarda Salon  Cardiologist - Denies  Chest x-ray - Denies  EKG - 03/22/18  Stress Test - 10  Yrs- Neg, per pt  ECHO - Denies  Cardiac Cath - Denies  AICD- na PM- na LOOP- na  Sleep Study - Denies CPAP - None  LABS- 03/22/18: CBC, BMP  ASA- Denies   Anesthesia- No  Pt denies having chest pain, sob, or fever at this time. All instructions explained to the pt, with a verbal understanding of the material. Pt agrees to go over the instructions while at home for a better understanding. The opportunity to ask questions was provided.

## 2018-03-26 ENCOUNTER — Ambulatory Visit (HOSPITAL_COMMUNITY)
Admission: RE | Admit: 2018-03-26 | Discharge: 2018-03-26 | Disposition: A | Discharge status: Home / Self Care | Payer: Medicare HMO | Source: Ambulatory Visit | Attending: Surgery | Admitting: Surgery

## 2018-03-26 ENCOUNTER — Ambulatory Visit (HOSPITAL_COMMUNITY): Payer: Medicare HMO | Admitting: Certified Registered Nurse Anesthetist

## 2018-03-26 ENCOUNTER — Other Ambulatory Visit: Payer: Self-pay

## 2018-03-26 ENCOUNTER — Encounter (HOSPITAL_COMMUNITY): Payer: Self-pay | Admitting: Certified Registered Nurse Anesthetist

## 2018-03-26 ENCOUNTER — Encounter (HOSPITAL_COMMUNITY)
Admission: RE | Disposition: A | Discharge status: Home / Self Care | Payer: Self-pay | Source: Ambulatory Visit | Attending: Surgery

## 2018-03-26 DIAGNOSIS — J449 Chronic obstructive pulmonary disease, unspecified: Secondary | ICD-10-CM | POA: Diagnosis not present

## 2018-03-26 DIAGNOSIS — K811 Chronic cholecystitis: Secondary | ICD-10-CM | POA: Diagnosis not present

## 2018-03-26 DIAGNOSIS — K219 Gastro-esophageal reflux disease without esophagitis: Secondary | ICD-10-CM | POA: Insufficient documentation

## 2018-03-26 DIAGNOSIS — I10 Essential (primary) hypertension: Secondary | ICD-10-CM | POA: Insufficient documentation

## 2018-03-26 DIAGNOSIS — K5190 Ulcerative colitis, unspecified, without complications: Secondary | ICD-10-CM | POA: Insufficient documentation

## 2018-03-26 DIAGNOSIS — I700 Atherosclerosis of aorta: Secondary | ICD-10-CM | POA: Insufficient documentation

## 2018-03-26 DIAGNOSIS — E7800 Pure hypercholesterolemia, unspecified: Secondary | ICD-10-CM | POA: Diagnosis not present

## 2018-03-26 DIAGNOSIS — I2510 Atherosclerotic heart disease of native coronary artery without angina pectoris: Secondary | ICD-10-CM | POA: Insufficient documentation

## 2018-03-26 DIAGNOSIS — G43909 Migraine, unspecified, not intractable, without status migrainosus: Secondary | ICD-10-CM | POA: Diagnosis not present

## 2018-03-26 DIAGNOSIS — F17200 Nicotine dependence, unspecified, uncomplicated: Secondary | ICD-10-CM | POA: Diagnosis not present

## 2018-03-26 DIAGNOSIS — F329 Major depressive disorder, single episode, unspecified: Secondary | ICD-10-CM | POA: Insufficient documentation

## 2018-03-26 DIAGNOSIS — F419 Anxiety disorder, unspecified: Secondary | ICD-10-CM | POA: Insufficient documentation

## 2018-03-26 DIAGNOSIS — Z79899 Other long term (current) drug therapy: Secondary | ICD-10-CM | POA: Diagnosis not present

## 2018-03-26 HISTORY — PX: CHOLECYSTECTOMY: SHX55

## 2018-03-26 SURGERY — LAPAROSCOPIC CHOLECYSTECTOMY WITH INTRAOPERATIVE CHOLANGIOGRAM
Anesthesia: General | Site: Abdomen

## 2018-03-26 MED ORDER — IOPAMIDOL (ISOVUE-300) INJECTION 61%
INTRAVENOUS | Status: AC
  Filled 2018-03-26: qty 50

## 2018-03-26 MED ORDER — MEPERIDINE HCL 50 MG/ML IJ SOLN: 6.2500 mg | INTRAMUSCULAR | Status: DC | PRN

## 2018-03-26 MED ORDER — FENTANYL CITRATE (PF) 100 MCG/2ML IJ SOLN
INTRAMUSCULAR | Status: DC | PRN
  Administered 2018-03-26: 100 ug via INTRAVENOUS
  Administered 2018-03-26: 50 ug via INTRAVENOUS
  Administered 2018-03-26: 100 ug via INTRAVENOUS

## 2018-03-26 MED ORDER — CHLORHEXIDINE GLUCONATE CLOTH 2 % EX PADS: 6.0000 | MEDICATED_PAD | Freq: Once | CUTANEOUS | Status: DC

## 2018-03-26 MED ORDER — HEMOSTATIC AGENTS (NO CHARGE) OPTIME
TOPICAL | Status: DC | PRN
  Administered 2018-03-26: 1 via TOPICAL

## 2018-03-26 MED ORDER — BUPIVACAINE-EPINEPHRINE 0.25% -1:200000 IJ SOLN
INTRAMUSCULAR | Status: DC | PRN
  Administered 2018-03-26: 15 mL

## 2018-03-26 MED ORDER — ESMOLOL HCL 100 MG/10ML IV SOLN
INTRAVENOUS | Status: AC
  Filled 2018-03-26: qty 10

## 2018-03-26 MED ORDER — CIPROFLOXACIN IN D5W 400 MG/200ML IV SOLN
400.0000 mg | INTRAVENOUS | Status: AC
  Administered 2018-03-26: 400 mg via INTRAVENOUS
  Filled 2018-03-26: qty 200

## 2018-03-26 MED ORDER — METOCLOPRAMIDE HCL 5 MG/ML IJ SOLN: 10.0000 mg | Freq: Once | INTRAMUSCULAR | Status: DC | PRN

## 2018-03-26 MED ORDER — MIDAZOLAM HCL 5 MG/5ML IJ SOLN
INTRAMUSCULAR | Status: DC | PRN
  Administered 2018-03-26: 1 mg via INTRAVENOUS

## 2018-03-26 MED ORDER — HYDROCODONE-ACETAMINOPHEN 5-325 MG PO TABS: 1.0000 | ORAL_TABLET | Freq: Four times a day (QID) | ORAL | 0 refills | Status: DC | PRN

## 2018-03-26 MED ORDER — SUGAMMADEX SODIUM 200 MG/2ML IV SOLN
INTRAVENOUS | Status: DC | PRN
  Administered 2018-03-26: 150 mg via INTRAVENOUS

## 2018-03-26 MED ORDER — PHENYLEPHRINE 40 MCG/ML (10ML) SYRINGE FOR IV PUSH (FOR BLOOD PRESSURE SUPPORT)
PREFILLED_SYRINGE | INTRAVENOUS | Status: AC
  Filled 2018-03-26: qty 10

## 2018-03-26 MED ORDER — ROCURONIUM BROMIDE 50 MG/5ML IV SOSY
PREFILLED_SYRINGE | INTRAVENOUS | Status: DC | PRN
  Administered 2018-03-26: 50 mg via INTRAVENOUS

## 2018-03-26 MED ORDER — MIDAZOLAM HCL 2 MG/2ML IJ SOLN
INTRAMUSCULAR | Status: AC
  Filled 2018-03-26: qty 2

## 2018-03-26 MED ORDER — ONDANSETRON HCL 4 MG/2ML IJ SOLN
INTRAMUSCULAR | Status: AC
  Filled 2018-03-26: qty 2

## 2018-03-26 MED ORDER — STERILE WATER FOR IRRIGATION IR SOLN
Status: DC | PRN
  Administered 2018-03-26: 1000 mL

## 2018-03-26 MED ORDER — 0.9 % SODIUM CHLORIDE (POUR BTL) OPTIME
TOPICAL | Status: DC | PRN
  Administered 2018-03-26: 1000 mL

## 2018-03-26 MED ORDER — LIDOCAINE 2% (20 MG/ML) 5 ML SYRINGE
INTRAMUSCULAR | Status: DC | PRN
  Administered 2018-03-26: 60 mg via INTRAVENOUS

## 2018-03-26 MED ORDER — HYDROMORPHONE HCL 1 MG/ML IJ SOLN: 0.2500 mg | INTRAMUSCULAR | Status: DC | PRN

## 2018-03-26 MED ORDER — SODIUM CHLORIDE 0.9 % IV SOLN
INTRAVENOUS | Status: DC | PRN
  Administered 2018-03-26: 100 mL

## 2018-03-26 MED ORDER — PROPOFOL 10 MG/ML IV BOLUS
INTRAVENOUS | Status: AC
  Filled 2018-03-26: qty 20

## 2018-03-26 MED ORDER — FENTANYL CITRATE (PF) 250 MCG/5ML IJ SOLN
INTRAMUSCULAR | Status: AC
  Filled 2018-03-26: qty 5

## 2018-03-26 MED ORDER — ONDANSETRON HCL 4 MG/2ML IJ SOLN
INTRAMUSCULAR | Status: DC | PRN
  Administered 2018-03-26: 4 mg via INTRAVENOUS

## 2018-03-26 MED ORDER — SODIUM CHLORIDE 0.9 % IR SOLN
Status: DC | PRN
  Administered 2018-03-26: 1000 mL

## 2018-03-26 MED ORDER — PROPOFOL 10 MG/ML IV BOLUS
INTRAVENOUS | Status: DC | PRN
  Administered 2018-03-26: 120 mg via INTRAVENOUS

## 2018-03-26 MED ORDER — DEXAMETHASONE SODIUM PHOSPHATE 10 MG/ML IJ SOLN
INTRAMUSCULAR | Status: DC | PRN
  Administered 2018-03-26: 10 mg via INTRAVENOUS

## 2018-03-26 MED ORDER — LACTATED RINGERS IV SOLN
INTRAVENOUS | Status: DC
  Administered 2018-03-26 (×2): via INTRAVENOUS

## 2018-03-26 MED ORDER — ESMOLOL HCL 100 MG/10ML IV SOLN
INTRAVENOUS | Status: DC | PRN
  Administered 2018-03-26: 20 mg via INTRAVENOUS

## 2018-03-26 MED ORDER — BUPIVACAINE-EPINEPHRINE (PF) 0.25% -1:200000 IJ SOLN
INTRAMUSCULAR | Status: AC
  Filled 2018-03-26: qty 30

## 2018-03-26 SURGICAL SUPPLY — 50 items
APL SKNCLS STERI-STRIP NONHPOA (GAUZE/BANDAGES/DRESSINGS) ×1
APPLIER CLIP ROT 10 11.4 M/L (STAPLE) ×2
APR CLP MED LRG 11.4X10 (STAPLE) ×1
BAG SPEC RTRVL 10 TROC 200 (ENDOMECHANICALS)
BAG SPEC RTRVL LRG 6X4 10 (ENDOMECHANICALS) ×1
BENZOIN TINCTURE PRP APPL 2/3 (GAUZE/BANDAGES/DRESSINGS) ×2 IMPLANT
CANISTER SUCT 3000ML PPV (MISCELLANEOUS) ×2 IMPLANT
CHLORAPREP W/TINT 26ML (MISCELLANEOUS) ×2 IMPLANT
CLIP APPLIE ROT 10 11.4 M/L (STAPLE) ×1 IMPLANT
COVER MAYO STAND STRL (DRAPES) ×2 IMPLANT
COVER SURGICAL LIGHT HANDLE (MISCELLANEOUS) ×2 IMPLANT
COVER WAND RF STERILE (DRAPES) ×2 IMPLANT
DRAPE C-ARM 42X72 X-RAY (DRAPES) ×2 IMPLANT
DRSG TEGADERM 2-3/8X2-3/4 SM (GAUZE/BANDAGES/DRESSINGS) ×4 IMPLANT
DRSG TEGADERM 4X4.75 (GAUZE/BANDAGES/DRESSINGS) ×2 IMPLANT
ELECT REM PT RETURN 9FT ADLT (ELECTROSURGICAL) ×2
ELECTRODE REM PT RTRN 9FT ADLT (ELECTROSURGICAL) ×1 IMPLANT
FILTER SMOKE EVAC LAPAROSHD (FILTER) ×2 IMPLANT
GAUZE SPONGE 2X2 8PLY STRL LF (GAUZE/BANDAGES/DRESSINGS) ×1 IMPLANT
GLOVE BIO SURGEON STRL SZ7 (GLOVE) ×2 IMPLANT
GLOVE BIOGEL PI IND STRL 6.5 (GLOVE) IMPLANT
GLOVE BIOGEL PI IND STRL 7.5 (GLOVE) ×1 IMPLANT
GLOVE BIOGEL PI INDICATOR 6.5 (GLOVE) ×1
GLOVE BIOGEL PI INDICATOR 7.5 (GLOVE) ×2
GLOVE ECLIPSE 7.5 STRL STRAW (GLOVE) ×1 IMPLANT
GLOVE SURG SS PI 6.0 STRL IVOR (GLOVE) ×1 IMPLANT
GOWN STRL REUS W/ TWL LRG LVL3 (GOWN DISPOSABLE) ×3 IMPLANT
GOWN STRL REUS W/TWL LRG LVL3 (GOWN DISPOSABLE) ×6
HEMOSTAT SNOW SURGICEL 2X4 (HEMOSTASIS) ×1 IMPLANT
KIT BASIN OR (CUSTOM PROCEDURE TRAY) ×2 IMPLANT
KIT TURNOVER KIT B (KITS) ×2 IMPLANT
NS IRRIG 1000ML POUR BTL (IV SOLUTION) ×2 IMPLANT
PAD ARMBOARD 7.5X6 YLW CONV (MISCELLANEOUS) ×2 IMPLANT
POUCH RETRIEVAL ECOSAC 10 (ENDOMECHANICALS) IMPLANT
POUCH RETRIEVAL ECOSAC 10MM (ENDOMECHANICALS)
POUCH SPECIMEN RETRIEVAL 10MM (ENDOMECHANICALS) ×1 IMPLANT
SCISSORS LAP 5X35 DISP (ENDOMECHANICALS) ×2 IMPLANT
SET CHOLANGIOGRAPH 5 50 .035 (SET/KITS/TRAYS/PACK) ×2 IMPLANT
SET IRRIG TUBING LAPAROSCOPIC (IRRIGATION / IRRIGATOR) ×2 IMPLANT
SLEEVE ENDOPATH XCEL 5M (ENDOMECHANICALS) ×2 IMPLANT
SPECIMEN JAR SMALL (MISCELLANEOUS) ×2 IMPLANT
SPONGE GAUZE 2X2 STER 10/PKG (GAUZE/BANDAGES/DRESSINGS) ×1
STRIP CLOSURE SKIN 1/2X4 (GAUZE/BANDAGES/DRESSINGS) ×2 IMPLANT
SUT MNCRL AB 4-0 PS2 18 (SUTURE) ×2 IMPLANT
TRAY LAPAROSCOPIC MC (CUSTOM PROCEDURE TRAY) ×2 IMPLANT
TROCAR XCEL BLUNT TIP 100MML (ENDOMECHANICALS) ×2 IMPLANT
TROCAR XCEL NON-BLD 11X100MML (ENDOMECHANICALS) ×2 IMPLANT
TROCAR XCEL NON-BLD 5MMX100MML (ENDOMECHANICALS) ×2 IMPLANT
TUBING INSUFFLATION (TUBING) ×2 IMPLANT
WATER STERILE IRR 1000ML POUR (IV SOLUTION) ×2 IMPLANT

## 2018-03-26 NOTE — Anesthesia Preprocedure Evaluation (Addendum)
Anesthesia Evaluation  Patient identified by MRN, date of birth, ID band Patient awake    Reviewed: Allergy & Precautions, NPO status , Patient's Chart, lab work & pertinent test results, reviewed documented beta blocker date and time   History of Anesthesia Complications (+) PONV and history of anesthetic complications  Airway Mallampati: I  TM Distance: >3 FB Neck ROM: Full    Dental  (+) Lower Dentures, Upper Dentures   Pulmonary pneumonia, resolved, COPD,  COPD inhaler, Current Smoker,    Pulmonary exam normal breath sounds clear to auscultation       Cardiovascular hypertension, Pt. on medications + CAD and + Past MI  Normal cardiovascular exam Rhythm:Regular Rate:Normal     Neuro/Psych  Headaches, PSYCHIATRIC DISORDERS Anxiety Depression Peripheral neuropathy  Neuromuscular disease CVA    GI/Hepatic hiatal hernia, GERD  Medicated and Controlled,Biliary dyskinesia Cholelithiasis with chronic cholecystitis   Endo/Other  negative endocrine ROS  Renal/GU Renal disease  negative genitourinary   Musculoskeletal  (+) Arthritis , Osteoarthritis,  Fibromyalgia -  Abdominal   Peds  Hematology negative hematology ROS (+)   Anesthesia Other Findings   Reproductive/Obstetrics                            Anesthesia Physical Anesthesia Plan  ASA: III  Anesthesia Plan: General   Post-op Pain Management:    Induction: Intravenous, Cricoid pressure planned and Rapid sequence  PONV Risk Score and Plan: 4 or greater and Ondansetron, Dexamethasone and Treatment may vary due to age or medical condition  Airway Management Planned: Oral ETT  Additional Equipment:   Intra-op Plan:   Post-operative Plan: Extubation in OR  Informed Consent: I have reviewed the patients History and Physical, chart, labs and discussed the procedure including the risks, benefits and alternatives for the proposed  anesthesia with the patient or authorized representative who has indicated his/her understanding and acceptance.   Dental advisory given  Plan Discussed with: CRNA and Surgeon  Anesthesia Plan Comments:         Anesthesia Quick Evaluation

## 2018-03-26 NOTE — Progress Notes (Signed)
Dr. Gershon Crane came by to see patient regarding her slurred speech. Patient back on oxygen at this time, MD okay with patient going home once she can get off the oxygen. Oxygen stopped at this time, will continue to monitor patient.

## 2018-03-26 NOTE — Interval H&P Note (Signed)
History and Physical Interval Note:  03/26/2018 11:38 AM  Rebecca [name]  has presented today for surgery, with the diagnosis of CHRONIC CHOLECYSTITIS  The various methods of treatment have been discussed with the patient and family. After consideration of risks, benefits and other options for treatment, the patient has consented to  Procedure(s): LAPAROSCOPIC CHOLECYSTECTOMY WITH INTRAOPERATIVE CHOLANGIOGRAM (N/A) as a surgical intervention .  The patient's history has been reviewed, patient examined, no change in status, stable for surgery.  I have reviewed the patient's chart and labs.  Questions were answered to the patient's satisfaction.     Maia Petties

## 2018-03-26 NOTE — Anesthesia Postprocedure Evaluation (Signed)
Anesthesia Post Note  Patient: Rebecca [name]  Procedure(s) Performed: LAPAROSCOPIC CHOLECYSTECTOMY WITH ATTEMPTED INTRAOPERATIVE CHOLANGIOGRAM (N/A Abdomen)     Patient location during evaluation: PACU Anesthesia Type: General Level of consciousness: awake and alert and oriented Pain management: pain level controlled Vital Signs Assessment: post-procedure vital signs reviewed and stable Respiratory status: spontaneous breathing, nonlabored ventilation and respiratory function stable Cardiovascular status: blood pressure returned to baseline and stable Postop Assessment: no apparent nausea or vomiting Anesthetic complications: no    Last Vitals:  Vitals:   03/26/18 1503 03/26/18 1518  BP: 115/77 (!) 125/56  Pulse: 85 81  Resp: (!) 22 13  Temp:    SpO2: 95% 98%    Last Pain:  Vitals:   03/26/18 1350  TempSrc:   PainSc: Asleep                 Deidre Carino A.

## 2018-03-26 NOTE — Progress Notes (Signed)
Patient has been having slurred speech and hesitancy finding words since arrival to recovery. Husband states that patient has had these issues before following a prior surgery and that they resolved once she arrived home. Dr. Nyoka Cowden with anesthesia notified and came by to see patient, wanted Dr. Gershon Crane to be notified. Dr. Nyoka Cowden and Dr. Gershon Crane discussed patient status. Dr. Gershon Crane will come by and see patient in about an hour to determine what is the best course of action for patient. Will continue to monitor patient.

## 2018-03-26 NOTE — Anesthesia Procedure Notes (Signed)
Procedure Name: Intubation Date/Time: 03/26/2018 12:43 PM Performed by: Inda Coke, CRNA Pre-anesthesia Checklist: Patient identified, Emergency Drugs available, Suction available and Patient being monitored Patient Re-evaluated:Patient Re-evaluated prior to induction Oxygen Delivery Method: Circle System Utilized Preoxygenation: Pre-oxygenation with 100% oxygen Induction Type: IV induction Ventilation: Mask ventilation without difficulty Laryngoscope Size: Mac and 4 Grade View: Grade I Tube type: Oral Number of attempts: 1 Airway Equipment and Method: Stylet and Oral airway Placement Confirmation: ETT inserted through vocal cords under direct vision,  positive ETCO2 and breath sounds checked- equal and bilateral Secured at: 21 cm Tube secured with: Tape Dental Injury: Teeth and Oropharynx as per pre-operative assessment

## 2018-03-26 NOTE — Transfer of Care (Signed)
Immediate Anesthesia Transfer of Care Note  Patient: Rebecca [name]  Procedure(s) Performed: LAPAROSCOPIC CHOLECYSTECTOMY WITH ATTEMPTED INTRAOPERATIVE CHOLANGIOGRAM (N/A Abdomen)  Patient Location: PACU and NICU  Anesthesia Type:General  Level of Consciousness: awake, alert  and oriented  Airway & Oxygen Therapy: Patient Spontanous Breathing and Patient connected to nasal cannula oxygen  Post-op Assessment: Report given to RN and Post -op Vital signs reviewed and stable  Post vital signs: Reviewed and stable  Last Vitals:  Vitals Value Taken Time  BP 144/102 03/26/2018  1:50 PM  Temp 36.5 C 03/26/2018  1:50 PM  Pulse 92 03/26/2018  1:55 PM  Resp 18 03/26/2018  1:55 PM  SpO2 96 % 03/26/2018  1:55 PM  Vitals shown include unvalidated device data.  Last Pain:  Vitals:   03/26/18 1350  TempSrc:   PainSc: Asleep         Complications: No apparent anesthesia complications

## 2018-03-26 NOTE — Op Note (Signed)
Laparoscopic Cholecystectomy Procedure Note  Indications: This patient presents with symptomatic gallbladder disease with a decreased gallbladder ejection fraction and will undergo laparoscopic cholecystectomy.  Pre-operative Diagnosis: Chronic cholecystitis  Post-operative Diagnosis: Same  Surgeon: Maia Petties   Assistants: Judyann Munson, RNFA  Anesthesia: General endotracheal anesthesia  ASA Class: 1  Procedure Details  The patient was seen again in the Holding Room. The risks, benefits, complications, treatment options, and expected outcomes were discussed with the patient. The possibilities of reaction to medication, pulmonary aspiration, perforation of viscus, bleeding, recurrent infection, finding a normal gallbladder, the need for additional procedures, failure to diagnose a condition, the possible need to convert to an open procedure, and creating a complication requiring transfusion or operation were discussed with the patient. The likelihood of improving the patient's symptoms with return to their baseline status is good.  The patient and/or family concurred with the proposed plan, giving informed consent. The site of surgery properly noted. The patient was taken to Operating Room, identified as REN GRASSE and the procedure verified as Laparoscopic Cholecystectomy with Intraoperative Cholangiogram. A Time Out was held and the above information confirmed.  Prior to the induction of general anesthesia, antibiotic prophylaxis was administered. General endotracheal anesthesia was then administered and tolerated well. After the induction, the abdomen was prepped with Chloraprep and draped in sterile fashion. The patient was positioned in the supine position.  Local anesthetic agent was injected into the skin near the umbilicus and an incision made. We dissected down to the abdominal fascia with blunt dissection.  The fascia was incised vertically and we entered the peritoneal  cavity bluntly.  A pursestring suture of 0-Vicryl was placed around the fascial opening.  The Hasson cannula was inserted and secured with the stay suture.  Pneumoperitoneum was then created with CO2 and tolerated well without any adverse changes in the patient's vital signs. An 11-mm port was placed in the subxiphoid position.  Two 5-mm ports were placed in the right upper quadrant. All skin incisions were infiltrated with a local anesthetic agent before making the incision and placing the trocars.   We positioned the patient in reverse Trendelenburg, tilted slightly to the patient's left.  The gallbladder was identified, the fundus grasped and retracted cephalad. There were some flimsy adhesions to the hilum of the gallbladder.  Adhesions were lysed bluntly and with the electrocautery where indicated, taking care not to injure any adjacent organs or viscus. The infundibulum was grasped and retracted laterally, exposing the peritoneum overlying the triangle of Calot. This was then divided and exposed in a blunt fashion. The cystic duct was clearly identified and bluntly dissected circumferentially. A critical view of the cystic duct and cystic artery was obtained.  The cystic duct is very thin in diameter and appears smaller than the cholangiogram catheter, so we did not attempt cholangiogram.  The cystic duct was then ligated with clips and divided. The cystic artery was, dissected free, ligated with clips and divided as well.   The gallbladder was dissected from the liver bed in retrograde fashion with the electrocautery. The gallbladder was removed and placed in an Endocatch sac. The liver bed was irrigated and inspected. Hemostasis was achieved with the electrocautery and SNOW. Copious irrigation was utilized and was repeatedly aspirated until clear.  The gallbladder and Endocatch sac were then removed through the umbilical port site.  The pursestring suture was used to close the umbilical fascia.    We  again inspected the right upper quadrant for hemostasis.  Pneumoperitoneum was released as we removed the trocars.  4-0 Monocryl was used to close the skin.   Benzoin, steri-strips, and clean dressings were applied. The patient was then extubated and brought to the recovery room in stable condition. Instrument, sponge, and needle counts were correct at closure and at the conclusion of the case.   Findings: Cholecystitis without Cholelithiasis  Estimated Blood Loss: less than 50 mL         Drains: none         Specimens: Gallbladder           Complications: None; patient tolerated the procedure well.         Disposition: PACU - hemodynamically stable.         Condition: stable  Imogene Burn. Georgette Dover, MD, [facility]  General/ Trauma Surgery Beeper [phone]  03/26/2018 1:27 PM

## 2018-03-26 NOTE — Discharge Instructions (Signed)
[facility] LAPAROSCOPIC SURGERY: POST OP INSTRUCTIONS Always review your discharge instruction sheet given to you by the facility where your surgery was performed. IF YOU HAVE DISABILITY OR FAMILY LEAVE FORMS, YOU MUST BRING THEM TO THE OFFICE FOR PROCESSING.   DO NOT GIVE THEM TO YOUR DOCTOR.  1. A prescription for pain medication will be given to you upon discharge.  Take your pain medication as prescribed, if needed.  If narcotic pain medicine is not needed, then you may take acetaminophen (Tylenol) or ibuprofen (Advil) as needed. 2. Take your usually prescribed medications unless otherwise directed. 3. If you need a refill on your pain medication, please contact your pharmacy.  They will contact our office to request authorization. Prescriptions will not be filled after 5pm or on week-ends. 4. You should follow a light diet the first few days after arrival home, such as soup and crackers, etc.  Be sure to include lots of fluids daily. 5. Most patients will experience some swelling and bruising in the area of the incisions.  Ice packs will help.  Swelling and bruising can take several days to resolve.  6. It is common to experience some constipation if taking pain medication after surgery.  Increasing fluid intake and taking a stool softener (such as Colace) will usually help or prevent this problem from occurring.  A mild laxative (Milk of Magnesia or Miralax) should be taken according to package instructions if there are no bowel movements after 48 hours. 7. Unless discharge instructions indicate otherwise, you may remove your bandages 48 hours after surgery, and you may shower at that time.  You will have steri-strips (small skin tapes) in place directly over the incision.  These strips should be left on the skin for 7-10 days.  If your surgeon used skin glue on the incision, you may shower in 24 hours.  The glue will flake off over the next 2-3 weeks.  Any sutures or staples  will be removed at the office during your follow-up visit. 8. ACTIVITIES:  You may resume regular (light) daily activities beginning the next day--such as daily self-care, walking, climbing stairs--gradually increasing activities as tolerated.  You may have sexual intercourse when it is comfortable.  Refrain from any heavy lifting or straining until approved by your doctor. a. You may drive when you are no longer taking prescription pain medication, you can comfortably wear a seatbelt, and you can safely maneuver your car and apply brakes. b. RETURN TO WORK:   2-3 weeks 9. You should see your doctor in the office for a follow-up appointment approximately 2-3 weeks after your surgery.  Make sure that you call for this appointment within a day or two after you arrive home to insure a convenient appointment time. 10. OTHER INSTRUCTIONS: ________________________________________________________________________ WHEN TO CALL YOUR DOCTOR: 1. Fever over 101.0 2. Inability to urinate 3. Continued bleeding from incision. 4. Increased pain, redness, or drainage from the incision. 5. Increasing abdominal pain  The clinic staff is available to answer your questions during regular business hours.  Please dont hesitate to call and ask to speak to one of the nurses for clinical concerns.  If you have a medical emergency, go to the nearest emergency room or call 911.  A surgeon from [facility] is always on call at the hospital. [address], [place], [place], [place]  [address] ? [address], [place], [place]   [address] [phone] ? [phone] ? FAX [phone] Web site:  [email or web address]

## 2018-03-27 ENCOUNTER — Encounter (HOSPITAL_COMMUNITY): Payer: Self-pay | Admitting: Surgery

## 2018-05-21 ENCOUNTER — Ambulatory Visit (INDEPENDENT_AMBULATORY_CARE_PROVIDER_SITE_OTHER): Payer: Medicare HMO | Admitting: Psychiatry

## 2018-05-21 ENCOUNTER — Encounter (HOSPITAL_COMMUNITY): Payer: Self-pay | Admitting: Psychiatry

## 2018-05-21 VITALS — BP 119/75 | HR 85 | Ht 64.5 in | Wt 143.0 lb

## 2018-05-21 DIAGNOSIS — F331 Major depressive disorder, recurrent, moderate: Secondary | ICD-10-CM | POA: Diagnosis not present

## 2018-05-21 MED ORDER — SERTRALINE HCL 50 MG PO TABS: 50.0000 mg | ORAL_TABLET | Freq: Two times a day (BID) | ORAL | 2 refills | Status: DC

## 2018-05-21 MED ORDER — ALPRAZOLAM 1 MG PO TABS: ORAL_TABLET | ORAL | 2 refills | Status: DC

## 2018-05-21 NOTE — Progress Notes (Signed)
[facility] MD/PA/NP OP Progress Note  05/21/2018 1:35 PM Rebecca [name]  MRN:  [id number]  Chief Complaint:  Chief Complaint    Depression; Anxiety; Follow-up     HPI: This patient is a 68 year old who lives with her husband in [place].  She is a retired [employer] manager.  The patient returns today for follow-up regarding depression and anxiety.  She had cholecystectomy in December and states that she feels a lot better.  She is no longer having the pain and bloating in her stomach.  She is able to get out and do more things.  She denies any current symptoms of depression anxiety or difficulty sleeping.  Her energy is picking up a little by little.  She denies any thoughts of suicide or self-harm. Visit Diagnosis:    ICD-10-CM   1. Major depressive disorder, recurrent episode, moderate (HCC) F33.1     Past Psychiatric History: 2 prior psychiatric hospitalizations for depression  Past Medical History:  Past Medical History:  Diagnosis Date  . Allergy   . Amenia   . Anxiety   . Arthritis   . Chronic back pain   . Chronic kidney disease    cyst on bil kidneys  . Colitis, ischemic ([place])   . Colon polyp   . Complication of anesthesia   . COPD (chronic obstructive pulmonary disease) ([place])   . Coronary artery disease   . Depression   . Dysphagia   . Fibromyalgia   . GERD (gastroesophageal reflux disease)   . Hiatal hernia   . Hyperlipidemia   . Hypertension   . IBS (irritable bowel syndrome)   . Myocardial infarction ([place])   . 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 Peripheral neuropathy   . Pneumonia   . PONV (postoperative nausea and vomiting)   . Schatzki's ring   . Sciatica   . Sensorineural hearing loss   . Stroke ([place])    T.I. Colitis  . Tinnitus   . UTI (urinary tract infection)     Past Surgical History:  Procedure Laterality Date  . ABDOMINAL HYSTERECTOMY    . BACK SURGERY    . BLADDER SURGERY    . BREAST SURGERY    . carple tunnal  surgery     Right hand   . CHOLECYSTECTOMY N/A 03/26/2018   Procedure: LAPAROSCOPIC CHOLECYSTECTOMY WITH ATTEMPTED INTRAOPERATIVE CHOLANGIOGRAM;  Surgeon: Donnie Mesa, MD;  Location: [facility];  Service: General;  Laterality: N/A;  . COLONOSCOPY  09/10/2005   in [place]  . DILATION AND CURETTAGE OF UTERUS    . NECK SURGERY     cyst on neck  . pillonidal cyst     resection  . TUBAL LIGATION    . UPPER GI ENDOSCOPY  05/05/2011    Family Psychiatric History: see below  Family History:  Family History  Problem Relation Age of Onset  . Alcohol abuse Father   . Depression Father   . Cancer Father        lung  . Cancer Paternal Grandfather        lung  . Osteoporosis Maternal Grandmother   . Kyphosis Maternal Grandmother   . Stroke Maternal Grandfather   . Osteoporosis Mother   . Kyphosis Mother   . Stroke Mother   . Heart disease Mother   . Kidney disease Mother   . Sleep apnea Mother        said used oxygen at night   . Arthritis Brother   . Arthritis Brother   .  Scoliosis Daughter   . Scoliosis Son   . Colon cancer Maternal Uncle   . Diabetes Maternal Uncle   . Irritable bowel syndrome Maternal Uncle   . Esophageal cancer Neg Hx   . Stomach cancer Neg Hx     Social History:  Social History   Socioeconomic History  . Marital status: Married    Spouse name: Not on file  . Number of children: 2  . Years of education: Not on file  . Highest education level: Not on file  Occupational History  . Occupation: CNA  Social Needs  . Financial resource strain: Not on file  . Food insecurity:    Worry: Not on file    Inability: Not on file  . Transportation needs:    Medical: Not on file    Non-medical: Not on file  Tobacco Use  . Smoking status: Current Every Day Smoker    Packs/day: 1.00    Years: 44.00    Pack years: 44.00    Types: Cigarettes  . Smokeless tobacco: Never Used  . Tobacco comment: smokes 5 cig daily  Substance and Sexual Activity  . Alcohol use: No    Alcohol/week: 0.0  standard drinks  . Drug use: No  . Sexual activity: Yes    Birth control/protection: Surgical    Comment: hyst  Lifestyle  . Physical activity:    Days per week: Not on file    Minutes per session: Not on file  . Stress: Not on file  Relationships  . Social connections:    Talks on phone: Not on file    Gets together: Not on file    Attends religious service: Not on file    Active member of club or organization: Not on file    Attends meetings of clubs or organizations: Not on file    Relationship status: Not on file  Other Topics Concern  . Not on file  Social History Narrative  . Not on file    Allergies:  Allergies  Allergen Reactions  . Bupropion Hcl Swelling  . Doxycycline Other (See Comments)    When  she is on Valium   . Simvastatin Other (See Comments)    Sleepy and weak  . Toradol [Ketorolac Tromethamine] Other (See Comments)    Causes gi bleeding    . Cephalexin Other (See Comments)    Burning with urination  . Darvocet [Propoxyphene N-Acetaminophen] Anxiety  . Penicillins Swelling and Rash    Has patient had a PCN reaction causing immediate rash, facial/tongue/throat swelling, SOB or lightheadedness with hypotension: No Has patient had a PCN reaction causing severe rash involving mucus membranes or skin necrosis: No Has patient had a PCN reaction that required hospitalization: No Has patient had a PCN reaction occurring within the last 10 years: No If all of the above answers are "NO", then may proceed with Cephalosporin use.     Metabolic Disorder Labs: No results found for: HGBA1C, MPG No results found for: PROLACTIN Lab Results  Component Value Date   CHOL 235 (H) 08/28/2008   TRIG 147 08/28/2008   HDL 52 08/28/2008   CHOLHDL 4.5 Ratio 08/28/2008   VLDL 29 08/28/2008   LDLCALC 154 (H) 08/28/2008   Lab Results  Component Value Date   TSH 1.511 08/28/2008   TSH 0.802 08/16/2007    Therapeutic Level Labs: No results found for: LITHIUM No  results found for: VALPROATE No components found for:  CBMZ  Current Medications: Current Outpatient Medications  Medication Sig Dispense Refill  . acetaminophen (TYLENOL) 500 MG tablet Take 500 mg by mouth every 6 (six) hours as needed for moderate pain.    Marland Kitchen albuterol (PROVENTIL) (2.5 MG/3ML) 0.083% nebulizer solution Take 2.5 mg by nebulization every 6 (six) hours as needed for wheezing or shortness of breath.    . ALPRAZolam (XANAX) 1 MG tablet Take one half tablet 4 times daily 60 tablet 2  . betamethasone dipropionate (DIPROLENE) 0.05 % cream Apply 1 application topically 2 (two) times daily as needed (rash).     . conjugated estrogens (PREMARIN) vaginal cream Place 1 Applicatorful vaginally daily.     . Cyanocobalamin (VITAMIN B-12 IJ) Inject 1,000 mcg as directed every 30 (thirty) days.     . fluticasone (FLONASE) 50 MCG/ACT nasal spray Place 1 spray into both nostrils daily.     Marland Kitchen HYDROcodone-acetaminophen (NORCO/VICODIN) 5-325 MG tablet Take 1 tablet by mouth every 6 (six) hours as needed for moderate pain. 20 tablet 0  . montelukast (SINGULAIR) 10 MG tablet Take 10 mg by mouth daily as needed (allergies).     . nicotine (NICODERM CQ - DOSED IN MG/24 HR) 7 mg/24hr patch Place 7 mg onto the skin daily.    . promethazine (PHENERGAN) 25 MG tablet Take 25 mg by mouth every 6 (six) hours as needed for nausea or vomiting.    . ranitidine (ZANTAC) 150 MG tablet Take 150 mg by mouth at bedtime.  5  . sertraline (ZOLOFT) 50 MG tablet Take 1 tablet (50 mg total) by mouth 2 (two) times daily. 180 tablet 2  . sodium chloride (OCEAN) 0.65 % SOLN nasal spray Place 1 spray into both nostrils as needed for congestion.    . triamcinolone ointment (KENALOG) 0.5 % Apply 1 application topically 2 (two) times daily as needed (irritation).    Marland Kitchen umeclidinium-vilanterol (ANORO ELLIPTA) 62.5-25 MCG/INH AEPB Inhale 1 puff into the lungs daily as needed (shortness of breath).     No current  facility-administered medications for this visit.      Musculoskeletal: Strength & Muscle Tone: within normal limits Gait & Station: normal Patient leans: N/A  Psychiatric Specialty Exam: Review of Systems  Musculoskeletal: Positive for back pain.  All other systems reviewed and are negative.   Blood pressure 119/75, pulse 85, height 5' 4.5" (1.638 m), weight 143 lb (64.9 kg), SpO2 95 %.Body mass index is 24.17 kg/m.  General Appearance: Casual and Fairly Groomed  Eye Contact:  Good  Speech:  Clear and Coherent  Volume:  Normal  Mood:  Euthymic  Affect:  Congruent  Thought Process:  Goal Directed  Orientation:  Full (Time, Place, and Person)  Thought Content: WDL   Suicidal Thoughts:  No  Homicidal Thoughts:  No  Memory:  Immediate;   Good Recent;   Good Remote;   Fair  Judgement:  Good  Insight:  Fair  Psychomotor Activity:  Normal  Concentration:  Concentration: Good and Attention Span: Good  Recall:  Good  Fund of Knowledge: Good  Language: Good  Akathisia:  No  Handed:  Right  AIMS (if indicated): not done  Assets:  Communication Skills Desire for Improvement Physical Health Resilience Social Support Talents/Skills  ADL's:  Intact  Cognition: WNL  Sleep:  Good   Screenings: PHQ2-9     Office Visit from 04/25/2017 in [facility]  PHQ-2 Total Score  0       Assessment and Plan: This patient is a 68 year old female with a history of  depression and anxiety.  She is doing well on her current regimen.  She will continue Xanax 0.5 mg 4 times daily for anxiety and Zoloft 50 mg twice daily for depression.  She will return to see me in 3 months   [name], MD 05/21/2018, 1:35 PM

## 2018-06-20 ENCOUNTER — Other Ambulatory Visit (HOSPITAL_COMMUNITY): Payer: Self-pay | Admitting: Psychiatry

## 2018-06-21 ENCOUNTER — Telehealth (HOSPITAL_COMMUNITY): Payer: Self-pay

## 2018-06-21 ENCOUNTER — Other Ambulatory Visit (HOSPITAL_COMMUNITY): Payer: Self-pay | Admitting: Psychiatry

## 2018-06-21 MED ORDER — ALPRAZOLAM 0.5 MG PO TABS: 0.5000 mg | ORAL_TABLET | Freq: Four times a day (QID) | ORAL | 2 refills | Status: DC

## 2018-06-21 NOTE — Telephone Encounter (Signed)
Medication management - Telephone call with pt who called to request Dr. Harrington Challenger change her Alprazolam order to 0.5 mg tablets, 4 times a day as she is having a really hard time cutting the pills and the pharmacy requested she change to 0.5 mg as they have these in now.  Patient stated she is on her last pill and requests Dr. Harrington Challenger send in this order so she can pick them up.  Agreed to send request to Dr. Harrington Challenger.

## 2018-06-21 NOTE — Telephone Encounter (Signed)
Medication management - Telephone call with patient to inform Dr. Harrington Challenger had sent in her requested changed dosage of Alprazolam to 0.5 mg tablets.  Patient to call back if any problems filling.

## 2018-06-21 NOTE — Telephone Encounter (Signed)
sent 

## 2018-08-20 ENCOUNTER — Ambulatory Visit (HOSPITAL_COMMUNITY): Payer: Medicare HMO | Admitting: Psychiatry

## 2018-08-21 ENCOUNTER — Encounter (HOSPITAL_COMMUNITY): Payer: Self-pay | Admitting: Psychiatry

## 2018-08-21 ENCOUNTER — Ambulatory Visit (INDEPENDENT_AMBULATORY_CARE_PROVIDER_SITE_OTHER): Payer: Medicare HMO | Admitting: Psychiatry

## 2018-08-21 ENCOUNTER — Other Ambulatory Visit: Payer: Self-pay

## 2018-08-21 DIAGNOSIS — F331 Major depressive disorder, recurrent, moderate: Secondary | ICD-10-CM | POA: Diagnosis not present

## 2018-08-21 MED ORDER — SERTRALINE HCL 50 MG PO TABS: 50.0000 mg | ORAL_TABLET | Freq: Two times a day (BID) | ORAL | 2 refills | Status: DC

## 2018-08-21 MED ORDER — ALPRAZOLAM 0.5 MG PO TABS: 0.5000 mg | ORAL_TABLET | Freq: Four times a day (QID) | ORAL | 2 refills | Status: DC

## 2018-08-21 NOTE — Progress Notes (Signed)
Virtual Visit via Telephone Note  I connected with Rebecca [name] on 08/21/18 at  1:40 PM EDT by telephone and verified that I am speaking with the correct person using two identifiers.   I discussed the limitations, risks, security and privacy concerns of performing an evaluation and management service by telephone and the availability of in person appointments. I also discussed with the patient that there may be a patient responsible charge related to this service. The patient expressed understanding and agreed to proceed.       I discussed the assessment and treatment plan with the patient. The patient was provided an opportunity to ask questions and all were answered. The patient agreed with the plan and demonstrated an understanding of the instructions.   The patient was advised to call back or seek an in-person evaluation if the symptoms worsen or if the condition fails to improve as anticipated.  I provided 15 minutes of non-face-to-face time during this encounter.   [name], MD  [facility] MD/PA/NP OP Progress Note  08/21/2018 2:02 PM Rebecca [name]  MRN:  [id number]  Chief Complaint: depression, anxiety HPI: This patient is a 68 year old white female who lives with her husband in [place].  She is a retired [employer]ar[employer] manager.  The patient returns for follow-up today regarding depression and anxiety.  She was last seen 3 months ago.  She is assessed today via telephone due to the coronavirus pandemic.  She states overall she is doing fairly well.  Her mother-in-law fell recently and broke her arm and she is helping to take care of her which has been somewhat stressful.  She states that home PT and home health nursing has been coming into help.  She states that her anxiety is well controlled she is sleeping well her mood is good and she does not have any physical complaints other than allergies.  She states that her energy is generally fairly good and when it is nice out she is getting out and  going walking.  She does think her medications are effective Visit Diagnosis:    ICD-10-CM   1. Major depressive disorder, recurrent episode, moderate (HCC) F33.1     Past Psychiatric History: 2 prior psychiatric hospitalizations for depression  Past Medical History:  Past Medical History:  Diagnosis Date  . Allergy   . Amenia   . Anxiety   . Arthritis   . Chronic back pain   . Chronic kidney disease    cyst on bil kidneys  . Colitis, ischemic ([place])   . Colon polyp   . Complication of anesthesia   . COPD (chronic obstructive pulmonary disease) ([place])   . Coronary artery disease   . Depression   . Dysphagia   . Fibromyalgia   . GERD (gastroesophageal reflux disease)   . Hiatal hernia   . Hyperlipidemia   . Hypertension   . IBS (irritable bowel syndrome)   . Myocardial infarction ([place])   . 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 Peripheral neuropathy   . Pneumonia   . PONV (postoperative nausea and vomiting)   . Schatzki's ring   . Sciatica   . Sensorineural hearing loss   . Stroke ([place])    T.I. Colitis  . Tinnitus   . UTI (urinary tract infection)     Past Surgical History:  Procedure Laterality Date  . ABDOMINAL HYSTERECTOMY    . BACK SURGERY    . BLADDER SURGERY    . BREAST SURGERY    .  carple tunnal  surgery     Right hand  . CHOLECYSTECTOMY N/A 03/26/2018   Procedure: LAPAROSCOPIC CHOLECYSTECTOMY WITH ATTEMPTED INTRAOPERATIVE CHOLANGIOGRAM;  Surgeon: Donnie Mesa, MD;  Location: [facility];  Service: General;  Laterality: N/A;  . COLONOSCOPY  09/10/2005   in [place]  . DILATION AND CURETTAGE OF UTERUS    . NECK SURGERY     cyst on neck  . pillonidal cyst     resection  . TUBAL LIGATION    . UPPER GI ENDOSCOPY  05/05/2011    Family Psychiatric History: See below  Family History:  Family History  Problem Relation Age of Onset  . Alcohol abuse Father   . Depression Father   . Cancer Father        lung  . Cancer Paternal Grandfather         lung  . Osteoporosis Maternal Grandmother   . Kyphosis Maternal Grandmother   . Stroke Maternal Grandfather   . Osteoporosis Mother   . Kyphosis Mother   . Stroke Mother   . Heart disease Mother   . Kidney disease Mother   . Sleep apnea Mother        said used oxygen at night   . Arthritis Brother   . Arthritis Brother   . Scoliosis Daughter   . Scoliosis Son   . Colon cancer Maternal Uncle   . Diabetes Maternal Uncle   . Irritable bowel syndrome Maternal Uncle   . Esophageal cancer Neg Hx   . Stomach cancer Neg Hx     Social History:  Social History   Socioeconomic History  . Marital status: Married    Spouse name: Not on file  . Number of children: 2  . Years of education: Not on file  . Highest education level: Not on file  Occupational History  . Occupation: CNA  Social Needs  . Financial resource strain: Not on file  . Food insecurity:    Worry: Not on file    Inability: Not on file  . Transportation needs:    Medical: Not on file    Non-medical: Not on file  Tobacco Use  . Smoking status: Current Every Day Smoker    Packs/day: 1.00    Years: 44.00    Pack years: 44.00    Types: Cigarettes  . Smokeless tobacco: Never Used  . Tobacco comment: smokes 5 cig daily  Substance and Sexual Activity  . Alcohol use: No    Alcohol/week: 0.0 standard drinks  . Drug use: No  . Sexual activity: Yes    Birth control/protection: Surgical    Comment: hyst  Lifestyle  . Physical activity:    Days per week: Not on file    Minutes per session: Not on file  . Stress: Not on file  Relationships  . Social connections:    Talks on phone: Not on file    Gets together: Not on file    Attends religious service: Not on file    Active member of club or organization: Not on file    Attends meetings of clubs or organizations: Not on file    Relationship status: Not on file  Other Topics Concern  . Not on file  Social History Narrative  . Not on file    Allergies:   Allergies  Allergen Reactions  . Bupropion Hcl Swelling  . Doxycycline Other (See Comments)    When  she is on Valium   . Simvastatin Other (See Comments)  Sleepy and weak  . Toradol [Ketorolac Tromethamine] Other (See Comments)    Causes gi bleeding    . Cephalexin Other (See Comments)    Burning with urination  . Darvocet [Propoxyphene N-Acetaminophen] Anxiety  . Penicillins Swelling and Rash    Has patient had a PCN reaction causing immediate rash, facial/tongue/throat swelling, SOB or lightheadedness with hypotension: No Has patient had a PCN reaction causing severe rash involving mucus membranes or skin necrosis: No Has patient had a PCN reaction that required hospitalization: No Has patient had a PCN reaction occurring within the last 10 years: No If all of the above answers are "NO", then may proceed with Cephalosporin use.     Metabolic Disorder Labs: No results found for: HGBA1C, MPG No results found for: PROLACTIN Lab Results  Component Value Date   CHOL 235 (H) 08/28/2008   TRIG 147 08/28/2008   HDL 52 08/28/2008   CHOLHDL 4.5 Ratio 08/28/2008   VLDL 29 08/28/2008   LDLCALC 154 (H) 08/28/2008   Lab Results  Component Value Date   TSH 1.511 08/28/2008   TSH 0.802 08/16/2007    Therapeutic Level Labs: No results found for: LITHIUM No results found for: VALPROATE No components found for:  CBMZ  Current Medications: Current Outpatient Medications  Medication Sig Dispense Refill  . acetaminophen (TYLENOL) 500 MG tablet Take 500 mg by mouth every 6 (six) hours as needed for moderate pain.    Marland Kitchen albuterol (PROVENTIL) (2.5 MG/3ML) 0.083% nebulizer solution Take 2.5 mg by nebulization every 6 (six) hours as needed for wheezing or shortness of breath.    . ALPRAZolam (XANAX) 0.5 MG tablet Take 1 tablet (0.5 mg total) by mouth 4 (four) times daily. 120 tablet 2  . betamethasone dipropionate (DIPROLENE) 0.05 % cream Apply 1 application topically 2 (two) times  daily as needed (rash).     . conjugated estrogens (PREMARIN) vaginal cream Place 1 Applicatorful vaginally daily.     . Cyanocobalamin (VITAMIN B-12 IJ) Inject 1,000 mcg as directed every 30 (thirty) days.     . fluticasone (FLONASE) 50 MCG/ACT nasal spray Place 1 spray into both nostrils daily.     Marland Kitchen HYDROcodone-acetaminophen (NORCO/VICODIN) 5-325 MG tablet Take 1 tablet by mouth every 6 (six) hours as needed for moderate pain. 20 tablet 0  . montelukast (SINGULAIR) 10 MG tablet Take 10 mg by mouth daily as needed (allergies).     . nicotine (NICODERM CQ - DOSED IN MG/24 HR) 7 mg/24hr patch Place 7 mg onto the skin daily.    . promethazine (PHENERGAN) 25 MG tablet Take 25 mg by mouth every 6 (six) hours as needed for nausea or vomiting.    . ranitidine (ZANTAC) 150 MG tablet Take 150 mg by mouth at bedtime.  5  . sertraline (ZOLOFT) 50 MG tablet Take 1 tablet (50 mg total) by mouth 2 (two) times daily. 180 tablet 2  . sodium chloride (OCEAN) 0.65 % SOLN nasal spray Place 1 spray into both nostrils as needed for congestion.    . triamcinolone ointment (KENALOG) 0.5 % Apply 1 application topically 2 (two) times daily as needed (irritation).    Marland Kitchen umeclidinium-vilanterol (ANORO ELLIPTA) 62.5-25 MCG/INH AEPB Inhale 1 puff into the lungs daily as needed (shortness of breath).     No current facility-administered medications for this visit.      Musculoskeletal: Strength & Muscle Tone: within normal limits Gait & Station: normal Patient leans: N/A  Psychiatric Specialty Exam: Review of Systems  HENT:  Positive for congestion and sore throat.   All other systems reviewed and are negative.   There were no vitals taken for this visit.There is no height or weight on file to calculate BMI.  General Appearance: NA  Eye Contact:  NA  Speech:  Clear and Coherent  Volume:  Normal  Mood:  Euthymic  Affect:  NA  Thought Process:  Goal Directed  Orientation:  Full (Time, Place, and Person)   Thought Content: WDL   Suicidal Thoughts:  No  Homicidal Thoughts:  No  Memory:  Immediate;   Good Recent;   Good Remote;   Fair  Judgement:  Good  Insight:  Fair  Psychomotor Activity:  Normal  Concentration:  Concentration: Good and Attention Span: Good  Recall:  Good  Fund of Knowledge: Fair  Language: Good  Akathisia:  No  Handed:  Right  AIMS (if indicated): not done  Assets:  Communication Skills Desire for Improvement Resilience Social Support Talents/Skills  ADL's:  Intact  Cognition: WNL  Sleep:  Good   Screenings: PHQ2-9     Office Visit from 04/25/2017 in [facility]  PHQ-2 Total Score  0       Assessment and Plan:   This patient is a 68 year old female with a history of depression and anxiety.  She is doing well on her current regimen.  She will continue Xanax 0.5 mg 4 times daily for anxiety and Zoloft 50 mg twice daily for depression.  She will return to see me in 3 months [name], MD 08/21/2018, 2:02 PM

## 2018-11-04 ENCOUNTER — Other Ambulatory Visit: Payer: Self-pay

## 2018-11-04 DIAGNOSIS — Z20822 Contact with and (suspected) exposure to covid-19: Secondary | ICD-10-CM

## 2018-11-06 LAB — NOVEL CORONAVIRUS, NAA: SARS-CoV-2, NAA: NOT DETECTED

## 2018-11-26 ENCOUNTER — Other Ambulatory Visit: Payer: Self-pay

## 2018-11-26 ENCOUNTER — Encounter (HOSPITAL_COMMUNITY): Payer: Self-pay | Admitting: Psychiatry

## 2018-11-26 ENCOUNTER — Ambulatory Visit (INDEPENDENT_AMBULATORY_CARE_PROVIDER_SITE_OTHER): Payer: Medicare HMO | Admitting: Psychiatry

## 2018-11-26 DIAGNOSIS — F331 Major depressive disorder, recurrent, moderate: Secondary | ICD-10-CM

## 2018-11-26 MED ORDER — ALPRAZOLAM 0.5 MG PO TABS: 0.5000 mg | ORAL_TABLET | Freq: Four times a day (QID) | ORAL | 2 refills | Status: DC

## 2018-11-26 MED ORDER — SERTRALINE HCL 50 MG PO TABS: 50.0000 mg | ORAL_TABLET | Freq: Two times a day (BID) | ORAL | 2 refills | Status: DC

## 2018-11-26 NOTE — Progress Notes (Signed)
Virtual Visit via Telephone Note  I connected with Rebecca [name] on 11/26/18 at  1:20 PM EDT by telephone and verified that I am speaking with the correct person using two identifiers.   I discussed the limitations, risks, security and privacy concerns of performing an evaluation and management service by telephone and the availability of in person appointments. I also discussed with the patient that there may be a patient responsible charge related to this service. The patient expressed understanding and agreed to proceed.    I discussed the assessment and treatment plan with the patient. The patient was provided an opportunity to ask questions and all were answered. The patient agreed with the plan and demonstrated an understanding of the instructions.   The patient was advised to call back or seek an in-person evaluation if the symptoms worsen or if the condition fails to improve as anticipated.  I provided 15 minutes of non-face-to-face time during this encounter.   [name], MD  [facility] MD/PA/NP OP Progress Note  11/26/2018 1:38 PM Rebecca [name]  MRN:  [id number]  Chief Complaint:  Chief Complaint    Depression; Anxiety; Follow-up     HPI: This patient is a 68 year old white female who lives with her husband and mother-in-law in [place].  She is a retired [employer]ar[employer] manager.  The patient returns for follow-up today regarding depression and anxiety.  She states that overall she is doing fairly well.  However she and her husband were cleaning out an outdoor shed and she got bitten up by some type of insects.  She has been trying Atarax from her doctor but it is not helping and she will need to go back.  Overall she is sleeping well her mood is good and her anxiety is under good control.  Her mother-in-law's health has improved which is made her feel more relaxed. Visit Diagnosis:    ICD-10-CM   1. Major depressive disorder, recurrent episode, moderate (HCC)  F33.1     Past Psychiatric  History: 2 prior psychiatric hospitalizations for depression  Past Medical History:  Past Medical History:  Diagnosis Date  . Allergy   . Amenia   . Anxiety   . Arthritis   . Chronic back pain   . Chronic kidney disease    cyst on bil kidneys  . Colitis, ischemic ([place])   . Colon polyp   . Complication of anesthesia   . COPD (chronic obstructive pulmonary disease) ([place])   . Coronary artery disease   . Depression   . Dysphagia   . Fibromyalgia   . GERD (gastroesophageal reflux disease)   . Hiatal hernia   . Hyperlipidemia   . Hypertension   . IBS (irritable bowel syndrome)   . Myocardial infarction ([place])   . 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 Peripheral neuropathy   . Pneumonia   . PONV (postoperative nausea and vomiting)   . Schatzki's ring   . Sciatica   . Sensorineural hearing loss   . Stroke ([place])    T.I. Colitis  . Tinnitus   . UTI (urinary tract infection)     Past Surgical History:  Procedure Laterality Date  . ABDOMINAL HYSTERECTOMY    . BACK SURGERY    . BLADDER SURGERY    . BREAST SURGERY    . carple tunnal  surgery     Right hand  . CHOLECYSTECTOMY N/A 03/26/2018   Procedure: LAPAROSCOPIC CHOLECYSTECTOMY WITH ATTEMPTED INTRAOPERATIVE CHOLANGIOGRAM;  Surgeon: Donnie Mesa, MD;  Location: [facility];  Service: General;  Laterality: N/A;  . COLONOSCOPY  09/10/2005   in [place]  . DILATION AND CURETTAGE OF UTERUS    . NECK SURGERY     cyst on neck  . pillonidal cyst     resection  . TUBAL LIGATION    . UPPER GI ENDOSCOPY  05/05/2011    Family Psychiatric History: See below  Family History:  Family History  Problem Relation Age of Onset  . Alcohol abuse Father   . Depression Father   . Cancer Father        lung  . Cancer Paternal Grandfather        lung  . Osteoporosis Maternal Grandmother   . Kyphosis Maternal Grandmother   . Stroke Maternal Grandfather   . Osteoporosis Mother   . Kyphosis Mother   . Stroke Mother   . Heart  disease Mother   . Kidney disease Mother   . Sleep apnea Mother        said used oxygen at night   . Arthritis Brother   . Arthritis Brother   . Scoliosis Daughter   . Scoliosis Son   . Colon cancer Maternal Uncle   . Diabetes Maternal Uncle   . Irritable bowel syndrome Maternal Uncle   . Esophageal cancer Neg Hx   . Stomach cancer Neg Hx     Social History:  Social History   Socioeconomic History  . Marital status: Married    Spouse name: Not on file  . Number of children: 2  . Years of education: Not on file  . Highest education level: Not on file  Occupational History  . Occupation: CNA  Social Needs  . Financial resource strain: Not on file  . Food insecurity    Worry: Not on file    Inability: Not on file  . Transportation needs    Medical: Not on file    Non-medical: Not on file  Tobacco Use  . Smoking status: Current Every Day Smoker    Packs/day: 1.00    Years: 44.00    Pack years: 44.00    Types: Cigarettes  . Smokeless tobacco: Never Used  . Tobacco comment: smokes 5 cig daily  Substance and Sexual Activity  . Alcohol use: No    Alcohol/week: 0.0 standard drinks  . Drug use: No  . Sexual activity: Yes    Birth control/protection: Surgical    Comment: hyst  Lifestyle  . Physical activity    Days per week: Not on file    Minutes per session: Not on file  . Stress: Not on file  Relationships  . Social Herbalist on phone: Not on file    Gets together: Not on file    Attends religious service: Not on file    Active member of club or organization: Not on file    Attends meetings of clubs or organizations: Not on file    Relationship status: Not on file  Other Topics Concern  . Not on file  Social History Narrative  . Not on file    Allergies:  Allergies  Allergen Reactions  . Bupropion Hcl Swelling  . Doxycycline Other (See Comments)    When  she is on Valium   . Simvastatin Other (See Comments)    Sleepy and weak  . Toradol  [Ketorolac Tromethamine] Other (See Comments)    Causes gi bleeding    . Cephalexin Other (See Comments)  Burning with urination  . Darvocet [Propoxyphene N-Acetaminophen] Anxiety  . Penicillins Swelling and Rash    Has patient had a PCN reaction causing immediate rash, facial/tongue/throat swelling, SOB or lightheadedness with hypotension: No Has patient had a PCN reaction causing severe rash involving mucus membranes or skin necrosis: No Has patient had a PCN reaction that required hospitalization: No Has patient had a PCN reaction occurring within the last 10 years: No If all of the above answers are "NO", then may proceed with Cephalosporin use.     Metabolic Disorder Labs: No results found for: HGBA1C, MPG No results found for: PROLACTIN Lab Results  Component Value Date   CHOL 235 (H) 08/28/2008   TRIG 147 08/28/2008   HDL 52 08/28/2008   CHOLHDL 4.5 Ratio 08/28/2008   VLDL 29 08/28/2008   LDLCALC 154 (H) 08/28/2008   Lab Results  Component Value Date   TSH 1.511 08/28/2008   TSH 0.802 08/16/2007    Therapeutic Level Labs: No results found for: LITHIUM No results found for: VALPROATE No components found for:  CBMZ  Current Medications: Current Outpatient Medications  Medication Sig Dispense Refill  . acetaminophen (TYLENOL) 500 MG tablet Take 500 mg by mouth every 6 (six) hours as needed for moderate pain.    Marland Kitchen albuterol (PROVENTIL) (2.5 MG/3ML) 0.083% nebulizer solution Take 2.5 mg by nebulization every 6 (six) hours as needed for wheezing or shortness of breath.    . ALPRAZolam (XANAX) 0.5 MG tablet Take 1 tablet (0.5 mg total) by mouth 4 (four) times daily. 120 tablet 2  . betamethasone dipropionate (DIPROLENE) 0.05 % cream Apply 1 application topically 2 (two) times daily as needed (rash).     . conjugated estrogens (PREMARIN) vaginal cream Place 1 Applicatorful vaginally daily.     . Cyanocobalamin (VITAMIN B-12 IJ) Inject 1,000 mcg as directed every 30  (thirty) days.     . fluticasone (FLONASE) 50 MCG/ACT nasal spray Place 1 spray into both nostrils daily.     Marland Kitchen HYDROcodone-acetaminophen (NORCO/VICODIN) 5-325 MG tablet Take 1 tablet by mouth every 6 (six) hours as needed for moderate pain. 20 tablet 0  . montelukast (SINGULAIR) 10 MG tablet Take 10 mg by mouth daily as needed (allergies).     . nicotine (NICODERM CQ - DOSED IN MG/24 HR) 7 mg/24hr patch Place 7 mg onto the skin daily.    . promethazine (PHENERGAN) 25 MG tablet Take 25 mg by mouth every 6 (six) hours as needed for nausea or vomiting.    . ranitidine (ZANTAC) 150 MG tablet Take 150 mg by mouth at bedtime.  5  . sertraline (ZOLOFT) 50 MG tablet Take 1 tablet (50 mg total) by mouth 2 (two) times daily. 180 tablet 2  . sodium chloride (OCEAN) 0.65 % SOLN nasal spray Place 1 spray into both nostrils as needed for congestion.    . triamcinolone ointment (KENALOG) 0.5 % Apply 1 application topically 2 (two) times daily as needed (irritation).    Marland Kitchen umeclidinium-vilanterol (ANORO ELLIPTA) 62.5-25 MCG/INH AEPB Inhale 1 puff into the lungs daily as needed (shortness of breath).     No current facility-administered medications for this visit.      Musculoskeletal: Strength & Muscle Tone: within normal limits Gait & Station: normal Patient leans: N/A  Psychiatric Specialty Exam: Review of Systems  Skin: Positive for itching and rash.  All other systems reviewed and are negative.   There were no vitals taken for this visit.There is no height or weight  on file to calculate BMI.  General Appearance: NA  Eye Contact:  NA  Speech:  Clear and Coherent  Volume:  Normal  Mood:  Euthymic  Affect:  NA  Thought Process:  Goal Directed  Orientation:  Full (Time, Place, and Person)  Thought Content: Rumination   Suicidal Thoughts:  No  Homicidal Thoughts:  No  Memory:  Immediate;   Good Recent;   Good Remote;   Good  Judgement:  Good  Insight:  Fair  Psychomotor Activity:  Normal   Concentration:  Concentration: Good and Attention Span: Good  Recall:  Good  Fund of Knowledge: Good  Language: Good  Akathisia:  No  Handed:  Right  AIMS (if indicated): not done  Assets:  Communication Skills Desire for Improvement Physical Health Resilience Social Support Talents/Skills  ADL's:  Intact  Cognition: WNL  Sleep:  Good   Screenings: PHQ2-9     Office Visit from 04/25/2017 in [facility]  PHQ-2 Total Score  0       Assessment and Plan: This patient is a 68 year old female with a history of depression and anxiety.  She is doing well on her current regimen.  She will continue Zoloft 50 mg twice daily for depression and Xanax 0.5 mg 4 times daily for anxiety.  She will return to see me in 3 months   [name], MD 11/26/2018, 1:38 PM

## 2019-02-26 ENCOUNTER — Other Ambulatory Visit: Payer: Self-pay

## 2019-02-26 ENCOUNTER — Encounter (HOSPITAL_COMMUNITY): Payer: Self-pay | Admitting: Psychiatry

## 2019-02-26 ENCOUNTER — Ambulatory Visit (INDEPENDENT_AMBULATORY_CARE_PROVIDER_SITE_OTHER): Payer: Medicare HMO | Admitting: Psychiatry

## 2019-02-26 DIAGNOSIS — F331 Major depressive disorder, recurrent, moderate: Secondary | ICD-10-CM

## 2019-02-26 MED ORDER — ALPRAZOLAM 0.5 MG PO TABS: 0.5000 mg | ORAL_TABLET | Freq: Four times a day (QID) | ORAL | 2 refills | Status: DC

## 2019-02-26 MED ORDER — SERTRALINE HCL 50 MG PO TABS: 50.0000 mg | ORAL_TABLET | Freq: Two times a day (BID) | ORAL | 2 refills | Status: DC

## 2019-02-26 NOTE — Progress Notes (Signed)
Virtual Visit via Telephone Note  I connected with Rebecca [name] on 02/26/19 at  1:20 PM EST by telephone and verified that I am speaking with the correct person using two identifiers.   I discussed the limitations, risks, security and privacy concerns of performing an evaluation and management service by telephone and the availability of in person appointments. I also discussed with the patient that there may be a patient responsible charge related to this service. The patient expressed understanding and agreed to proceed.    I discussed the assessment and treatment plan with the patient. The patient was provided an opportunity to ask questions and all were answered. The patient agreed with the plan and demonstrated an understanding of the instructions.   The patient was advised to call back or seek an in-person evaluation if the symptoms worsen or if the condition fails to improve as anticipated.  I provided 15 minutes of non-face-to-face time during this encounter.   [name], MD  [facility] MD/PA/NP OP Progress Note  02/26/2019 1:37 PM TAMIRRA [name]  MRN:  [id number]  Chief Complaint:  Chief Complaint    Depression; Anxiety; Follow-up     HPI: This patient is a 68 year old white female who lives with her husband and mother-in-law in [place].  She is a retired [employer] manager  The patient returns for follow-up today regarding depression and anxiety.  Overall she is doing okay but recently injured her shoulder when she was lifting a picnic table.  Her primary doctor had given her a shot of prednisone which has helped to some degree.  She states that her mood is good and she denies significant anxiety or panic attacks.  She is sleeping well at night. Visit Diagnosis:    ICD-10-CM   1. Major depressive disorder, recurrent episode, moderate (HCC)  F33.1     Past Psychiatric History: 2 prior psychiatric hospitalizations for depression  Past Medical History:  Past Medical History:  Diagnosis  Date  . Allergy   . Amenia   . Anxiety   . Arthritis   . Chronic back pain   . Chronic kidney disease    cyst on bil kidneys  . Colitis, ischemic ([place])   . Colon polyp   . Complication of anesthesia   . COPD (chronic obstructive pulmonary disease) ([place])   . Coronary artery disease   . Depression   . Dysphagia   . Fibromyalgia   . GERD (gastroesophageal reflux disease)   . Hiatal hernia   . Hyperlipidemia   . Hypertension   . IBS (irritable bowel syndrome)   . Myocardial infarction ([place])   . 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 Peripheral neuropathy   . Pneumonia   . PONV (postoperative nausea and vomiting)   . Schatzki's ring   . Sciatica   . Sensorineural hearing loss   . Stroke ([place])    T.I. Colitis  . Tinnitus   . UTI (urinary tract infection)     Past Surgical History:  Procedure Laterality Date  . ABDOMINAL HYSTERECTOMY    . BACK SURGERY    . BLADDER SURGERY    . BREAST SURGERY    . carple tunnal  surgery     Right hand  . CHOLECYSTECTOMY N/A 03/26/2018   Procedure: LAPAROSCOPIC CHOLECYSTECTOMY WITH ATTEMPTED INTRAOPERATIVE CHOLANGIOGRAM;  Surgeon: Donnie Mesa, MD;  Location: [facility];  Service: General;  Laterality: N/A;  . COLONOSCOPY  09/10/2005   in [place]  . DILATION AND CURETTAGE OF  UTERUS    . NECK SURGERY     cyst on neck  . pillonidal cyst     resection  . TUBAL LIGATION    . UPPER GI ENDOSCOPY  05/05/2011    Family Psychiatric History: see below  Family History:  Family History  Problem Relation Age of Onset  . Alcohol abuse Father   . Depression Father   . Cancer Father        lung  . Cancer Paternal Grandfather        lung  . Osteoporosis Maternal Grandmother   . Kyphosis Maternal Grandmother   . Stroke Maternal Grandfather   . Osteoporosis Mother   . Kyphosis Mother   . Stroke Mother   . Heart disease Mother   . Kidney disease Mother   . Sleep apnea Mother        said used oxygen at night   . Arthritis  Brother   . Arthritis Brother   . Scoliosis Daughter   . Scoliosis Son   . Colon cancer Maternal Uncle   . Diabetes Maternal Uncle   . Irritable bowel syndrome Maternal Uncle   . Esophageal cancer Neg Hx   . Stomach cancer Neg Hx     Social History:  Social History   Socioeconomic History  . Marital status: Married    Spouse name: Not on file  . Number of children: 2  . Years of education: Not on file  . Highest education level: Not on file  Occupational History  . Occupation: CNA  Social Needs  . Financial resource strain: Not on file  . Food insecurity    Worry: Not on file    Inability: Not on file  . Transportation needs    Medical: Not on file    Non-medical: Not on file  Tobacco Use  . Smoking status: Current Every Day Smoker    Packs/day: 1.00    Years: 44.00    Pack years: 44.00    Types: Cigarettes  . Smokeless tobacco: Never Used  . Tobacco comment: smokes 5 cig daily  Substance and Sexual Activity  . Alcohol use: No    Alcohol/week: 0.0 standard drinks  . Drug use: No  . Sexual activity: Yes    Birth control/protection: Surgical    Comment: hyst  Lifestyle  . Physical activity    Days per week: Not on file    Minutes per session: Not on file  . Stress: Not on file  Relationships  . Social Herbalist on phone: Not on file    Gets together: Not on file    Attends religious service: Not on file    Active member of club or organization: Not on file    Attends meetings of clubs or organizations: Not on file    Relationship status: Not on file  Other Topics Concern  . Not on file  Social History Narrative  . Not on file    Allergies:  Allergies  Allergen Reactions  . Bupropion Hcl Swelling  . Doxycycline Other (See Comments)    When  she is on Valium   . Simvastatin Other (See Comments)    Sleepy and weak  . Toradol [Ketorolac Tromethamine] Other (See Comments)    Causes gi bleeding    . Cephalexin Other (See Comments)     Burning with urination  . Darvocet [Propoxyphene N-Acetaminophen] Anxiety  . Penicillins Swelling and Rash    Has patient had a PCN reaction  causing immediate rash, facial/tongue/throat swelling, SOB or lightheadedness with hypotension: No Has patient had a PCN reaction causing severe rash involving mucus membranes or skin necrosis: No Has patient had a PCN reaction that required hospitalization: No Has patient had a PCN reaction occurring within the last 10 years: No If all of the above answers are "NO", then may proceed with Cephalosporin use.     Metabolic Disorder Labs: No results found for: HGBA1C, MPG No results found for: PROLACTIN Lab Results  Component Value Date   CHOL 235 (H) 08/28/2008   TRIG 147 08/28/2008   HDL 52 08/28/2008   CHOLHDL 4.5 Ratio 08/28/2008   VLDL 29 08/28/2008   LDLCALC 154 (H) 08/28/2008   Lab Results  Component Value Date   TSH 1.511 08/28/2008   TSH 0.802 08/16/2007    Therapeutic Level Labs: No results found for: LITHIUM No results found for: VALPROATE No components found for:  CBMZ  Current Medications: Current Outpatient Medications  Medication Sig Dispense Refill  . acetaminophen (TYLENOL) 500 MG tablet Take 500 mg by mouth every 6 (six) hours as needed for moderate pain.    Marland Kitchen albuterol (PROVENTIL) (2.5 MG/3ML) 0.083% nebulizer solution Take 2.5 mg by nebulization every 6 (six) hours as needed for wheezing or shortness of breath.    . ALPRAZolam (XANAX) 0.5 MG tablet Take 1 tablet (0.5 mg total) by mouth 4 (four) times daily. 120 tablet 2  . betamethasone dipropionate (DIPROLENE) 0.05 % cream Apply 1 application topically 2 (two) times daily as needed (rash).     . conjugated estrogens (PREMARIN) vaginal cream Place 1 Applicatorful vaginally daily.     . Cyanocobalamin (VITAMIN B-12 IJ) Inject 1,000 mcg as directed every 30 (thirty) days.     . fluticasone (FLONASE) 50 MCG/ACT nasal spray Place 1 spray into both nostrils daily.     Marland Kitchen  HYDROcodone-acetaminophen (NORCO/VICODIN) 5-325 MG tablet Take 1 tablet by mouth every 6 (six) hours as needed for moderate pain. 20 tablet 0  . montelukast (SINGULAIR) 10 MG tablet Take 10 mg by mouth daily as needed (allergies).     . nicotine (NICODERM CQ - DOSED IN MG/24 HR) 7 mg/24hr patch Place 7 mg onto the skin daily.    . promethazine (PHENERGAN) 25 MG tablet Take 25 mg by mouth every 6 (six) hours as needed for nausea or vomiting.    . ranitidine (ZANTAC) 150 MG tablet Take 150 mg by mouth at bedtime.  5  . sertraline (ZOLOFT) 50 MG tablet Take 1 tablet (50 mg total) by mouth 2 (two) times daily. 180 tablet 2  . sodium chloride (OCEAN) 0.65 % SOLN nasal spray Place 1 spray into both nostrils as needed for congestion.    . triamcinolone ointment (KENALOG) 0.5 % Apply 1 application topically 2 (two) times daily as needed (irritation).    Marland Kitchen umeclidinium-vilanterol (ANORO ELLIPTA) 62.5-25 MCG/INH AEPB Inhale 1 puff into the lungs daily as needed (shortness of breath).     No current facility-administered medications for this visit.      Musculoskeletal: Strength & Muscle Tone: within normal limits Gait & Station: normal Patient leans: N/A  Psychiatric Specialty Exam: Review of Systems  Musculoskeletal: Positive for back pain and joint pain.  All other systems reviewed and are negative.   There were no vitals taken for this visit.There is no height or weight on file to calculate BMI.  General Appearance: NA  Eye Contact:  NA  Speech:  Clear and Coherent  Volume:  Normal  Mood:  Euthymic  Affect:  NA  Thought Process:  Goal Directed  Orientation:  Full (Time, Place, and Person)  Thought Content: WDL   Suicidal Thoughts:  No  Homicidal Thoughts:  No  Memory:  Immediate;   Good Recent;   Good Remote;   Fair  Judgement:  Good  Insight:  Good  Psychomotor Activity:  Normal  Concentration:  Concentration: Good and Attention Span: Good  Recall:  Good  Fund of Knowledge: Good   Language: Good  Akathisia:  No  Handed:  Right  AIMS (if indicated): not done  Assets:  Communication Skills Desire for Improvement Resilience Social Support Talents/Skills  ADL's:  Intact  Cognition: WNL  Sleep:  Good   Screenings: PHQ2-9     Office Visit from 04/25/2017 in [facility]  PHQ-2 Total Score  0       Assessment and Plan: This patient is a 68 year old female with a history of depression and anxiety.  She is doing quite well on her current regimen.  She will continue Zoloft 50 mg twice daily for depression and panic attacks and Xanax 0.5 mg 4 times daily for anxiety.  She will return to see me in 3 months   [name], MD 02/26/2019, 1:37 PM

## 2019-03-12 ENCOUNTER — Emergency Department (HOSPITAL_COMMUNITY)
Admission: EM | Admit: 2019-03-12 | Discharge: 2019-03-12 | Disposition: A | Discharge status: Home / Self Care | Payer: Medicare HMO | Attending: Emergency Medicine | Admitting: Emergency Medicine

## 2019-03-12 ENCOUNTER — Encounter (HOSPITAL_COMMUNITY): Payer: Self-pay

## 2019-03-12 ENCOUNTER — Emergency Department (HOSPITAL_COMMUNITY): Payer: Medicare HMO

## 2019-03-12 ENCOUNTER — Other Ambulatory Visit: Payer: Self-pay

## 2019-03-12 DIAGNOSIS — Z79899 Other long term (current) drug therapy: Secondary | ICD-10-CM | POA: Insufficient documentation

## 2019-03-12 DIAGNOSIS — Z8673 Personal history of transient ischemic attack (TIA), and cerebral infarction without residual deficits: Secondary | ICD-10-CM | POA: Insufficient documentation

## 2019-03-12 DIAGNOSIS — I2510 Atherosclerotic heart disease of native coronary artery without angina pectoris: Secondary | ICD-10-CM | POA: Insufficient documentation

## 2019-03-12 DIAGNOSIS — R0789 Other chest pain: Secondary | ICD-10-CM | POA: Insufficient documentation

## 2019-03-12 DIAGNOSIS — N189 Chronic kidney disease, unspecified: Secondary | ICD-10-CM | POA: Diagnosis not present

## 2019-03-12 DIAGNOSIS — F17210 Nicotine dependence, cigarettes, uncomplicated: Secondary | ICD-10-CM | POA: Diagnosis not present

## 2019-03-12 DIAGNOSIS — I252 Old myocardial infarction: Secondary | ICD-10-CM | POA: Insufficient documentation

## 2019-03-12 DIAGNOSIS — J449 Chronic obstructive pulmonary disease, unspecified: Secondary | ICD-10-CM | POA: Insufficient documentation

## 2019-03-12 DIAGNOSIS — R079 Chest pain, unspecified: Secondary | ICD-10-CM | POA: Diagnosis present

## 2019-03-12 DIAGNOSIS — I129 Hypertensive chronic kidney disease with stage 1 through stage 4 chronic kidney disease, or unspecified chronic kidney disease: Secondary | ICD-10-CM | POA: Diagnosis not present

## 2019-03-12 LAB — CBC
HCT: 44.7 % (ref 36.0–46.0)
Hemoglobin: 14.4 g/dL (ref 12.0–15.0)
MCH: 32 pg (ref 26.0–34.0)
MCHC: 32.2 g/dL (ref 30.0–36.0)
MCV: 99.3 fL (ref 80.0–100.0)
Platelets: 192 10*3/uL (ref 150–400)
RBC: 4.5 MIL/uL (ref 3.87–5.11)
RDW: 12.7 % (ref 11.5–15.5)
WBC: 8.8 10*3/uL (ref 4.0–10.5)
nRBC: 0 % (ref 0.0–0.2)

## 2019-03-12 LAB — BASIC METABOLIC PANEL
Anion gap: 10 (ref 5–15)
BUN: 10 mg/dL (ref 8–23)
CO2: 25 mmol/L (ref 22–32)
Calcium: 9.5 mg/dL (ref 8.9–10.3)
Chloride: 104 mmol/L (ref 98–111)
Creatinine, Ser: 0.6 mg/dL (ref 0.44–1.00)
GFR calc Af Amer: >60 mL/min (ref >60)
GFR calc non Af Amer: >60 mL/min (ref >60)
Glucose, Bld: 110 mg/dL — ABNORMAL HIGH (ref 70–99)
Potassium: 3.5 mmol/L (ref 3.5–5.1)
Sodium: 139 mmol/L (ref 135–145)

## 2019-03-12 LAB — TROPONIN I (HIGH SENSITIVITY)
Troponin I (High Sensitivity): 2 ng/L (ref <18)
Troponin I (High Sensitivity): 2 ng/L (ref <18)
Troponin I (High Sensitivity): 2 ng/L (ref <18)

## 2019-03-12 MED ORDER — SODIUM CHLORIDE 0.9 % IV SOLN
INTRAVENOUS | Status: DC
  Administered 2019-03-12: 14:00:00 via INTRAVENOUS

## 2019-03-12 MED ORDER — IOHEXOL 350 MG/ML SOLN
100.0000 mL | Freq: Once | INTRAVENOUS | Status: AC | PRN
  Administered 2019-03-12: 100 mL via INTRAVENOUS

## 2019-03-12 NOTE — ED Provider Notes (Signed)
Care assumed from Dr. Rogene Houston.  Patient awaiting CT angiogram to rule out PE.  Has had intermittent left-sided pleuritic chest pain for the past 1 month.  EKG is unchanged and troponins are negative.  CT is negative for pulmonary embolism or other acute pathology.  Troponins remain negative.  Patient to be discharged home with atypical chest pain per plan of Dr. Rogene Houston.  Cardiology follow-up given.   Rebecca Essex, MD 03/12/19 1750

## 2019-03-12 NOTE — ED Provider Notes (Signed)
[facility] EMERGENCY DEPARTMENT Provider Note   CSN: [id number] Arrival date & time: 03/12/19  1116     History   Chief Complaint Chief Complaint  Patient presents with  . Chest Pain    HPI Rebecca [name] is a 68 y.o. female.     Patient with complaint of left-sided chest pain somewhat pleuritic in nature under left breast radiating to the left sided lateral side of the chest.  And sometimes goes up towards the left shoulder feels like a pressure.  Is been constant for a month sometimes its worse.  Not associated with shortness of breath no nausea or vomiting.  At times has gotten a little lightheaded.  No fever no cough.  No history of injury.  No leg swelling.     Past Medical History:  Diagnosis Date  . Allergy   . Amenia   . Anxiety   . Arthritis   . Chronic back pain   . Chronic kidney disease    cyst on bil kidneys  . Colitis, ischemic ([place])   . Colon polyp   . Complication of anesthesia   . COPD (chronic obstructive pulmonary disease) ([place])   . Coronary artery disease   . Depression   . Dysphagia   . Fibromyalgia   . GERD (gastroesophageal reflux disease)   . Hiatal hernia   . Hyperlipidemia   . Hypertension   . IBS (irritable bowel syndrome)   . Myocardial infarction ([place])   . 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 Peripheral neuropathy   . Pneumonia   . PONV (postoperative nausea and vomiting)   . Schatzki's ring   . Sciatica   . Sensorineural hearing loss   . Stroke ([place])    T.I. Colitis  . Tinnitus   . UTI (urinary tract infection)     Patient Active Problem List   Diagnosis Date Noted  . 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 BACK STRAIN 03/17/2010  . HEMATURIA UNSPECIFIED 02/24/2010  . Dyspareunia 02/24/2010  . OTHER SPEC HYPERTROPHIC&ATROPHIC CONDITION SKIN 02/24/2010  . DYSURIA 11/20/2008  . HYPERLIPIDEMIA 10/30/2008  . ALLERGIC RHINITIS 12/26/2007  . HEADACHE 11/27/2007   . RESTLESS LEG SYNDROME 10/03/2007  . CONSTIPATION 10/03/2007  . [place], FEMALE 10/03/2007  . 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 ANXIETY DEPRESSION 08/01/2007  . TOBACCO ABUSE 08/01/2007  . ISCHEMIC COLITIS 08/01/2007  . [place] DISEASE, LUMBOSACRAL SPINE 08/01/2007    Past Surgical History:  Procedure Laterality Date  . ABDOMINAL HYSTERECTOMY    . BACK SURGERY    . BLADDER SURGERY    . BREAST SURGERY    . carple tunnal  surgery     Right hand  . CHOLECYSTECTOMY N/A 03/26/2018   Procedure: LAPAROSCOPIC CHOLECYSTECTOMY WITH ATTEMPTED INTRAOPERATIVE CHOLANGIOGRAM;  Surgeon: Donnie Mesa, MD;  Location: [facility];  Service: General;  Laterality: N/A;  . COLONOSCOPY  09/10/2005   in [place]  . DILATION AND CURETTAGE OF UTERUS    . NECK SURGERY     cyst on neck  . pillonidal cyst     resection  . TUBAL LIGATION    . UPPER GI ENDOSCOPY  05/05/2011     OB History    Gravida  2   Para  2   Term  2   Preterm      AB      Living  2     SAB      TAB  Ectopic      Multiple      Live Births  2            Home Medications    Prior to Admission medications   Medication Sig Start Date End Date Taking? Authorizing Provider  acetaminophen (TYLENOL) 500 MG tablet Take 500 mg by mouth every 6 (six) hours as needed for moderate pain.    [provider]  albuterol (PROVENTIL) (2.5 MG/3ML) 0.083% nebulizer solution Take 2.5 mg by nebulization every 6 (six) hours as needed for wheezing or shortness of breath.    [provider]  ALPRAZolam Duanne Moron) 0.5 MG tablet Take 1 tablet (0.5 mg total) by mouth 4 (four) times daily. 02/26/19 02/26/20  Cloria Spring, MD  betamethasone dipropionate (DIPROLENE) 0.05 % cream Apply 1 application topically 2 (two) times daily as needed (rash).     [provider]  conjugated estrogens (PREMARIN) vaginal cream Place 1 Applicatorful vaginally daily.  11/08/13   [provider]  Cyanocobalamin (VITAMIN B-12 IJ) Inject 1,000 mcg as directed every 30 (thirty) days.     [provider]  fluticasone (FLONASE) 50 MCG/ACT nasal spray Place 1 spray into both nostrils daily.  03/06/17   [provider]  HYDROcodone-acetaminophen (NORCO/VICODIN) 5-325 MG tablet Take 1 tablet by mouth every 6 (six) hours as needed for moderate pain. 03/26/18   Donnie Mesa, MD  montelukast (SINGULAIR) 10 MG tablet Take 10 mg by mouth daily as needed (allergies).     [provider]  nicotine (NICODERM CQ - DOSED IN MG/24 HR) 7 mg/24hr patch Place 7 mg onto the skin daily.    [provider]  promethazine (PHENERGAN) 25 MG tablet Take 25 mg by mouth every 6 (six) hours as needed for nausea or vomiting.    [provider]  ranitidine (ZANTAC) 150 MG tablet Take 150 mg by mouth at bedtime. 01/25/18   [provider]  sertraline (ZOLOFT) 50 MG tablet Take 1 tablet (50 mg total) by mouth 2 (two) times daily. 02/26/19 02/26/20  Cloria Spring, MD  sodium chloride (OCEAN) 0.65 % SOLN nasal spray Place 1 spray into both nostrils as needed for congestion.    [provider]  triamcinolone ointment (KENALOG) 0.5 % Apply 1 application topically 2 (two) times daily as needed (irritation).    [provider]  umeclidinium-vilanterol (ANORO ELLIPTA) 62.5-25 MCG/INH AEPB Inhale 1 puff into the lungs daily as needed (shortness of breath).    [provider]    Family History Family History  Problem Relation Age of Onset  . Alcohol abuse Father   . Depression Father   . Cancer Father        lung  . Cancer Paternal Grandfather        lung  . Osteoporosis Maternal Grandmother   . Kyphosis Maternal Grandmother   . Stroke Maternal Grandfather   . Osteoporosis Mother   . Kyphosis Mother   . Stroke Mother   . Heart disease Mother   . Kidney disease Mother   . Sleep apnea Mother        said used oxygen at night    . Arthritis Brother   . Arthritis Brother   . Scoliosis Daughter   . Scoliosis Son   . Colon cancer Maternal Uncle   . Diabetes Maternal Uncle   . Irritable bowel syndrome Maternal Uncle   . Esophageal cancer Neg Hx   . Stomach cancer Neg Hx  Social History Social History   Tobacco Use  . Smoking status: Current Every Day Smoker    Packs/day: 1.00    Years: 44.00    Pack years: 44.00    Types: Cigarettes  . Smokeless tobacco: Never Used  . Tobacco comment: smokes 5 cig daily  Substance Use Topics  . Alcohol use: No    Alcohol/week: 0.0 standard drinks  . Drug use: No     Allergies   Bupropion hcl, Doxycycline, Simvastatin, Toradol [ketorolac tromethamine], Cephalexin, Darvocet [propoxyphene n-acetaminophen], and Penicillins   Review of Systems Review of Systems  Constitutional: Negative for chills and fever.  HENT: Negative for congestion, rhinorrhea and sore throat.   Eyes: Negative for visual disturbance.  Respiratory: Negative for cough and shortness of breath.   Cardiovascular: Positive for chest pain. Negative for leg swelling.  Gastrointestinal: Negative for abdominal pain, diarrhea, nausea and vomiting.  Genitourinary: Negative for dysuria.  Musculoskeletal: Negative for back pain and neck pain.  Skin: Negative for rash.  Neurological: Negative for dizziness, light-headedness and headaches.  Hematological: Does not bruise/bleed easily.  Psychiatric/Behavioral: Negative for confusion.     Physical Exam Updated Vital Signs BP (!) 120/58   Pulse 70   Temp 98.8 F (37.1 C) (Oral)   Resp 15   Ht 1.664 m (5' 5.5")   Wt 63.5 kg   SpO2 94%   BMI 22.94 kg/m   Physical Exam Vitals signs and nursing note reviewed.  Constitutional:      General: She is not in acute distress.    Appearance: Normal appearance. She is well-developed.  HENT:     Head: Normocephalic and atraumatic.  Eyes:     Extraocular Movements: Extraocular movements intact.      Conjunctiva/sclera: Conjunctivae normal.     Pupils: Pupils are equal, round, and reactive to light.  Neck:     Musculoskeletal: Normal range of motion and neck supple.  Cardiovascular:     Rate and Rhythm: Normal rate and regular rhythm.     Heart sounds: No murmur.  Pulmonary:     Effort: Pulmonary effort is normal. No respiratory distress.     Breath sounds: Normal breath sounds.  Chest:     Chest wall: Tenderness present.  Abdominal:     Palpations: Abdomen is soft.     Tenderness: There is no abdominal tenderness.  Musculoskeletal:        General: No swelling.  Skin:    General: Skin is warm and dry.     Capillary Refill: Capillary refill takes less than 2 seconds.  Neurological:     General: No focal deficit present.     Mental Status: She is alert and oriented to person, place, and time.      ED Treatments / Results  Labs (all labs ordered are listed, but only abnormal results are displayed) Labs Reviewed  BASIC METABOLIC PANEL - Abnormal; Notable for the following components:      Result Value   Glucose, Bld 110 (*)    All other components within normal limits  CBC  TROPONIN I (HIGH SENSITIVITY)  TROPONIN I (HIGH SENSITIVITY)  TROPONIN I (HIGH SENSITIVITY)   Results for orders placed or performed during the hospital encounter of 03/49/17  Basic metabolic panel  Result Value Ref Range   Sodium 139 135 - 145 mmol/L   Potassium 3.5 3.5 - 5.1 mmol/L   Chloride 104 98 - 111 mmol/L   CO2 25 22 - 32 mmol/L   Glucose, Bld  110 (H) 70 - 99 mg/dL   BUN 10 8 - 23 mg/dL   Creatinine, Ser 0.60 0.44 - 1.00 mg/dL   Calcium 9.5 8.9 - 10.3 mg/dL   GFR calc non Af Amer >60 >60 mL/min   GFR calc Af Amer >60 >60 mL/min   Anion gap 10 5 - 15  CBC  Result Value Ref Range   WBC 8.8 4.0 - 10.5 K/uL   RBC 4.50 3.87 - 5.11 MIL/uL   Hemoglobin 14.4 12.0 - 15.0 g/dL   HCT 44.7 36.0 - 46.0 %   MCV 99.3 80.0 - 100.0 fL   MCH 32.0 26.0 - 34.0 pg   MCHC 32.2 30.0 - 36.0 g/dL    RDW 12.7 11.5 - 15.5 %   Platelets 192 150 - 400 K/uL   nRBC 0.0 0.0 - 0.2 %  Troponin I (High Sensitivity)  Result Value Ref Range   Troponin I (High Sensitivity) 2 <18 ng/L  Troponin I (High Sensitivity)  Result Value Ref Range   Troponin I (High Sensitivity) 2 <18 ng/L     EKG EKG Interpretation  Date/Time:  Wednesday March 12 2019 11:44:36 EST Ventricular Rate:  87 PR Interval:    QRS Duration: 89 QT Interval:  369 QTC Calculation: 444 R Axis:   105 Text Interpretation: Right and left arm electrode reversal, interpretation assumes no reversal Sinus rhythm Probable lateral infarct, old Baseline wander in lead(s) III aVL Nonspecific ST abnormality Confirmed by Ezequiel Essex [phone]) on 03/12/2019 3:44:21 PM   Radiology Dg Chest 2 View  Result Date: 03/12/2019 CLINICAL DATA:  Pain. Patient reports intermittent left-sided chest pain for 1 month. EXAM: CHEST - 2 VIEW COMPARISON:  Chest CT 11/30/2014. FINDINGS: The cardiomediastinal contours are normal. Atherosclerosis of the aortic arch. Chronic hyperinflation and emphysema. Biapical pleuroparenchymal scarring again seen. Pulmonary vasculature is normal. No consolidation, pleural effusion, or pneumothorax. No acute osseous abnormalities are seen. IMPRESSION: 1. No acute abnormality. 2. Emphysema and biapical pleuroparenchymal scarring. Aortic Atherosclerosis (ICD10-I70.0) and Emphysema (ICD10-J43.9). Electronically Signed   By: Keith Rake M.D.   On: 03/12/2019 12:34    Procedures Procedures (including critical care time)  Medications Ordered in ED Medications  0.9 %  sodium chloride infusion ( Intravenous New Bag/Given 03/12/19 1403)  iohexol (OMNIPAQUE) 350 MG/ML injection 100 mL (100 mLs Intravenous Contrast Given 03/12/19 1546)     Initial Impression / Assessment and Plan / ED Course  I have reviewed the triage vital signs and the nursing notes.  Pertinent labs & imaging results that were available during my care  of the patient were reviewed by me and considered in my medical decision making (see chart for details).       Chest pain very chest wall in nature.  Somewhat pleuritic in nature.  Will do CT angio to rule out PE even though symptoms have been present for about a month.  Troponins x2 were negative EKG without acute changes.  Initial chest x-ray negative.  If CT angio negative patient can be discharged home chest wall diagnosis.  And follow up with her new primary care doctor in [place].  Would also consider giving a referral to cardiology.  Final Clinical Impressions(s) / ED Diagnoses   Final diagnoses:  None    ED Discharge Orders    None       Fredia Sorrow, MD 03/12/19 [phone]

## 2019-03-12 NOTE — ED Triage Notes (Signed)
Pt presents to ED with complaints of intermittent left sided chest pain x 1 month. Pt states the pain is under her left breast and radiates to her back and left shoulder, feels like pressure. Pt denies SOB, N/V. Pt states she has had some lightheadness with it.

## 2019-03-12 NOTE — Discharge Instructions (Addendum)
Commend follow back up with your primary care doctor as well as follow-up with cardiology.  Work-up here today cardiac heart markers without evidence of any acute heart problem.  Chest x-ray negative.  EKG without acute changes labs without significant abnormalities.

## 2019-05-29 ENCOUNTER — Encounter (HOSPITAL_COMMUNITY): Payer: Self-pay | Admitting: Psychiatry

## 2019-05-29 ENCOUNTER — Other Ambulatory Visit: Payer: Self-pay

## 2019-05-29 ENCOUNTER — Ambulatory Visit (INDEPENDENT_AMBULATORY_CARE_PROVIDER_SITE_OTHER): Payer: Medicare HMO | Admitting: Psychiatry

## 2019-05-29 DIAGNOSIS — F331 Major depressive disorder, recurrent, moderate: Secondary | ICD-10-CM | POA: Diagnosis not present

## 2019-05-29 MED ORDER — SERTRALINE HCL 50 MG PO TABS: 50.0000 mg | ORAL_TABLET | Freq: Two times a day (BID) | ORAL | 2 refills | Status: DC

## 2019-05-29 MED ORDER — ALPRAZOLAM 0.5 MG PO TABS: 0.5000 mg | ORAL_TABLET | Freq: Four times a day (QID) | ORAL | 2 refills | Status: DC

## 2019-05-29 NOTE — Progress Notes (Signed)
Virtual Visit via Telephone Note  I connected with Rebecca [name] on 05/29/19 at  3:40 PM EST by telephone and verified that I am speaking with the correct person using two identifiers.   I discussed the limitations, risks, security and privacy concerns of performing an evaluation and management service by telephone and the availability of in person appointments. I also discussed with the patient that there may be a patient responsible charge related to this service. The patient expressed understanding and agreed to proceed    I discussed the assessment and treatment plan with the patient. The patient was provided an opportunity to ask questions and all were answered. The patient agreed with the plan and demonstrated an understanding of the instructions.   The patient was advised to call back or seek an in-person evaluation if the symptoms worsen or if the condition fails to improve as anticipated.  I provided 15 minutes of non-face-to-face time during this encounter.   [name], MD  [facility] MD/PA/NP OP Progress Note  05/29/2019 4:02 PM STEPHANIEMARIE [name]  MRN:  [id number]  Chief Complaint:  Chief Complaint    Depression; Anxiety; Follow-up     HPI: This patient is a 69 year old white female who lives with her husband and mother-in-law in [place].  She is a retired [employer]ar[employer] manager.  The patient returns for follow-up today regarding depression and anxiety.  She states that since I last saw her she had an episode of chest pain and went to the emergency room.  She was referred to a cardiologist and has had echocardiogram and other studies which are mostly normal.  They did find that her LDL cholesterol was a bit elevated and she is now on a statin and she is also on metoprolol for hypertension.  She states however that her mood is good and she has no significant anxiety or panic attacks.  However she really has not been going out much because of the coronavirus pandemic and the weather.  She is sleeping  well. Visit Diagnosis:    ICD-10-CM   1. Major depressive disorder, recurrent episode, moderate (HCC)  F33.1     Past Psychiatric History: 2 prior psychiatric hospitalizations for depression  Past Medical History:  Past Medical History:  Diagnosis Date  . Allergy   . Amenia   . Anxiety   . Arthritis   . Chronic back pain   . Chronic kidney disease    cyst on bil kidneys  . Colitis, ischemic ([place])   . Colon polyp   . Complication of anesthesia   . COPD (chronic obstructive pulmonary disease) ([place])   . Coronary artery disease   . Depression   . Dysphagia   . Fibromyalgia   . GERD (gastroesophageal reflux disease)   . Hiatal hernia   . Hyperlipidemia   . Hypertension   . IBS (irritable bowel syndrome)   . Myocardial infarction ([place])   . 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 Peripheral neuropathy   . Pneumonia   . PONV (postoperative nausea and vomiting)   . Schatzki's ring   . Sciatica   . Sensorineural hearing loss   . Stroke ()    T.I. Colitis  . Tinnitus   . UTI (urinary tract infection)     Past Surgical History:  Procedure Laterality Date  . ABDOMINAL HYSTERECTOMY    . BACK SURGERY    . BLADDER SURGERY    . BREAST SURGERY    . carple tunnal  surgery  Right hand  . CHOLECYSTECTOMY N/A 03/26/2018   Procedure: LAPAROSCOPIC CHOLECYSTECTOMY WITH ATTEMPTED INTRAOPERATIVE CHOLANGIOGRAM;  Surgeon: Donnie Mesa, MD;  Location: [place];  Service: General;  Laterality: N/A;  . COLONOSCOPY  09/10/2005   in [place]  . DILATION AND CURETTAGE OF UTERUS    . NECK SURGERY     cyst on neck  . pillonidal cyst     resection  . TUBAL LIGATION    . UPPER GI ENDOSCOPY  05/05/2011    Family Psychiatric History:see below   Family History:  Family History  Problem Relation Age of Onset  . Alcohol abuse Father   . Depression Father   . Cancer Father        lung  . Cancer Paternal Grandfather        lung  . Osteoporosis Maternal Grandmother   .  Kyphosis Maternal Grandmother   . Stroke Maternal Grandfather   . Osteoporosis Mother   . Kyphosis Mother   . Stroke Mother   . Heart disease Mother   . Kidney disease Mother   . Sleep apnea Mother        said used oxygen at night   . Arthritis Brother   . Arthritis Brother   . Scoliosis Daughter   . Scoliosis Son   . Colon cancer Maternal Uncle   . Diabetes Maternal Uncle   . Irritable bowel syndrome Maternal Uncle   . Esophageal cancer Neg Hx   . Stomach cancer Neg Hx     Social History:  Social History   Socioeconomic History  . Marital status: Married    Spouse name: Not on file  . Number of children: 2  . Years of education: Not on file  . Highest education level: Not on file  Occupational History  . Occupation: CNA  Tobacco Use  . Smoking status: Current Every Day Smoker    Packs/day: 1.00    Years: 44.00    Pack years: 44.00    Types: Cigarettes  . Smokeless tobacco: Never Used  . Tobacco comment: smokes 5 cig daily  Substance and Sexual Activity  . Alcohol use: No    Alcohol/week: 0.0 standard drinks  . Drug use: No  . Sexual activity: Yes    Birth control/protection: Surgical    Comment: hyst  Other Topics Concern  . Not on file  Social History Narrative  . Not on file   Social Determinants of Health   Financial Resource Strain:   . Difficulty of Paying Living Expenses: Not on file  Food Insecurity:   . Worried About Charity fundraiser in the Last Year: Not on file  . Ran Out of Food in the Last Year: Not on file  Transportation Needs:   . Lack of Transportation (Medical): Not on file  . Lack of Transportation (Non-Medical): Not on file  Physical Activity:   . Days of Exercise per Week: Not on file  . Minutes of Exercise per Session: Not on file  Stress:   . Feeling of Stress : Not on file  Social Connections:   . Frequency of Communication with Friends and Family: Not on file  . Frequency of Social Gatherings with Friends and Family: Not  on file  . Attends Religious Services: Not on file  . Active Member of Clubs or Organizations: Not on file  . Attends Archivist Meetings: Not on file  . Marital Status: Not on file    Allergies:  Allergies  Allergen Reactions  .  Bupropion Hcl Swelling  . Doxycycline Other (See Comments)    When  she is on Valium   . Simvastatin Other (See Comments)    Sleepy and weak  . Toradol [Ketorolac Tromethamine] Other (See Comments)    Causes gi bleeding    . Cephalexin Other (See Comments)    Burning with urination  . Darvocet [Propoxyphene N-Acetaminophen] Anxiety  . Penicillins Swelling and Rash    Has patient had a PCN reaction causing immediate rash, facial/tongue/throat swelling, SOB or lightheadedness with hypotension: No Has patient had a PCN reaction causing severe rash involving mucus membranes or skin necrosis: No Has patient had a PCN reaction that required hospitalization: No Has patient had a PCN reaction occurring within the last 10 years: No If all of the above answers are "NO", then may proceed with Cephalosporin use.     Metabolic Disorder Labs: No results found for: HGBA1C, MPG No results found for: PROLACTIN Lab Results  Component Value Date   CHOL 235 (H) 08/28/2008   TRIG 147 08/28/2008   HDL 52 08/28/2008   CHOLHDL 4.5 Ratio 08/28/2008   VLDL 29 08/28/2008   LDLCALC 154 (H) 08/28/2008   Lab Results  Component Value Date   TSH 1.511 08/28/2008   TSH 0.802 08/16/2007    Therapeutic Level Labs: No results found for: LITHIUM No results found for: VALPROATE No components found for:  CBMZ  Current Medications: Current Outpatient Medications  Medication Sig Dispense Refill  . metoprolol tartrate (LOPRESSOR) 25 MG tablet Take by mouth.    . pravastatin (PRAVACHOL) 20 MG tablet Take by mouth.    Marland Kitchen acetaminophen (TYLENOL) 500 MG tablet Take 500 mg by mouth every 6 (six) hours as needed for moderate pain.    Marland Kitchen albuterol (PROVENTIL) (2.5 MG/3ML)  0.083% nebulizer solution Take 2.5 mg by nebulization every 6 (six) hours as needed for wheezing or shortness of breath.    . ALPRAZolam (XANAX) 0.5 MG tablet Take 1 tablet (0.5 mg total) by mouth 4 (four) times daily. 120 tablet 2  . betamethasone dipropionate (DIPROLENE) 0.05 % cream Apply 1 application topically 2 (two) times daily as needed (rash).     . cholestyramine (QUESTRAN) 4 GM/DOSE powder TAKE 4 GRAMS BY MOUTH MIXED IN WATER OF JUICE 3 TIMES DAILY WITH MEALS    . conjugated estrogens (PREMARIN) vaginal cream Place 1 Applicatorful vaginally daily.     . Cyanocobalamin (VITAMIN B-12 IJ) Inject 1,000 mcg as directed every 30 (thirty) days.     . diclofenac Sodium (VOLTAREN) 1 % GEL PLACE ONTO THE SKIN 4 (FOUR) TIMES A DAY AS NEEDED. APPLY 4G TO SITE AS DIRECTED    . ezetimibe (ZETIA) 10 MG tablet TAKE ONE TABLET (10 MG DOSE) BY MOUTH DAILY.    . fluticasone (FLONASE) 50 MCG/ACT nasal spray Place 1 spray into both nostrils daily.     Marland Kitchen HYDROcodone-acetaminophen (NORCO/VICODIN) 5-325 MG tablet Take 1 tablet by mouth every 6 (six) hours as needed for moderate pain. 20 tablet 0  . hydrOXYzine (ATARAX/VISTARIL) 25 MG tablet TAKE ONE TABLET (25 MG DOSE) BY MOUTH EVERY 6 (SIX) HOURS AS NEEDED FOR ITCHING.    . montelukast (SINGULAIR) 10 MG tablet Take 10 mg by mouth daily as needed (allergies).     . nicotine (NICODERM CQ - DOSED IN MG/24 HR) 7 mg/24hr patch Place 7 mg onto the skin daily.    . promethazine (PHENERGAN) 25 MG tablet Take 25 mg by mouth every 6 (six) hours as  needed for nausea or vomiting.    . ranitidine (ZANTAC) 150 MG tablet Take 150 mg by mouth at bedtime.  5  . sertraline (ZOLOFT) 50 MG tablet Take 1 tablet (50 mg total) by mouth 2 (two) times daily. 180 tablet 2  . sodium chloride (OCEAN) 0.65 % SOLN nasal spray Place 1 spray into both nostrils as needed for congestion.    . triamcinolone ointment (KENALOG) 0.5 % Apply 1 application topically 2 (two) times daily as needed  (irritation).    Marland Kitchen umeclidinium-vilanterol (ANORO ELLIPTA) 62.5-25 MCG/INH AEPB Inhale 1 puff into the lungs daily as needed (shortness of breath).     No current facility-administered medications for this visit.     Musculoskeletal: Strength & Muscle Tone: within normal limits Gait & Station: normal Patient leans: N/A  Psychiatric Specialty Exam: Review of Systems  Musculoskeletal: Positive for arthralgias.  All other systems reviewed and are negative.   There were no vitals taken for this visit.There is no height or weight on file to calculate BMI.  General Appearance: NA  Eye Contact:  NA  Speech:  Clear and Coherent  Volume:  Normal  Mood:  Euthymic  Affect:  NA  Thought Process:  Goal Directed  Orientation:  Full (Time, Place, and Person)  Thought Content: WDL   Suicidal Thoughts:  No  Homicidal Thoughts:  No  Memory:  Immediate;   Good Recent;   Good Remote;   Good  Judgement:  Good  Insight:  Fair  Psychomotor Activity:  Normal  Concentration:  Concentration: Good and Attention Span: Good  Recall:  Good  Fund of Knowledge: Fair  Language: Good  Akathisia:  No  Handed:  Right  AIMS (if indicated): not done  Assets:  Communication Skills Desire for Improvement Resilience Social Support Talents/Skills  ADL's:  Intact  Cognition: WNL  Sleep:  Good   Screenings: PHQ2-9     Office Visit from 04/25/2017 in [facility]  PHQ-2 Total Score  0       Assessment and Plan: This patient is a 69 year old female with a history of depression and anxiety.  She continues to do well on her current regimen.  She will continue Zoloft 50 mg twice daily for depression and panic attacks and Xanax 0.5 mg 4 times daily for anxiety.  She will return to see me in 3 months   [name], MD 05/29/2019, 4:02 PM

## 2019-08-14 ENCOUNTER — Other Ambulatory Visit: Payer: Self-pay

## 2019-08-14 ENCOUNTER — Ambulatory Visit: Payer: Medicare HMO | Attending: Family Medicine | Admitting: Physical Therapy

## 2019-08-14 ENCOUNTER — Encounter: Payer: Self-pay | Admitting: Physical Therapy

## 2019-08-14 DIAGNOSIS — M6281 Muscle weakness (generalized): Secondary | ICD-10-CM | POA: Diagnosis not present

## 2019-08-14 NOTE — Therapy (Addendum)
[facility] [place], [place], [address] Phone: [phone]   Fax:  [phone]  Physical Therapy Evaluation  Patient Details  Name: Rebecca [name] MRN: [id number] Date of Birth: January 19, 1951 Referring Provider (PT): Ledora Bottcher, MD   Encounter Date: 08/14/2019  PT End of Session - 08/14/19 1730    Visit Number  1    Number of Visits  12    Date for PT Re-Evaluation  10/02/19    PT Start Time  1430    PT Stop Time  1513    PT Time Calculation (min)  43 min    Activity Tolerance  Patient tolerated treatment well    Behavior During Therapy  [facility] for tasks assessed/performed       Past Medical History:  Diagnosis Date  . Allergy   . Amenia   . Anxiety   . Arthritis   . Chronic back pain   . Chronic kidney disease    cyst on bil kidneys  . Colitis, ischemic ([place])   . Colon polyp   . Complication of anesthesia   . COPD (chronic obstructive pulmonary disease) ([place])   . Coronary artery disease   . Depression   . Dysphagia   . Fibromyalgia   . GERD (gastroesophageal reflux disease)   . Hiatal hernia   . Hyperlipidemia   . Hypertension   . IBS (irritable bowel syndrome)   . Myocardial infarction ([place])   . 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 Peripheral neuropathy   . Pneumonia   . PONV (postoperative nausea and vomiting)   . Schatzki's ring   . Sciatica   . Sensorineural hearing loss   . Stroke ([place])    T.I. Colitis  . Tinnitus   . UTI (urinary tract infection)     Past Surgical History:  Procedure Laterality Date  . ABDOMINAL HYSTERECTOMY    . BACK SURGERY    . BLADDER SURGERY    . BREAST SURGERY    . carple tunnal  surgery     Right hand  . CHOLECYSTECTOMY N/A 03/26/2018   Procedure: LAPAROSCOPIC CHOLECYSTECTOMY WITH ATTEMPTED INTRAOPERATIVE CHOLANGIOGRAM;  Surgeon: Donnie Mesa, MD;  Location: [place];  Service: General;  Laterality: N/A;  . COLONOSCOPY  09/10/2005   in [place]   . DILATION AND CURETTAGE OF UTERUS    . NECK SURGERY     cyst on neck  . pillonidal cyst     resection  . TUBAL LIGATION    . UPPER GI ENDOSCOPY  05/05/2011    There were no vitals filed for this visit.   Subjective Assessment - 08/14/19 2036    Subjective  COVID-19 screening performed upon arrival. Patient arrives with reports of both UE and LE weakness that has been ongoing but exacerbated in the past few weeks. Patient reports increased difficulty with tasks but is able to complete with increased time. Patient reports more difficulties with stair negotiation due to weakness. Patient denies any falls due to weakness. Patient's goals are to improve strength improve movement and improve ability to perform home tasks and caregiving tasks to family.    Pertinent History  fibromyalgia, osteopenia, chronic back pain    Limitations  Lifting;Standing;Walking;House hold activities    Patient Stated Goals  move better    Currently in Pain?  No/denies         [facility] PT Assessment - 08/14/19 0001      Assessment   Medical Diagnosis  Generalized weakness    Referring Provider (PT)  Ledora Bottcher, MD    Onset Date/Surgical Date  --   ongoing   Hand Dominance  Right    Next MD Visit  June 2021    Prior Therapy  yes, for back      Precautions   Precautions  None      Restrictions   Weight Bearing Restrictions  No      Balance Screen   Has the patient fallen in the past 6 months  No    Has the patient had a decrease in activity level because of a fear of falling?   No    Is the patient reluctant to leave their home because of a fear of falling?   No      Home Film/video editor residence    Living Arrangements  Spouse/significant other      Prior Function   Level of Independence  Independent      ROM / Strength   AROM / PROM / Strength  Strength;AROM      AROM   Overall AROM Comments  WFL bilateral shoulders AROM      Strength   Strength Assessment  Site  Shoulder;Hip;Knee    Right/Left Shoulder  Right;Left    Right Shoulder Flexion  3+/5    Right Shoulder ABduction  3/5    Right Shoulder Internal Rotation  4-/5    Right Shoulder External Rotation  4-/5    Left Shoulder Flexion  3+/5    Left Shoulder ABduction  3/5    Left Shoulder Internal Rotation  4-/5    Left Shoulder External Rotation  4-/5    Right/Left Hip  Right;Left    Right Hip Flexion  3+/5    Left Hip Flexion  3+/5    Right/Left Knee  Right;Left    Right Knee Flexion  4-/5    Right Knee Extension  4/5    Left Knee Flexion  4-/5    Left Knee Extension  4/5      Transfers   Five time sit to stand comments   32.62 seconds, modified with UE support on thighs      Ambulation/Gait   Gait Pattern  Step-through pattern;Decreased stride length    Gait velocity  decreased gait speed, cautious gait                Objective measurements completed on examination: See above findings.              PT Education - 08/14/19 2023    Education Details  scapular retractions, wall wash, sit to stand, clam shell    Person(s) Educated  Patient    Methods  Explanation;Demonstration;Handout    Comprehension  Verbalized understanding;Returned demonstration          PT Long Term Goals - 08/14/19 2104      PT LONG TERM GOAL #1   Title  Patient will be independent with HEP    Time  6    Period  Weeks    Status  New      PT LONG TERM GOAL #2   Title  Patient will improve bilateral UE and LE strength to 4/5 or greater to improve stability during functional activities.    Time  6    Period  Weeks    Status  New      PT LONG TERM GOAL #3   Title  Patient will perform  modified 5x sit to stand test in 25 seconds or less to improve LE strength and decrease risk for falls.    Time  6    Period  Weeks    Status  New      PT LONG TERM GOAL #4   Title  Patient will demonstrate step to step stair negotiation with one railing and no reports of weakness or knees  giving way.    Time  6    Period  Weeks    Status  New      PT LONG TERM GOAL #5   Title  Patient will demonstrate improved muscular endurance as noted by the ability to perform nustep at level 5 with 60+ steps per minute.    Time  6    Period  Weeks    Status  New             Plan - 08/14/19 2049    Clinical Impression Statement  Patient is a 69 year old right handed female who presents to physical therapy with bilateral UE and LE weakness. Patient's modified 5x sit to stand with use of UEs time of 32.63 seconds categorizes her as a fall risk. Patient and PT discussed plan of care as well as HEP to maximize physical therapy benefit. Patient reported understanding. Patient would benefit from skilled physical therapy to address deficits and address patient's goals.    Personal Factors and Comorbidities  Comorbidity 2    Comorbidities  fibromyalgia, osteopenia, chronic back pain.    Examination-Activity Limitations  Caring for Others;Carry;Stand;Stairs;Squat;Transfers    Stability/Clinical Decision Making  Stable/Uncomplicated    Clinical Decision Making  Low    Rehab Potential  Fair    PT Frequency  2x / week    PT Duration  6 weeks    PT Treatment/Interventions  ADLs/Self Care Home Management;Patient/family education;Gait training;Stair training;Functional mobility training;Therapeutic activities;Therapeutic exercise;Balance training;Passive range of motion;Neuromuscular re-education;Electrical Stimulation;Cryotherapy;Moist Heat    PT Next Visit Plan  Nustep, UE and LE strengthening, core stabilization and balance activities.    Consulted and Agree with Plan of Care  Patient       Patient will benefit from skilled therapeutic intervention in order to improve the following deficits and impairments:  Difficulty walking, Decreased range of motion, Decreased activity tolerance, Decreased balance, Decreased strength, Impaired UE functional use  Visit Diagnosis: Muscle weakness  (generalized) - Plan: PT plan of care cert/re-cert     Problem List Patient Active Problem List   Diagnosis Date Noted  . 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 BACK STRAIN 03/17/2010  . HEMATURIA UNSPECIFIED 02/24/2010  . Dyspareunia 02/24/2010  . OTHER SPEC HYPERTROPHIC&ATROPHIC CONDITION SKIN 02/24/2010  . DYSURIA 11/20/2008  . HYPERLIPIDEMIA 10/30/2008  . ALLERGIC RHINITIS 12/26/2007  . HEADACHE 11/27/2007  . RESTLESS LEG SYNDROME 10/03/2007  . CONSTIPATION 10/03/2007  . [place], FEMALE 10/03/2007  . 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 ANXIETY DEPRESSION 08/01/2007  . TOBACCO ABUSE 08/01/2007  . ISCHEMIC COLITIS 08/01/2007  . [place] DISEASE, LUMBOSACRAL SPINE 08/01/2007    Gabriela Eves, PT, DPT 08/14/2019, 9:16 PM  [facility] [address] [place], [place], [address] Phone: [phone]   Fax:  [phone]  Name: Rebecca [name] MRN: [id number] Date of Birth: [birth year]

## 2019-08-14 NOTE — Addendum Note (Signed)
Addended by: Gabriela Eves on: 08/14/2019 09:16 PM   Modules accepted: Orders

## 2019-08-19 ENCOUNTER — Ambulatory Visit: Payer: Medicare HMO | Admitting: Physical Therapy

## 2019-08-21 ENCOUNTER — Other Ambulatory Visit: Payer: Self-pay

## 2019-08-21 ENCOUNTER — Ambulatory Visit: Payer: Medicare HMO | Admitting: Physical Therapy

## 2019-08-21 ENCOUNTER — Encounter: Payer: Self-pay | Admitting: Physical Therapy

## 2019-08-21 DIAGNOSIS — M6281 Muscle weakness (generalized): Secondary | ICD-10-CM | POA: Diagnosis not present

## 2019-08-21 NOTE — Therapy (Signed)
[facility] [place], [place], [address] Phone: [phone]   Fax:  [phone]  Physical Therapy Treatment  Patient Details  Name: Rebecca [name] MRN: [id number] Date of Birth: [birth year] Referring Provider (PT): Ledora Bottcher, MD   Encounter Date: 08/21/2019  PT End of Session - 08/21/19 1524    Visit Number  2    Number of Visits  12    Date for PT Re-Evaluation  10/02/19    PT Start Time  1519    PT Stop Time  1606    PT Time Calculation (min)  47 min    Activity Tolerance  Patient tolerated treatment well    Behavior During Therapy  [facility] for tasks assessed/performed       Past Medical History:  Diagnosis Date  . Allergy   . Amenia   . Anxiety   . Arthritis   . Chronic back pain   . Chronic kidney disease    cyst on bil kidneys  . Colitis, ischemic ([place])   . Colon polyp   . Complication of anesthesia   . COPD (chronic obstructive pulmonary disease) ([place])   . Coronary artery disease   . Depression   . Dysphagia   . Fibromyalgia   . GERD (gastroesophageal reflux disease)   . Hiatal hernia   . Hyperlipidemia   . Hypertension   . IBS (irritable bowel syndrome)   . Myocardial infarction ([place])   . 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 Peripheral neuropathy   . Pneumonia   . PONV (postoperative nausea and vomiting)   . Schatzki's ring   . Sciatica   . Sensorineural hearing loss   . Stroke ([place])    T.I. Colitis  . Tinnitus   . UTI (urinary tract infection)     Past Surgical History:  Procedure Laterality Date  . ABDOMINAL HYSTERECTOMY    . BACK SURGERY    . BLADDER SURGERY    . BREAST SURGERY    . carple tunnal  surgery     Right hand  . CHOLECYSTECTOMY N/A 03/26/2018   Procedure: LAPAROSCOPIC CHOLECYSTECTOMY WITH ATTEMPTED INTRAOPERATIVE CHOLANGIOGRAM;  Surgeon: Donnie Mesa, MD;  Location: [place];  Service: General;  Laterality: N/A;  . COLONOSCOPY  09/10/2005   in [place]   . DILATION AND CURETTAGE OF UTERUS    . NECK SURGERY     cyst on neck  . pillonidal cyst     resection  . TUBAL LIGATION    . UPPER GI ENDOSCOPY  05/05/2011    There were no vitals filed for this visit.  Subjective Assessment - 08/21/19 1522    Subjective  COVID 19 screening performed on patient upon arrival. Patient reports that she is no more in pain or fatigued than normal.    Pertinent History  fibromyalgia, osteopenia, chronic back pain    Limitations  Lifting;Standing;Walking;House hold activities    Patient Stated Goals  move better    Currently in Pain?  No/denies         [facility] PT Assessment - 08/21/19 0001      Assessment   Medical Diagnosis  Generalized weakness    Referring Provider (PT)  Ledora Bottcher, MD    Hand Dominance  Right    Next MD Visit  June 2021    Prior Therapy  yes, for back      Precautions   Precautions  None      Restrictions   Weight  Bearing Restrictions  No                    [facility] Adult PT Treatment/Exercise - 08/21/19 0001      Exercises   Exercises  Shoulder;Lumbar;Knee/Hip      Knee/Hip Exercises: Aerobic   Nustep  L4 x15 min      Knee/Hip Exercises: Standing   Heel Raises  Both;2 sets;10 reps    Heel Raises Limitations  B toe raise 2x10 reps    Hip Flexion  AROM;Both;2 sets;10 reps;Knee bent    Hip Abduction  AROM;Both;20 reps;Knee straight      Knee/Hip Exercises: Seated   Long Arc Quad  Strengthening;Both;2 sets;10 reps;Weights    Long Arc Quad Weight  3 lbs.    Clamshell with TheraBand  Yellow   2x10 reps     Shoulder Exercises: Seated   Horizontal ABduction  Strengthening;Both;20 reps;Theraband    Theraband Level (Shoulder Horizontal ABduction)  Level 1 (Yellow)    External Rotation  Strengthening;Both;20 reps;Theraband    Theraband Level (Shoulder External Rotation)  Level 1 (Yellow)      Shoulder Exercises: Standing   Retraction  Strengthening;Both;20 reps;Limitations    Retraction Limitations   Green XTS      Shoulder Exercises: ROM/Strengthening   UBE (Upper Arm Bike)  120 RPM x8 min (forward/backward)                  PT Long Term Goals - 08/21/19 1645      PT LONG TERM GOAL #1   Title  Patient will be independent with HEP    Time  6    Period  Weeks    Status  Achieved      PT LONG TERM GOAL #2   Title  Patient will improve bilateral UE and LE strength to 4/5 or greater to improve stability during functional activities.    Time  6    Period  Weeks    Status  On-going      PT LONG TERM GOAL #3   Title  Patient will perform modified 5x sit to stand test in 25 seconds or less to improve LE strength and decrease risk for falls.    Time  6    Period  Weeks    Status  On-going      PT LONG TERM GOAL #4   Title  Patient will demonstrate step to step stair negotiation with one railing and no reports of weakness or knees giving way.    Time  6    Period  Weeks    Status  On-going      PT LONG TERM GOAL #5   Title  Patient will demonstrate improved muscular endurance as noted by the ability to perform nustep at level 5 with 60+ steps per minute.    Time  6    Period  Weeks    Status  On-going            Plan - 08/21/19 1634    Clinical Impression Statement  Patient presented in clinic with reports of history of depression and lack of activity due to health issues. Patient limited by pain and fatigue/weakness with ADLs. Patient guided through therex with VCs for posture and technique correction. Patient reports HEP compliance as well.    Personal Factors and Comorbidities  Comorbidity 2    Comorbidities  fibromyalgia, osteopenia, chronic back pain.    Examination-Activity Limitations  Caring for Others;Carry;Stand;Stairs;Squat;Transfers  Stability/Clinical Decision Making  Stable/Uncomplicated    Rehab Potential  Fair    PT Frequency  2x / week    PT Duration  6 weeks    PT Treatment/Interventions  ADLs/Self Care Home Management;Patient/family  education;Gait training;Stair training;Functional mobility training;Therapeutic activities;Therapeutic exercise;Balance training;Passive range of motion;Neuromuscular re-education;Electrical Stimulation;Cryotherapy;Moist Heat    PT Next Visit Plan  Nustep, UE and LE strengthening, core stabilization and balance activities.    Consulted and Agree with Plan of Care  Patient       Patient will benefit from skilled therapeutic intervention in order to improve the following deficits and impairments:  Difficulty walking, Decreased range of motion, Decreased activity tolerance, Decreased balance, Decreased strength, Impaired UE functional use  Visit Diagnosis: Muscle weakness (generalized)     Problem List Patient Active Problem List   Diagnosis Date Noted  . 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 BACK STRAIN 03/17/2010  . HEMATURIA UNSPECIFIED 02/24/2010  . Dyspareunia 02/24/2010  . OTHER SPEC HYPERTROPHIC&ATROPHIC CONDITION SKIN 02/24/2010  . DYSURIA 11/20/2008  . HYPERLIPIDEMIA 10/30/2008  . ALLERGIC RHINITIS 12/26/2007  . HEADACHE 11/27/2007  . RESTLESS LEG SYNDROME 10/03/2007  . CONSTIPATION 10/03/2007  . [place], FEMALE 10/03/2007  . 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 ANXIETY DEPRESSION 08/01/2007  . TOBACCO ABUSE 08/01/2007  . ISCHEMIC COLITIS 08/01/2007  . [place] DISEASE, LUMBOSACRAL SPINE 08/01/2007    Standley Brooking, PTA 08/21/2019, 4:45 PM  [facility] [address] [place], [place], [address] Phone: [phone]   Fax:  [phone]  Name: Rebecca [name] MRN: [id number] Date of Birth: 01-26-1951

## 2019-08-25 ENCOUNTER — Encounter (HOSPITAL_COMMUNITY): Payer: Self-pay | Admitting: Psychiatry

## 2019-08-25 ENCOUNTER — Telehealth (INDEPENDENT_AMBULATORY_CARE_PROVIDER_SITE_OTHER): Payer: Medicare HMO | Admitting: Psychiatry

## 2019-08-25 ENCOUNTER — Other Ambulatory Visit: Payer: Self-pay

## 2019-08-25 DIAGNOSIS — F331 Major depressive disorder, recurrent, moderate: Secondary | ICD-10-CM | POA: Diagnosis not present

## 2019-08-25 MED ORDER — SERTRALINE HCL 50 MG PO TABS: 50.0000 mg | ORAL_TABLET | Freq: Two times a day (BID) | ORAL | 2 refills | Status: DC

## 2019-08-25 MED ORDER — ALPRAZOLAM 0.5 MG PO TABS: 0.5000 mg | ORAL_TABLET | Freq: Four times a day (QID) | ORAL | 2 refills | Status: DC

## 2019-08-25 NOTE — Progress Notes (Signed)
Virtual Visit via Video Note  I connected with Rebecca [name] on 08/25/19 at  1:00 PM EDT by a video enabled telemedicine application and verified that I am speaking with the correct person using two identifiers.   I discussed the limitations of evaluation and management by telemedicine and the availability of in person appointments. The patient expressed understanding and agreed to proceed.    I discussed the assessment and treatment plan with the patient. The patient was provided an opportunity to ask questions and all were answered. The patient agreed with the plan and demonstrated an understanding of the instructions.   The patient was advised to call back or seek an in-person evaluation if the symptoms worsen or if the condition fails to improve as anticipated.  I provided 15 minutes of non-face-to-face time during this encounter.   [name], MD  [facility] MD/PA/NP OP Progress Note  08/25/2019 1:20 PM Rebecca [name]  MRN:  [id number]  Chief Complaint:  Chief Complaint    Depression; Anxiety; Follow-up     HPI: This patient is a 69 year old white female who lives with her husband and mother-in-law in [place].  She is a retired [employer] manager.  The patient returns for follow-up regarding her depression and anxiety.  She is currently getting physical therapy because of generalized weakness.  She is getting B12 shots as well and both of these seem to be helping.  She states that her mood has generally been good although she gets "depressed" about having to go to so many doctor visits.  She is not having significant anxiety or panic attacks.  She has gotten vaccinated and is getting out a little bit more.  She does not always have good luck getting to sleep so she wants to try melatonin I think this is fine. Visit Diagnosis:    ICD-10-CM   1. Major depressive disorder, recurrent episode, moderate (HCC)  F33.1     Past Psychiatric History: 2 prior psychiatric hospitalizations for  depression  Past Medical History:  Past Medical History:  Diagnosis Date  . Allergy   . Amenia   . Anxiety   . Arthritis   . Chronic back pain   . Chronic kidney disease    cyst on bil kidneys  . Colitis, ischemic ([place])   . Colon polyp   . Complication of anesthesia   . COPD (chronic obstructive pulmonary disease) ([place])   . Coronary artery disease   . Depression   . Dysphagia   . Fibromyalgia   . GERD (gastroesophageal reflux disease)   . Hiatal hernia   . Hyperlipidemia   . Hypertension   . IBS (irritable bowel syndrome)   . Myocardial infarction ([place])   . 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 Peripheral neuropathy   . Pneumonia   . PONV (postoperative nausea and vomiting)   . Schatzki's ring   . Sciatica   . Sensorineural hearing loss   . Stroke ([place])    T.I. Colitis  . Tinnitus   . UTI (urinary tract infection)     Past Surgical History:  Procedure Laterality Date  . ABDOMINAL HYSTERECTOMY    . BACK SURGERY    . BLADDER SURGERY    . BREAST SURGERY    . carple tunnal  surgery     Right hand  . CHOLECYSTECTOMY N/A 03/26/2018   Procedure: LAPAROSCOPIC CHOLECYSTECTOMY WITH ATTEMPTED INTRAOPERATIVE CHOLANGIOGRAM;  Surgeon: Donnie Mesa, MD;  Location: [place];  Service: General;  Laterality: N/A;  .  COLONOSCOPY  09/10/2005   in [place]  . DILATION AND CURETTAGE OF UTERUS    . NECK SURGERY     cyst on neck  . pillonidal cyst     resection  . TUBAL LIGATION    . UPPER GI ENDOSCOPY  05/05/2011    Family Psychiatric History: see below  Family History:  Family History  Problem Relation Age of Onset  . Alcohol abuse Father   . Depression Father   . Cancer Father        lung  . Cancer Paternal Grandfather        lung  . Osteoporosis Maternal Grandmother   . Kyphosis Maternal Grandmother   . Stroke Maternal Grandfather   . Osteoporosis Mother   . Kyphosis Mother   . Stroke Mother   . Heart disease Mother   . Kidney disease Mother   .  Sleep apnea Mother        said used oxygen at night   . Arthritis Brother   . Arthritis Brother   . Scoliosis Daughter   . Scoliosis Son   . Colon cancer Maternal Uncle   . Diabetes Maternal Uncle   . Irritable bowel syndrome Maternal Uncle   . Esophageal cancer Neg Hx   . Stomach cancer Neg Hx     Social History:  Social History   Socioeconomic History  . Marital status: Married    Spouse name: Not on file  . Number of children: 2  . Years of education: Not on file  . Highest education level: Not on file  Occupational History  . Occupation: CNA  Tobacco Use  . Smoking status: Current Every Day Smoker    Packs/day: 1.00    Years: 44.00    Pack years: 44.00    Types: Cigarettes  . Smokeless tobacco: Never Used  . Tobacco comment: smokes 5 cig daily  Substance and Sexual Activity  . Alcohol use: No    Alcohol/week: 0.0 standard drinks  . Drug use: No  . Sexual activity: Yes    Birth control/protection: Surgical    Comment: hyst  Other Topics Concern  . Not on file  Social History Narrative  . Not on file   Social Determinants of Health   Financial Resource Strain:   . Difficulty of Paying Living Expenses:   Food Insecurity:   . Worried About Charity fundraiser in the Last Year:   . Arboriculturist in the Last Year:   Transportation Needs:   . Film/video editor (Medical):   Marland Kitchen Lack of Transportation (Non-Medical):   Physical Activity:   . Days of Exercise per Week:   . Minutes of Exercise per Session:   Stress:   . Feeling of Stress :   Social Connections:   . Frequency of Communication with Friends and Family:   . Frequency of Social Gatherings with Friends and Family:   . Attends Religious Services:   . Active Member of Clubs or Organizations:   . Attends Archivist Meetings:   Marland Kitchen Marital Status:     Allergies:  Allergies  Allergen Reactions  . Bupropion Hcl Swelling  . Doxycycline Other (See Comments)    When  she is on Valium    . Simvastatin Other (See Comments)    Sleepy and weak  . Toradol [Ketorolac Tromethamine] Other (See Comments)    Causes gi bleeding    . Cephalexin Other (See Comments)    Burning with urination  .  Darvocet [Propoxyphene N-Acetaminophen] Anxiety  . Penicillins Swelling and Rash    Has patient had a PCN reaction causing immediate rash, facial/tongue/throat swelling, SOB or lightheadedness with hypotension: No Has patient had a PCN reaction causing severe rash involving mucus membranes or skin necrosis: No Has patient had a PCN reaction that required hospitalization: No Has patient had a PCN reaction occurring within the last 10 years: No If all of the above answers are "NO", then may proceed with Cephalosporin use.     Metabolic Disorder Labs: No results found for: HGBA1C, MPG No results found for: PROLACTIN Lab Results  Component Value Date   CHOL 235 (H) 08/28/2008   TRIG 147 08/28/2008   HDL 52 08/28/2008   CHOLHDL 4.5 Ratio 08/28/2008   VLDL 29 08/28/2008   LDLCALC 154 (H) 08/28/2008   Lab Results  Component Value Date   TSH 1.511 08/28/2008   TSH 0.802 08/16/2007    Therapeutic Level Labs: No results found for: LITHIUM No results found for: VALPROATE No components found for:  CBMZ  Current Medications: Current Outpatient Medications  Medication Sig Dispense Refill  . acetaminophen (TYLENOL) 500 MG tablet Take 500 mg by mouth every 6 (six) hours as needed for moderate pain.    Marland Kitchen albuterol (PROVENTIL) (2.5 MG/3ML) 0.083% nebulizer solution Take 2.5 mg by nebulization every 6 (six) hours as needed for wheezing or shortness of breath.    . ALPRAZolam (XANAX) 0.5 MG tablet Take 1 tablet (0.5 mg total) by mouth 4 (four) times daily. 120 tablet 2  . betamethasone dipropionate (DIPROLENE) 0.05 % cream Apply 1 application topically 2 (two) times daily as needed (rash).     . cholestyramine (QUESTRAN) 4 GM/DOSE powder TAKE 4 GRAMS BY MOUTH MIXED IN WATER OF JUICE 3 TIMES  DAILY WITH MEALS    . conjugated estrogens (PREMARIN) vaginal cream Place 1 Applicatorful vaginally daily.     . Cyanocobalamin (VITAMIN B-12 IJ) Inject 1,000 mcg as directed every 30 (thirty) days.     . diclofenac Sodium (VOLTAREN) 1 % GEL PLACE ONTO THE SKIN 4 (FOUR) TIMES A DAY AS NEEDED. APPLY 4G TO SITE AS DIRECTED    . fluticasone (FLONASE) 50 MCG/ACT nasal spray Place 1 spray into both nostrils daily.     . hydrOXYzine (ATARAX/VISTARIL) 25 MG tablet TAKE ONE TABLET (25 MG DOSE) BY MOUTH EVERY 6 (SIX) HOURS AS NEEDED FOR ITCHING.    . metoprolol tartrate (LOPRESSOR) 25 MG tablet Take by mouth.    . montelukast (SINGULAIR) 10 MG tablet Take 10 mg by mouth daily as needed (allergies).     . nicotine (NICODERM CQ - DOSED IN MG/24 HR) 7 mg/24hr patch Place 7 mg onto the skin daily.    . promethazine (PHENERGAN) 25 MG tablet Take 25 mg by mouth every 6 (six) hours as needed for nausea or vomiting.    . ranitidine (ZANTAC) 150 MG tablet Take 150 mg by mouth at bedtime.  5  . sertraline (ZOLOFT) 50 MG tablet Take 1 tablet (50 mg total) by mouth 2 (two) times daily. 180 tablet 2  . sodium chloride (OCEAN) 0.65 % SOLN nasal spray Place 1 spray into both nostrils as needed for congestion.    . triamcinolone ointment (KENALOG) 0.5 % Apply 1 application topically 2 (two) times daily as needed (irritation).    Marland Kitchen umeclidinium-vilanterol (ANORO ELLIPTA) 62.5-25 MCG/INH AEPB Inhale 1 puff into the lungs daily as needed (shortness of breath).     No current facility-administered medications  for this visit.     Musculoskeletal: Strength & Muscle Tone: within normal limits Gait & Station: normal Patient leans: N/A  Psychiatric Specialty Exam: Review of Systems  Neurological: Positive for weakness.  All other systems reviewed and are negative.   There were no vitals taken for this visit.There is no height or weight on file to calculate BMI.  General Appearance: Casual, Neat and Well Groomed  Eye  Contact:  Good  Speech:  Clear and Coherent  Volume:  Normal  Mood:  Euthymic  Affect:  Appropriate and Congruent  Thought Process:  Goal Directed  Orientation:  Full (Time, Place, and Person)  Thought Content: WDL   Suicidal Thoughts:  No  Homicidal Thoughts:  No  Memory:  Immediate;   Good Recent;   Good Remote;   Fair  Judgement:  Good  Insight:  Good  Psychomotor Activity:  Decreased  Concentration:  Concentration: Good and Attention Span: Good  Recall:  Good  Fund of Knowledge: Good  Language: Good  Akathisia:  No  Handed:  Right  AIMS (if indicated): not done  Assets:  Communication Skills Desire for Improvement Resilience Social Support Talents/Skills  ADL's:  Intact  Cognition: WNL  Sleep:  Fair   Screenings: PHQ2-9     [facility] Visit from 04/25/2017 in [facility]  PHQ-2 Total Score  0       Assessment and Plan: This patient is a 69 year old female with a history of depression and anxiety.  She continues to do well on her current regimen.  Since she is having some trouble sleeping I suggested adding melatonin 5 to 10 mg at bedtime.  She will continue Zoloft 50 mg twice daily for depression and panic attacks and Xanax 0.5 mg 4 times daily for anxiety.  She will return to see me in 3 months   [name], MD 08/25/2019, 1:20 PM

## 2019-08-26 ENCOUNTER — Other Ambulatory Visit: Payer: Self-pay

## 2019-08-26 ENCOUNTER — Encounter: Payer: Self-pay | Admitting: Physical Therapy

## 2019-08-26 ENCOUNTER — Ambulatory Visit: Payer: Medicare HMO | Admitting: Physical Therapy

## 2019-08-26 DIAGNOSIS — M6281 Muscle weakness (generalized): Secondary | ICD-10-CM | POA: Diagnosis not present

## 2019-08-26 NOTE — Therapy (Signed)
[facility] [place], [place], [address] Phone: [phone]   Fax:  [phone]  Physical Therapy Treatment  Patient Details  Name: Rebecca [name] MRN: [id number] Date of Birth: 07/12/1950 Referring Provider (PT): Ledora Bottcher, MD   Encounter Date: 08/26/2019  PT End of Session - 08/26/19 1353    Visit Number  3    Number of Visits  12    Date for PT Re-Evaluation  10/02/19    PT Start Time  1353   late arrival into gym   PT Stop Time  1431    PT Time Calculation (min)  38 min    Activity Tolerance  Patient tolerated treatment well    Behavior During Therapy  [facility] for tasks assessed/performed       Past Medical History:  Diagnosis Date  . Allergy   . Amenia   . Anxiety   . Arthritis   . Chronic back pain   . Chronic kidney disease    cyst on bil kidneys  . Colitis, ischemic ([place])   . Colon polyp   . Complication of anesthesia   . COPD (chronic obstructive pulmonary disease) ([place])   . Coronary artery disease   . Depression   . Dysphagia   . Fibromyalgia   . GERD (gastroesophageal reflux disease)   . Hiatal hernia   . Hyperlipidemia   . Hypertension   . IBS (irritable bowel syndrome)   . Myocardial infarction ([place])   . 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 Peripheral neuropathy   . Pneumonia   . PONV (postoperative nausea and vomiting)   . Schatzki's ring   . Sciatica   . Sensorineural hearing loss   . Stroke ([place])    T.I. Colitis  . Tinnitus   . UTI (urinary tract infection)     Past Surgical History:  Procedure Laterality Date  . ABDOMINAL HYSTERECTOMY    . BACK SURGERY    . BLADDER SURGERY    . BREAST SURGERY    . carple tunnal  surgery     Right hand  . CHOLECYSTECTOMY N/A 03/26/2018   Procedure: LAPAROSCOPIC CHOLECYSTECTOMY WITH ATTEMPTED INTRAOPERATIVE CHOLANGIOGRAM;  Surgeon: Donnie Mesa, MD;  Location: [place] [facility];  Service: General;  Laterality: N/A;  . COLONOSCOPY   09/10/2005   in [place]  . DILATION AND CURETTAGE OF UTERUS    . NECK SURGERY     cyst on neck  . pillonidal cyst     resection  . TUBAL LIGATION    . UPPER GI ENDOSCOPY  05/05/2011    There were no vitals filed for this visit.  Subjective Assessment - 08/26/19 1353    Subjective  COVID 19 screening performed on patient upon arrival. Patient reports she has been sore as she overdid the exercises at home.    Pertinent History  fibromyalgia, osteopenia, chronic back pain    Limitations  Lifting;Standing;Walking;House hold activities    Patient Stated Goals  move better    Currently in Pain?  No/denies         [facility] PT Assessment - 08/26/19 0001      Assessment   Medical Diagnosis  Generalized weakness    Referring Provider (PT)  Ledora Bottcher, MD    Hand Dominance  Right    Next MD Visit  June 2021    Prior Therapy  yes, for back      Precautions   Precautions  None  Restrictions   Weight Bearing Restrictions  No                    [facility] Adult PT Treatment/Exercise - 08/26/19 0001      Lumbar Exercises: Supine   Bent Knee Raise  20 reps;2 seconds    Bridge  20 reps;3 seconds      Knee/Hip Exercises: Aerobic   Nustep  L3 x17 min      Knee/Hip Exercises: Seated   Long Arc Quad  Strengthening;Both;2 sets;10 reps;Weights    Long Arc Quad Weight  3 lbs.    Clamshell with TheraBand  Yellow   x20 reps     Shoulder Exercises: Seated   Horizontal ABduction  Strengthening;Both;20 reps;Theraband    Theraband Level (Shoulder Horizontal ABduction)  Level 1 (Yellow)      Shoulder Exercises: Standing   Extension  Strengthening;Both;20 reps;Limitations    Extension Limitations  Green XTS    Row  Strengthening;Both;20 reps;Limitations    Row Limitations  Green XTS      Shoulder Exercises: ROM/Strengthening   UBE (Upper Arm Bike)  120 RPM x6 min (forward/backward)                  PT Long Term Goals - 08/21/19 1645      PT LONG TERM GOAL #1    Title  Patient will be independent with HEP    Time  6    Period  Weeks    Status  Achieved      PT LONG TERM GOAL #2   Title  Patient will improve bilateral UE and LE strength to 4/5 or greater to improve stability during functional activities.    Time  6    Period  Weeks    Status  On-going      PT LONG TERM GOAL #3   Title  Patient will perform modified 5x sit to stand test in 25 seconds or less to improve LE strength and decrease risk for falls.    Time  6    Period  Weeks    Status  On-going      PT LONG TERM GOAL #4   Title  Patient will demonstrate step to step stair negotiation with one railing and no reports of weakness or knees giving way.    Time  6    Period  Weeks    Status  On-going      PT LONG TERM GOAL #5   Title  Patient will demonstrate improved muscular endurance as noted by the ability to perform nustep at level 5 with 60+ steps per minute.    Time  6    Period  Weeks    Status  On-going            Plan - 08/26/19 1438    Clinical Impression Statement  Patient presented in clinic with no reports of any pain or limitations other than soreness from overdoing activites. Patient progressed through overall strengthening such as lumbar/core and posture as well as LEs. No reports of fatigue reported by patient during therex. Patient educated to avoid overdoing any exercises to avoid weakness and fall risk.    Personal Factors and Comorbidities  Comorbidity 2    Comorbidities  fibromyalgia, osteopenia, chronic back pain.    Examination-Activity Limitations  Caring for Others;Carry;Stand;Stairs;Squat;Transfers    Stability/Clinical Decision Making  Stable/Uncomplicated    Rehab Potential  Fair    PT Frequency  2x / week  PT Duration  6 weeks    PT Treatment/Interventions  ADLs/Self Care Home Management;Patient/family education;Gait training;Stair training;Functional mobility training;Therapeutic activities;Therapeutic exercise;Balance training;Passive  range of motion;Neuromuscular re-education;Electrical Stimulation;Cryotherapy;Moist Heat    PT Next Visit Plan  Nustep, UE and LE strengthening, core stabilization and balance activities.    Consulted and Agree with Plan of Care  Patient       Patient will benefit from skilled therapeutic intervention in order to improve the following deficits and impairments:  Difficulty walking, Decreased range of motion, Decreased activity tolerance, Decreased balance, Decreased strength, Impaired UE functional use  Visit Diagnosis: Muscle weakness (generalized)     Problem List Patient Active Problem List   Diagnosis Date Noted  . 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 BACK STRAIN 03/17/2010  . HEMATURIA UNSPECIFIED 02/24/2010  . Dyspareunia 02/24/2010  . OTHER SPEC HYPERTROPHIC&ATROPHIC CONDITION SKIN 02/24/2010  . DYSURIA 11/20/2008  . HYPERLIPIDEMIA 10/30/2008  . ALLERGIC RHINITIS 12/26/2007  . HEADACHE 11/27/2007  . RESTLESS LEG SYNDROME 10/03/2007  . CONSTIPATION 10/03/2007  . [place], FEMALE 10/03/2007  . 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 ANXIETY DEPRESSION 08/01/2007  . TOBACCO ABUSE 08/01/2007  . ISCHEMIC COLITIS 08/01/2007  . [place] DISEASE, LUMBOSACRAL SPINE 08/01/2007    Standley Brooking, PTA 08/26/2019, 2:40 PM  [facility] [address] [place], [place], [address] Phone: [phone]   Fax:  [phone]  Name: NATHALYA WOLANSKI MRN: [id number] Date of Birth: 02-15-1951

## 2019-08-28 ENCOUNTER — Ambulatory Visit: Payer: Medicare HMO | Admitting: Physical Therapy

## 2019-08-28 ENCOUNTER — Encounter: Payer: Self-pay | Admitting: Physical Therapy

## 2019-08-28 ENCOUNTER — Other Ambulatory Visit: Payer: Self-pay

## 2019-08-28 DIAGNOSIS — M6281 Muscle weakness (generalized): Secondary | ICD-10-CM | POA: Diagnosis not present

## 2019-08-28 NOTE — Therapy (Signed)
[facility] [place], [place], [address] Phone: [phone]   Fax:  [phone]  Physical Therapy Treatment  Patient Details  Name: Rebecca [name] MRN: [id number] Date of Birth: Jan 29, 1951 Referring Provider (PT): Ledora Bottcher, MD   Encounter Date: 08/28/2019  PT End of Session - 08/28/19 1447    Visit Number  4    Number of Visits  12    Date for PT Re-Evaluation  10/02/19    PT Start Time  6384    PT Stop Time  1516    PT Time Calculation (min)  42 min    Activity Tolerance  Patient tolerated treatment well    Behavior During Therapy  [facility] for tasks assessed/performed       Past Medical History:  Diagnosis Date  . Allergy   . Amenia   . Anxiety   . Arthritis   . Chronic back pain   . Chronic kidney disease    cyst on bil kidneys  . Colitis, ischemic ([place])   . Colon polyp   . Complication of anesthesia   . COPD (chronic obstructive pulmonary disease) ([place])   . Coronary artery disease   . Depression   . Dysphagia   . Fibromyalgia   . GERD (gastroesophageal reflux disease)   . Hiatal hernia   . Hyperlipidemia   . Hypertension   . IBS (irritable bowel syndrome)   . Myocardial infarction ([place])   . 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 Peripheral neuropathy   . Pneumonia   . PONV (postoperative nausea and vomiting)   . Schatzki's ring   . Sciatica   . Sensorineural hearing loss   . Stroke ([place])    T.I. Colitis  . Tinnitus   . UTI (urinary tract infection)     Past Surgical History:  Procedure Laterality Date  . ABDOMINAL HYSTERECTOMY    . BACK SURGERY    . BLADDER SURGERY    . BREAST SURGERY    . carple tunnal  surgery     Right hand  . CHOLECYSTECTOMY N/A 03/26/2018   Procedure: LAPAROSCOPIC CHOLECYSTECTOMY WITH ATTEMPTED INTRAOPERATIVE CHOLANGIOGRAM;  Surgeon: Donnie Mesa, MD;  Location: [place];  Service: General;  Laterality: N/A;  . COLONOSCOPY  09/10/2005   in [place]   . DILATION AND CURETTAGE OF UTERUS    . NECK SURGERY     cyst on neck  . pillonidal cyst     resection  . TUBAL LIGATION    . UPPER GI ENDOSCOPY  05/05/2011    There were no vitals filed for this visit.  Subjective Assessment - 08/28/19 1447    Subjective  COVID 19 screening performed on patient upon arrival. Patient reports she had no soreness after previous treatment.    Pertinent History  fibromyalgia, osteopenia, chronic back pain    Limitations  Lifting;Standing;Walking;House hold activities    Patient Stated Goals  move better    Currently in Pain?  No/denies         [facility] PT Assessment - 08/28/19 0001      Assessment   Medical Diagnosis  Generalized weakness    Referring Provider (PT)  Ledora Bottcher, MD    Hand Dominance  Right    Next MD Visit  June 2021    Prior Therapy  yes, for back      Precautions   Precautions  None      Restrictions   Weight Bearing Restrictions  No  [facility] Adult PT Treatment/Exercise - 08/28/19 0001      Knee/Hip Exercises: Aerobic   Nustep  L4 x15 min      Knee/Hip Exercises: Seated   Long Arc Quad  Strengthening;Both;2 sets;10 reps;Weights    Long Arc Quad Weight  4 lbs.    Clamshell with TheraBand  Yellow   x20 reps   Sit to Sand  10 reps;without UE support      Shoulder Exercises: Standing   Horizontal ABduction  Strengthening;Both;20 reps;Theraband    Theraband Level (Shoulder Horizontal ABduction)  Level 1 (Yellow)    External Rotation  Strengthening;Both;20 reps;Theraband    Theraband Level (Shoulder External Rotation)  Level 1 (Yellow)    Extension  Strengthening;Both;20 reps;Limitations    Extension Limitations  Green XTS    Row  Strengthening;Both;20 reps;Limitations    Row Limitations  Green XTS    Diagonals  Strengthening;Both;10 reps;Theraband    Theraband Level (Shoulder Diagonals)  Level 1 (Yellow)      Shoulder Exercises: ROM/Strengthening   UBE (Upper Arm Bike)  120 RPM x6  min                  PT Long Term Goals - 08/21/19 1645      PT LONG TERM GOAL #1   Title  Patient will be independent with HEP    Time  6    Period  Weeks    Status  Achieved      PT LONG TERM GOAL #2   Title  Patient will improve bilateral UE and LE strength to 4/5 or greater to improve stability during functional activities.    Time  6    Period  Weeks    Status  On-going      PT LONG TERM GOAL #3   Title  Patient will perform modified 5x sit to stand test in 25 seconds or less to improve LE strength and decrease risk for falls.    Time  6    Period  Weeks    Status  On-going      PT LONG TERM GOAL #4   Title  Patient will demonstrate step to step stair negotiation with one railing and no reports of weakness or knees giving way.    Time  6    Period  Weeks    Status  On-going      PT LONG TERM GOAL #5   Title  Patient will demonstrate improved muscular endurance as noted by the ability to perform nustep at level 5 with 60+ steps per minute.    Time  6    Period  Weeks    Status  On-going            Plan - 08/28/19 1539    Clinical Impression Statement  Patient presented in clinic with less soreness from strength training. Patient monitored for proper technique and monitor of symptoms. Patient reports staying active at home and avoiding excessive reps of HEP to avoid soreness. Patient did demonstrate increased weakness of shoulder/postural musculature esepecially with D2 strengthening.    Personal Factors and Comorbidities  Comorbidity 2    Comorbidities  fibromyalgia, osteopenia, chronic back pain.    Examination-Activity Limitations  Caring for Others;Carry;Stand;Stairs;Squat;Transfers    Stability/Clinical Decision Making  Stable/Uncomplicated    Rehab Potential  Fair    PT Frequency  2x / week    PT Duration  6 weeks    PT Treatment/Interventions  ADLs/Self Care Home Management;Patient/family education;Gait training;Stair  training;Functional  mobility training;Therapeutic activities;Therapeutic exercise;Balance training;Passive range of motion;Neuromuscular re-education;Electrical Stimulation;Cryotherapy;Moist Heat    PT Next Visit Plan  Nustep, UE and LE strengthening, core stabilization and balance activities.    Consulted and Agree with Plan of Care  Patient       Patient will benefit from skilled therapeutic intervention in order to improve the following deficits and impairments:  Difficulty walking, Decreased range of motion, Decreased activity tolerance, Decreased balance, Decreased strength, Impaired UE functional use  Visit Diagnosis: Muscle weakness (generalized)     Problem List Patient Active Problem List   Diagnosis Date Noted  . 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 BACK STRAIN 03/17/2010  . HEMATURIA UNSPECIFIED 02/24/2010  . Dyspareunia 02/24/2010  . OTHER SPEC HYPERTROPHIC&ATROPHIC CONDITION SKIN 02/24/2010  . DYSURIA 11/20/2008  . HYPERLIPIDEMIA 10/30/2008  . ALLERGIC RHINITIS 12/26/2007  . HEADACHE 11/27/2007  . RESTLESS LEG SYNDROME 10/03/2007  . CONSTIPATION 10/03/2007  . [place], FEMALE 10/03/2007  . 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 ANXIETY DEPRESSION 08/01/2007  . TOBACCO ABUSE 08/01/2007  . ISCHEMIC COLITIS 08/01/2007  . [place] DISEASE, LUMBOSACRAL SPINE 08/01/2007    Standley Brooking, PTA 08/28/2019, 3:59 PM  [facility] [address] [place], [place], [address] Phone: [phone]   Fax:  [phone]  Name: BRAYLON LEMMONS MRN: [id number] Date of Birth: 1950-04-28

## 2019-09-02 ENCOUNTER — Other Ambulatory Visit: Payer: Self-pay

## 2019-09-02 ENCOUNTER — Ambulatory Visit: Payer: Medicare HMO | Admitting: Physical Therapy

## 2019-09-02 ENCOUNTER — Encounter: Payer: Self-pay | Admitting: Physical Therapy

## 2019-09-02 DIAGNOSIS — M6281 Muscle weakness (generalized): Secondary | ICD-10-CM

## 2019-09-02 NOTE — Therapy (Addendum)
[facility] [place], [place], [address] Phone: [phone]   Fax:  [phone]  Physical Therapy Treatment PHYSICAL THERAPY DISCHARGE SUMMARY  Visits from Start of Care: 5  Current functional level related to goals / functional outcomes: See below   Remaining deficits: See goals   Education / Equipment: HEP Plan: Patient agrees to discharge.  Patient goals were not met. Patient is being discharged due to not returning since the last visit.  ?????  Gabriela Eves, PT, DPT 02/04/20   Patient Details  Name: Rebecca [name] MRN: [id number] Date of Birth: 1950-05-26 Referring Provider (PT): Ledora Bottcher, MD   Encounter Date: 09/02/2019  PT End of Session - 09/02/19 1437    Visit Number  5    Number of Visits  12    Date for PT Re-Evaluation  10/02/19    PT Start Time  1430    PT Stop Time  1515    PT Time Calculation (min)  45 min    Activity Tolerance  Patient tolerated treatment well    Behavior During Therapy  [facility] for tasks assessed/performed       Past Medical History:  Diagnosis Date  . Allergy   . Amenia   . Anxiety   . Arthritis   . Chronic back pain   . Chronic kidney disease    cyst on bil kidneys  . Colitis, ischemic ([place])   . Colon polyp   . Complication of anesthesia   . COPD (chronic obstructive pulmonary disease) ([place])   . Coronary artery disease   . Depression   . Dysphagia   . Fibromyalgia   . GERD (gastroesophageal reflux disease)   . Hiatal hernia   . Hyperlipidemia   . Hypertension   . IBS (irritable bowel syndrome)   . Myocardial infarction ([place])   . 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 Peripheral neuropathy   . Pneumonia   . PONV (postoperative nausea and vomiting)   . Schatzki's ring   . Sciatica   . Sensorineural hearing loss   . Stroke ([place])    T.I. Colitis  . Tinnitus   . UTI (urinary tract infection)     Past Surgical History:  Procedure  Laterality Date  . ABDOMINAL HYSTERECTOMY    . BACK SURGERY    . BLADDER SURGERY    . BREAST SURGERY    . carple tunnal  surgery     Right hand  . CHOLECYSTECTOMY N/A 03/26/2018   Procedure: LAPAROSCOPIC CHOLECYSTECTOMY WITH ATTEMPTED INTRAOPERATIVE CHOLANGIOGRAM;  Surgeon: Donnie Mesa, MD;  Location: [place];  Service: General;  Laterality: N/A;  . COLONOSCOPY  09/10/2005   in [place]  . DILATION AND CURETTAGE OF UTERUS    . NECK SURGERY     cyst on neck  . pillonidal cyst     resection  . TUBAL LIGATION    . UPPER GI ENDOSCOPY  05/05/2011    There were no vitals filed for this visit.  Subjective Assessment - 09/02/19 1437    Subjective  COVID 19 screening performed on patient upon arrival. Patient arrives feeling alright. Reports she push mowed her yard took about 4 hours but took a lot of rest breaks.    Pertinent History  fibromyalgia, osteopenia, chronic back pain    Limitations  Lifting;Standing;Walking;House hold activities    Patient Stated Goals  move better    Currently in Pain?  No/denies  [facility] PT Assessment - 09/02/19 0001      Assessment   Medical Diagnosis  Generalized weakness    Referring Provider (PT)  Ledora Bottcher, MD    Hand Dominance  Right    Next MD Visit  June 2021    Prior Therapy  yes, for back      Precautions   Precautions  None                    [facility] Adult PT Treatment/Exercise - 09/02/19 0001      Lumbar Exercises: Standing   Row  Strengthening;Both;20 reps    Row Limitations  blue XTS    Shoulder Extension  Strengthening;20 reps    Shoulder Extension Limitations  blue XTS      Knee/Hip Exercises: Aerobic   Nustep  L4 x15 min      Knee/Hip Exercises: Standing   Heel Raises  Both;2 sets;10 reps    Heel Raises Limitations  B toe raise 2x10 reps    Functional Squat  2 sets;10 reps      Knee/Hip Exercises: Seated   Long Arc Quad  Strengthening;Both;2 sets;10 reps;Weights    Long Arc Quad Weight  4 lbs.     Clamshell with TheraBand  Yellow   x20     Shoulder Exercises: ROM/Strengthening   UBE (Upper Arm Bike)  120 RPM x8 min                  PT Long Term Goals - 08/21/19 1645      PT LONG TERM GOAL #1   Title  Patient will be independent with HEP    Time  6    Period  Weeks    Status  Achieved      PT LONG TERM GOAL #2   Title  Patient will improve bilateral UE and LE strength to 4/5 or greater to improve stability during functional activities.    Time  6    Period  Weeks    Status  On-going      PT LONG TERM GOAL #3   Title  Patient will perform modified 5x sit to stand test in 25 seconds or less to improve LE strength and decrease risk for falls.    Time  6    Period  Weeks    Status  On-going      PT LONG TERM GOAL #4   Title  Patient will demonstrate step to step stair negotiation with one railing and no reports of weakness or knees giving way.    Time  6    Period  Weeks    Status  On-going      PT LONG TERM GOAL #5   Title  Patient will demonstrate improved muscular endurance as noted by the ability to perform nustep at level 5 with 60+ steps per minute.    Time  6    Period  Weeks    Status  On-going            Plan - 09/02/19 1522    Clinical Impression Statement  Patient responded well to therapy and denied any increase of pain or soreness with exercise. Patient requires verbal, tactile cuing and demonstration for proper technique. Patient and PT discussed finding balance of home activities and importance of rest to prevent over exertion and soreness. Patient reported understanding.    Personal Factors and Comorbidities  Comorbidity 2    Comorbidities  fibromyalgia, osteopenia, chronic back  pain.    Examination-Activity Limitations  Caring for Others;Carry;Stand;Stairs;Squat;Transfers    Stability/Clinical Decision Making  Stable/Uncomplicated    Clinical Decision Making  Low    Rehab Potential  Fair    PT Frequency  2x / week    PT Duration  6  weeks    PT Treatment/Interventions  ADLs/Self Care Home Management;Patient/family education;Gait training;Stair training;Functional mobility training;Therapeutic activities;Therapeutic exercise;Balance training;Passive range of motion;Neuromuscular re-education;Electrical Stimulation;Cryotherapy;Moist Heat    PT Next Visit Plan  Nustep, UE and LE strengthening, core stabilization and balance activities.    Consulted and Agree with Plan of Care  Patient       Patient will benefit from skilled therapeutic intervention in order to improve the following deficits and impairments:  Difficulty walking, Decreased range of motion, Decreased activity tolerance, Decreased balance, Decreased strength, Impaired UE functional use  Visit Diagnosis: Muscle weakness (generalized)     Problem List Patient Active Problem List   Diagnosis Date Noted  . 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 BACK STRAIN 03/17/2010  . HEMATURIA UNSPECIFIED 02/24/2010  . Dyspareunia 02/24/2010  . OTHER SPEC HYPERTROPHIC&ATROPHIC CONDITION SKIN 02/24/2010  . DYSURIA 11/20/2008  . HYPERLIPIDEMIA 10/30/2008  . ALLERGIC RHINITIS 12/26/2007  . HEADACHE 11/27/2007  . RESTLESS LEG SYNDROME 10/03/2007  . CONSTIPATION 10/03/2007  . [place], FEMALE 10/03/2007  . 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 ANXIETY DEPRESSION 08/01/2007  . TOBACCO ABUSE 08/01/2007  . ISCHEMIC COLITIS 08/01/2007  . [place] DISEASE, LUMBOSACRAL SPINE 08/01/2007    Gabriela Eves, PT, DPT 09/02/2019, 3:25 PM  [facility] [place], [place], [address] Phone: [phone]   Fax:  [phone]  Name: Rebecca [name] MRN: [id number] Date of Birth: [birth year]

## 2019-11-24 ENCOUNTER — Encounter (HOSPITAL_COMMUNITY): Payer: Self-pay | Admitting: Psychiatry

## 2019-11-24 ENCOUNTER — Other Ambulatory Visit: Payer: Self-pay

## 2019-11-24 ENCOUNTER — Ambulatory Visit (INDEPENDENT_AMBULATORY_CARE_PROVIDER_SITE_OTHER): Payer: Medicare HMO | Admitting: Psychiatry

## 2019-11-24 DIAGNOSIS — F331 Major depressive disorder, recurrent, moderate: Secondary | ICD-10-CM

## 2019-11-24 MED ORDER — SERTRALINE HCL 50 MG PO TABS: 50.0000 mg | ORAL_TABLET | Freq: Two times a day (BID) | ORAL | 2 refills | Status: DC

## 2019-11-24 MED ORDER — ALPRAZOLAM 0.5 MG PO TABS: 0.5000 mg | ORAL_TABLET | Freq: Four times a day (QID) | ORAL | 2 refills | Status: DC

## 2019-11-24 NOTE — Progress Notes (Signed)
Virtual Visit via Telephone Note  I connected with Rebecca [name] on 11/24/19 at  3:00 PM EDT by telephone and verified that I am speaking with the correct person using two identifiers.   I discussed the limitations, risks, security and privacy concerns of performing an evaluation and management service by telephone and the availability of in person appointments. I also discussed with the patient that there may be a patient responsible charge related to this service. The patient expressed understanding and agreed to proceed.    I discussed the assessment and treatment plan with the patient. The patient was provided an opportunity to ask questions and all were answered. The patient agreed with the plan and demonstrated an understanding of the instructions.   The patient was advised to call back or seek an in-person evaluation if the symptoms worsen or if the condition fails to improve as anticipated.  I provided 15 minutes of non-face-to-face time during this encounter. Location: Provider office, patient home  [name], MD  [facility] MD/PA/NP OP Progress Note  11/24/2019 3:30 PM Rebecca [name]  MRN:  [id number]  Chief Complaint:  Chief Complaint    Depression; Anxiety; Follow-up     HPI: This patient is a 69 year old white female lives with her husband and mother-in-law in [place].  She is a retired [employer] manager.  The patient returns for follow-up regarding her depression and anxiety.  Recently she has had more health issues.  She is dealing with chronic diarrhea.  This seems to have started after her gallbladder surgery.  Now she may need to have a ERCP.  She cut out caffeine and this seems to have helped.  Her mood is generally been good despite this and she denies significant anxiety or panic attacks.  She asked about Zoloft affecting the colitis but I do not think this is the main reason since it started with the gallbladder surgery. Visit Diagnosis:    ICD-10-CM   1. Major depressive  disorder, recurrent episode, moderate (HCC)  F33.1     Past Psychiatric History: 2 prior psychiatric hospitalizations for depression  Past Medical History:  Past Medical History:  Diagnosis Date  . Allergy   . Amenia   . Anxiety   . Arthritis   . Chronic back pain   . Chronic kidney disease    cyst on bil kidneys  . Colitis, ischemic ([place])   . Colon polyp   . Complication of anesthesia   . COPD (chronic obstructive pulmonary disease) ([place])   . Coronary artery disease   . Depression   . Dysphagia   . Fibromyalgia   . GERD (gastroesophageal reflux disease)   . Hiatal hernia   . Hyperlipidemia   . Hypertension   . IBS (irritable bowel syndrome)   . Myocardial infarction ([place])   . 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 Peripheral neuropathy   . Pneumonia   . PONV (postoperative nausea and vomiting)   . Schatzki's ring   . Sciatica   . Sensorineural hearing loss   . Stroke ([place])    T.I. Colitis  . Tinnitus   . UTI (urinary tract infection)     Past Surgical History:  Procedure Laterality Date  . ABDOMINAL HYSTERECTOMY    . BACK SURGERY    . BLADDER SURGERY    . BREAST SURGERY    . carple tunnal  surgery     Right hand  . CHOLECYSTECTOMY N/A 03/26/2018   Procedure: LAPAROSCOPIC CHOLECYSTECTOMY WITH ATTEMPTED  INTRAOPERATIVE CHOLANGIOGRAM;  Surgeon: Donnie Mesa, MD;  Location: [place];  Service: General;  Laterality: N/A;  . COLONOSCOPY  09/10/2005   in [place]  . DILATION AND CURETTAGE OF UTERUS    . NECK SURGERY     cyst on neck  . pillonidal cyst     resection  . TUBAL LIGATION    . UPPER GI ENDOSCOPY  05/05/2011    Family Psychiatric History: see below  Family History:  Family History  Problem Relation Age of Onset  . Alcohol abuse Father   . Depression Father   . Cancer Father        lung  . Cancer Paternal Grandfather        lung  . Osteoporosis Maternal Grandmother   . Kyphosis Maternal Grandmother   . Stroke Maternal Grandfather    . Osteoporosis Mother   . Kyphosis Mother   . Stroke Mother   . Heart disease Mother   . Kidney disease Mother   . Sleep apnea Mother        said used oxygen at night   . Arthritis Brother   . Arthritis Brother   . Scoliosis Daughter   . Scoliosis Son   . Colon cancer Maternal Uncle   . Diabetes Maternal Uncle   . Irritable bowel syndrome Maternal Uncle   . Esophageal cancer Neg Hx   . Stomach cancer Neg Hx     Social History:  Social History   Socioeconomic History  . Marital status: Married    Spouse name: Not on file  . Number of children: 2  . Years of education: Not on file  . Highest education level: Not on file  Occupational History  . Occupation: CNA  Tobacco Use  . Smoking status: Current Every Day Smoker    Packs/day: 1.00    Years: 44.00    Pack years: 44.00    Types: Cigarettes  . Smokeless tobacco: Never Used  . Tobacco comment: smokes 5 cig daily  Vaping Use  . Vaping Use: Never used  Substance and Sexual Activity  . Alcohol use: No    Alcohol/week: 0.0 standard drinks  . Drug use: No  . Sexual activity: Yes    Birth control/protection: Surgical    Comment: hyst  Other Topics Concern  . Not on file  Social History Narrative  . Not on file   Social Determinants of Health   Financial Resource Strain:   . Difficulty of Paying Living Expenses:   Food Insecurity:   . Worried About Charity fundraiser in the Last Year:   . Arboriculturist in the Last Year:   Transportation Needs:   . Film/video editor (Medical):   Marland Kitchen Lack of Transportation (Non-Medical):   Physical Activity:   . Days of Exercise per Week:   . Minutes of Exercise per Session:   Stress:   . Feeling of Stress :   Social Connections:   . Frequency of Communication with Friends and Family:   . Frequency of Social Gatherings with Friends and Family:   . Attends Religious Services:   . Active Member of Clubs or Organizations:   . Attends Archivist Meetings:   Marland Kitchen  Marital Status:     Allergies:  Allergies  Allergen Reactions  . Bupropion Hcl Swelling  . Doxycycline Other (See Comments)    When  she is on Valium   . Simvastatin Other (See Comments)    Sleepy and weak  .  Toradol [Ketorolac Tromethamine] Other (See Comments)    Causes gi bleeding    . Cephalexin Other (See Comments)    Burning with urination  . Darvocet [Propoxyphene N-Acetaminophen] Anxiety  . Penicillins Swelling and Rash    Has patient had a PCN reaction causing immediate rash, facial/tongue/throat swelling, SOB or lightheadedness with hypotension: No Has patient had a PCN reaction causing severe rash involving mucus membranes or skin necrosis: No Has patient had a PCN reaction that required hospitalization: No Has patient had a PCN reaction occurring within the last 10 years: No If all of the above answers are "NO", then may proceed with Cephalosporin use.     Metabolic Disorder Labs: No results found for: HGBA1C, MPG No results found for: PROLACTIN Lab Results  Component Value Date   CHOL 235 (H) 08/28/2008   TRIG 147 08/28/2008   HDL 52 08/28/2008   CHOLHDL 4.5 Ratio 08/28/2008   VLDL 29 08/28/2008   LDLCALC 154 (H) 08/28/2008   Lab Results  Component Value Date   TSH 1.511 08/28/2008   TSH 0.802 08/16/2007    Therapeutic Level Labs: No results found for: LITHIUM No results found for: VALPROATE No components found for:  CBMZ  Current Medications: Current Outpatient Medications  Medication Sig Dispense Refill  . acetaminophen (TYLENOL) 500 MG tablet Take 500 mg by mouth every 6 (six) hours as needed for moderate pain.    Marland Kitchen albuterol (PROVENTIL) (2.5 MG/3ML) 0.083% nebulizer solution Take 2.5 mg by nebulization every 6 (six) hours as needed for wheezing or shortness of breath.    . ALPRAZolam (XANAX) 0.5 MG tablet Take 1 tablet (0.5 mg total) by mouth 4 (four) times daily. 120 tablet 2  . betamethasone dipropionate (DIPROLENE) 0.05 % cream Apply 1  application topically 2 (two) times daily as needed (rash).     . cholestyramine (QUESTRAN) 4 GM/DOSE powder TAKE 4 GRAMS BY MOUTH MIXED IN WATER OF JUICE 3 TIMES DAILY WITH MEALS    . conjugated estrogens (PREMARIN) vaginal cream Place 1 Applicatorful vaginally daily.     . Cyanocobalamin (VITAMIN B-12 IJ) Inject 1,000 mcg as directed every 30 (thirty) days.     . diclofenac Sodium (VOLTAREN) 1 % GEL PLACE ONTO THE SKIN 4 (FOUR) TIMES A DAY AS NEEDED. APPLY 4G TO SITE AS DIRECTED    . fluticasone (FLONASE) 50 MCG/ACT nasal spray Place 1 spray into both nostrils daily.     . hydrOXYzine (ATARAX/VISTARIL) 25 MG tablet TAKE ONE TABLET (25 MG DOSE) BY MOUTH EVERY 6 (SIX) HOURS AS NEEDED FOR ITCHING.    . metoprolol tartrate (LOPRESSOR) 25 MG tablet Take by mouth.    . montelukast (SINGULAIR) 10 MG tablet Take 10 mg by mouth daily as needed (allergies).     . nicotine (NICODERM CQ - DOSED IN MG/24 HR) 7 mg/24hr patch Place 7 mg onto the skin daily.    . promethazine (PHENERGAN) 25 MG tablet Take 25 mg by mouth every 6 (six) hours as needed for nausea or vomiting.    . ranitidine (ZANTAC) 150 MG tablet Take 150 mg by mouth at bedtime.  5  . sertraline (ZOLOFT) 50 MG tablet Take 1 tablet (50 mg total) by mouth 2 (two) times daily. 180 tablet 2  . sodium chloride (OCEAN) 0.65 % SOLN nasal spray Place 1 spray into both nostrils as needed for congestion.    . triamcinolone ointment (KENALOG) 0.5 % Apply 1 application topically 2 (two) times daily as needed (irritation).    Marland Kitchen  umeclidinium-vilanterol (ANORO ELLIPTA) 62.5-25 MCG/INH AEPB Inhale 1 puff into the lungs daily as needed (shortness of breath).     No current facility-administered medications for this visit.     Musculoskeletal: Strength & Muscle Tone: within normal limits Gait & Station: normal Patient leans: N/A  Psychiatric Specialty Exam: Review of Systems  Gastrointestinal: Positive for diarrhea.    There were no vitals taken for  this visit.There is no height or weight on file to calculate BMI.  General Appearance: NA  Eye Contact:  NA  Speech:  Clear and Coherent  Volume:  Normal  Mood:  Euthymic  Affect:  NA  Thought Process:  Goal Directed  Orientation:  Full (Time, Place, and Person)  Thought Content: WDL   Suicidal Thoughts:  No  Homicidal Thoughts:  No  Memory:  Immediate;   Good Recent;   Good Remote;   Good  Judgement:  Good  Insight:  Fair  Psychomotor Activity:  Normal  Concentration:  Concentration: Good and Attention Span: Good  Recall:  Good  Fund of Knowledge: Good  Language: Good  Akathisia:  No  Handed:  Right  AIMS (if indicated): not done  Assets:  Communication Skills Desire for Improvement Resilience Social Support Talents/Skills  ADL's:  Intact  Cognition: WNL  Sleep:  Good   Screenings: PHQ2-9     [facility] Visit from [facility] in [facility]  PHQ-2 Total Score 0       Assessment and Plan: This patient is a 69 year old female with a history of depression anxiety.  She continues to do well on her current regimen.  She will continue Zoloft 50 mg twice daily for depression and panic attacks and Xanax 0.5 mg 4 times daily for anxiety.  She will return to see me in 3 months   [name], MD 11/24/2019, 3:30 PM

## 2020-01-01 ENCOUNTER — Other Ambulatory Visit (HOSPITAL_COMMUNITY): Payer: Self-pay | Admitting: Psychiatry

## 2020-02-16 ENCOUNTER — Encounter (HOSPITAL_COMMUNITY): Payer: Self-pay | Admitting: Psychiatry

## 2020-02-16 ENCOUNTER — Ambulatory Visit (INDEPENDENT_AMBULATORY_CARE_PROVIDER_SITE_OTHER): Payer: Medicare HMO | Admitting: Psychiatry

## 2020-02-16 ENCOUNTER — Other Ambulatory Visit: Payer: Self-pay

## 2020-02-16 DIAGNOSIS — F331 Major depressive disorder, recurrent, moderate: Secondary | ICD-10-CM | POA: Diagnosis not present

## 2020-02-16 MED ORDER — ALPRAZOLAM 0.5 MG PO TABS: 0.5000 mg | ORAL_TABLET | Freq: Four times a day (QID) | ORAL | 2 refills | Status: DC

## 2020-02-16 MED ORDER — SERTRALINE HCL 50 MG PO TABS: 50.0000 mg | ORAL_TABLET | Freq: Two times a day (BID) | ORAL | 2 refills | Status: DC

## 2020-02-16 NOTE — Progress Notes (Signed)
Virtual Visit via Telephone Note  I connected with Rebecca [name] on 02/16/20 at 10:40 AM EST by telephone and verified that I am speaking with the correct person using two identifiers.  Location: Patient: home Provider: office   I discussed the limitations, risks, security and privacy concerns of performing an evaluation and management service by telephone and the availability of in person appointments. I also discussed with the patient that there may be a patient responsible charge related to this service. The patient expressed understanding and agreed to proceed.:    I discussed the assessment and treatment plan with the patient. The patient was provided an opportunity to ask questions and all were answered. The patient agreed with the plan and demonstrated an understanding of the instructions.   The patient was advised to call back or seek an in-person evaluation if the symptoms worsen or if the condition fails to improve as anticipated.  I provided 15 minutes of non-face-to-face time during this encounter.   [name], MD  [facility] MD/PA/NP OP Progress Note  02/16/2020 10:54 AM Rebecca [name]  MRN:  [id number]  Chief Complaint:  Chief Complaint    Depression; Anxiety; Follow-up     HPI: This patient is a 69 year old white female who lives with her husband and mother-in-law in [place].  She is a retired [employer]ar[employer] manager.  The patient returns for follow-up regarding her anxiety and depression.  Last time she was having a lot of struggles with diarrhea but it is better now since she has cut out caffeine and she had a colonoscopy and had several polyps removed.  She is still dealing with chronic back pain.  She has had chronic hematuria and has to see a urologist.  Other than that her mood has been good and she denies significant anxiety depression or thoughts of self-harm.  She is sleeping well.  She denies any panic attacks or agoraphobia Visit Diagnosis:    ICD-10-CM   1. Major depressive  disorder, recurrent episode, moderate (HCC)  F33.1     Past Psychiatric History: 2 prior psychiatric hospitalizations for depression  Past Medical History:  Past Medical History:  Diagnosis Date  . Allergy   . Amenia   . Anxiety   . Arthritis   . Chronic back pain   . Chronic kidney disease    cyst on bil kidneys  . Colitis, ischemic ([place])   . Colon polyp   . Complication of anesthesia   . COPD (chronic obstructive pulmonary disease) ([place])   . Coronary artery disease   . Depression   . Dysphagia   . Fibromyalgia   . GERD (gastroesophageal reflux disease)   . Hiatal hernia   . Hyperlipidemia   . Hypertension   . IBS (irritable bowel syndrome)   . Myocardial infarction ([place])   . 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 Peripheral neuropathy   . Pneumonia   . PONV (postoperative nausea and vomiting)   . Schatzki's ring   . Sciatica   . Sensorineural hearing loss   . Stroke ([place])    T.I. Colitis  . Tinnitus   . UTI (urinary tract infection)     Past Surgical History:  Procedure Laterality Date  . ABDOMINAL HYSTERECTOMY    . BACK SURGERY    . BLADDER SURGERY    . BREAST SURGERY    . carple tunnal  surgery     Right hand  . CHOLECYSTECTOMY N/A 03/26/2018   Procedure: LAPAROSCOPIC CHOLECYSTECTOMY WITH  ATTEMPTED INTRAOPERATIVE CHOLANGIOGRAM;  Surgeon: Donnie Mesa, MD;  Location: [facility];  Service: General;  Laterality: N/A;  . COLONOSCOPY  09/10/2005   in [place]  . DILATION AND CURETTAGE OF UTERUS    . NECK SURGERY     cyst on neck  . pillonidal cyst     resection  . TUBAL LIGATION    . UPPER GI ENDOSCOPY  05/05/2011    Family Psychiatric History: see below   Family History:  Family History  Problem Relation Age of Onset  . Alcohol abuse Father   . Depression Father   . Cancer Father        lung  . Cancer Paternal Grandfather        lung  . Osteoporosis Maternal Grandmother   . Kyphosis Maternal Grandmother   . Stroke Maternal Grandfather    . Osteoporosis Mother   . Kyphosis Mother   . Stroke Mother   . Heart disease Mother   . Kidney disease Mother   . Sleep apnea Mother        said used oxygen at night   . Arthritis Brother   . Arthritis Brother   . Scoliosis Daughter   . Scoliosis Son   . Colon cancer Maternal Uncle   . Diabetes Maternal Uncle   . Irritable bowel syndrome Maternal Uncle   . Esophageal cancer Neg Hx   . Stomach cancer Neg Hx     Social History:  Social History   Socioeconomic History  . Marital status: Married    Spouse name: Not on file  . Number of children: 2  . Years of education: Not on file  . Highest education level: Not on file  Occupational History  . Occupation: CNA  Tobacco Use  . Smoking status: Current Every Day Smoker    Packs/day: 1.00    Years: 44.00    Pack years: 44.00    Types: Cigarettes  . Smokeless tobacco: Never Used  . Tobacco comment: smokes 5 cig daily  Vaping Use  . Vaping Use: Never used  Substance and Sexual Activity  . Alcohol use: No    Alcohol/week: 0.0 standard drinks  . Drug use: No  . Sexual activity: Yes    Birth control/protection: Surgical    Comment: hyst  Other Topics Concern  . Not on file  Social History Narrative  . Not on file   Social Determinants of Health   Financial Resource Strain:   . Difficulty of Paying Living Expenses: Not on file  Food Insecurity:   . Worried About Charity fundraiser in the Last Year: Not on file  . Ran Out of Food in the Last Year: Not on file  Transportation Needs:   . Lack of Transportation (Medical): Not on file  . Lack of Transportation (Non-Medical): Not on file  Physical Activity:   . Days of Exercise per Week: Not on file  . Minutes of Exercise per Session: Not on file  Stress:   . Feeling of Stress : Not on file  Social Connections:   . Frequency of Communication with Friends and Family: Not on file  . Frequency of Social Gatherings with Friends and Family: Not on file  . Attends  Religious Services: Not on file  . Active Member of Clubs or Organizations: Not on file  . Attends Archivist Meetings: Not on file  . Marital Status: Not on file    Allergies:  Allergies  Allergen Reactions  . Bupropion  Hcl Swelling  . Doxycycline Other (See Comments)    When  she is on Valium   . Simvastatin Other (See Comments)    Sleepy and weak  . Toradol [Ketorolac Tromethamine] Other (See Comments)    Causes gi bleeding    . Cephalexin Other (See Comments)    Burning with urination  . Darvocet [Propoxyphene N-Acetaminophen] Anxiety  . Penicillins Swelling and Rash    Has patient had a PCN reaction causing immediate rash, facial/tongue/throat swelling, SOB or lightheadedness with hypotension: No Has patient had a PCN reaction causing severe rash involving mucus membranes or skin necrosis: No Has patient had a PCN reaction that required hospitalization: No Has patient had a PCN reaction occurring within the last 10 years: No If all of the above answers are "NO", then may proceed with Cephalosporin use.     Metabolic Disorder Labs: No results found for: HGBA1C, MPG No results found for: PROLACTIN Lab Results  Component Value Date   CHOL 235 (H) 08/28/2008   TRIG 147 08/28/2008   HDL 52 08/28/2008   CHOLHDL 4.5 Ratio 08/28/2008   VLDL 29 08/28/2008   LDLCALC 154 (H) 08/28/2008   Lab Results  Component Value Date   TSH 1.511 08/28/2008   TSH 0.802 08/16/2007    Therapeutic Level Labs: No results found for: LITHIUM No results found for: VALPROATE No components found for:  CBMZ  Current Medications: Current Outpatient Medications  Medication Sig Dispense Refill  . acetaminophen (TYLENOL) 500 MG tablet Take 500 mg by mouth every 6 (six) hours as needed for moderate pain.    Marland Kitchen albuterol (PROVENTIL) (2.5 MG/3ML) 0.083% nebulizer solution Take 2.5 mg by nebulization every 6 (six) hours as needed for wheezing or shortness of breath.    . ALPRAZolam  (XANAX) 0.5 MG tablet Take 1 tablet (0.5 mg total) by mouth 4 (four) times daily. 120 tablet 2  . betamethasone dipropionate (DIPROLENE) 0.05 % cream Apply 1 application topically 2 (two) times daily as needed (rash).     . cholestyramine (QUESTRAN) 4 GM/DOSE powder TAKE 4 GRAMS BY MOUTH MIXED IN WATER OF JUICE 3 TIMES DAILY WITH MEALS    . conjugated estrogens (PREMARIN) vaginal cream Place 1 Applicatorful vaginally daily.     . Cyanocobalamin (VITAMIN B-12 IJ) Inject 1,000 mcg as directed every 30 (thirty) days.     . diclofenac Sodium (VOLTAREN) 1 % GEL PLACE ONTO THE SKIN 4 (FOUR) TIMES A DAY AS NEEDED. APPLY 4G TO SITE AS DIRECTED    . fluticasone (FLONASE) 50 MCG/ACT nasal spray Place 1 spray into both nostrils daily.     . hydrOXYzine (ATARAX/VISTARIL) 25 MG tablet TAKE ONE TABLET (25 MG DOSE) BY MOUTH EVERY 6 (SIX) HOURS AS NEEDED FOR ITCHING.    . metoprolol tartrate (LOPRESSOR) 25 MG tablet Take by mouth.    . montelukast (SINGULAIR) 10 MG tablet Take 10 mg by mouth daily as needed (allergies).     . nicotine (NICODERM CQ - DOSED IN MG/24 HR) 7 mg/24hr patch Place 7 mg onto the skin daily.    . promethazine (PHENERGAN) 25 MG tablet Take 25 mg by mouth every 6 (six) hours as needed for nausea or vomiting.    . ranitidine (ZANTAC) 150 MG tablet Take 150 mg by mouth at bedtime.  5  . sertraline (ZOLOFT) 50 MG tablet Take 1 tablet (50 mg total) by mouth 2 (two) times daily. 180 tablet 2  . sodium chloride (OCEAN) 0.65 % SOLN nasal spray  Place 1 spray into both nostrils as needed for congestion.    . triamcinolone ointment (KENALOG) 0.5 % Apply 1 application topically 2 (two) times daily as needed (irritation).    Marland Kitchen umeclidinium-vilanterol (ANORO ELLIPTA) 62.5-25 MCG/INH AEPB Inhale 1 puff into the lungs daily as needed (shortness of breath).     No current facility-administered medications for this visit.     Musculoskeletal: Strength & Muscle Tone: within normal limits Gait & Station:  normal Patient leans: N/A  Psychiatric Specialty Exam: Review of Systems  Musculoskeletal: Positive for back pain.  All other systems reviewed and are negative.   There were no vitals taken for this visit.There is no height or weight on file to calculate BMI.  General Appearance: NA  Eye Contact:  NA  Speech:  Clear and Coherent  Volume:  Normal  Mood:  Euthymic  Affect:  NA  Thought Process:  Goal Directed  Orientation:  Full (Time, Place, and Person)  Thought Content: WDL, Paranoid Ideation and Rumination   Suicidal Thoughts:  No  Homicidal Thoughts:  No  Memory:  Immediate;   Good Recent;   Good Remote;   Fair  Judgement:  Good  Insight:  Good  Psychomotor Activity:  Normal  Concentration:  Concentration: Good and Attention Span: Good  Recall:  Good  Fund of Knowledge: Good  Language: Good  Akathisia:  No  Handed:  Right  AIMS (if indicated): not done  Assets:  Communication Skills Desire for Improvement Resilience Social Support Talents/Skills  ADL's:  Intact  Cognition: WNL  Sleep:  Good   Screenings: PHQ2-9     Office Visit from 04/25/2017 in [facility]  PHQ-2 Total Score 0       Assessment and Plan: This patient is a 69 year old female with a history of depression and anxiety.  She continues to do well on her current regimen.  She will continue Zoloft 50 mg twice daily for depression and panic attacks and Xanax 0.5 mg 4 times daily for anxiety.  She will return to see me in 3 months   [name], MD 02/16/2020, 10:54 AM

## 2020-05-18 ENCOUNTER — Encounter (HOSPITAL_COMMUNITY): Payer: Self-pay | Admitting: Psychiatry

## 2020-05-18 ENCOUNTER — Other Ambulatory Visit: Payer: Self-pay

## 2020-05-18 ENCOUNTER — Telehealth (INDEPENDENT_AMBULATORY_CARE_PROVIDER_SITE_OTHER): Payer: Medicare HMO | Admitting: Psychiatry

## 2020-05-18 DIAGNOSIS — F331 Major depressive disorder, recurrent, moderate: Secondary | ICD-10-CM

## 2020-05-18 MED ORDER — SERTRALINE HCL 50 MG PO TABS: 50.0000 mg | ORAL_TABLET | Freq: Two times a day (BID) | ORAL | 2 refills | Status: DC

## 2020-05-18 MED ORDER — ALPRAZOLAM 0.5 MG PO TABS: 0.5000 mg | ORAL_TABLET | Freq: Four times a day (QID) | ORAL | 2 refills | Status: DC

## 2020-05-18 NOTE — Progress Notes (Signed)
Virtual Visit via Video Note  I connected with Rebecca [name] on 05/18/20 at  1:00 PM EST by a video enabled telemedicine application and verified that I am speaking with the correct person using two identifiers.  Location: Patient: home Provider: home   I discussed the limitations of evaluation and management by telemedicine and the availability of in person appointments. The patient expressed understanding and agreed to proceed   I discussed the assessment and treatment plan with the patient. The patient was provided an opportunity to ask questions and all were answered. The patient agreed with the plan and demonstrated an understanding of the instructions.   The patient was advised to call back or seek an in-person evaluation if the symptoms worsen or if the condition fails to improve as anticipated.  I provided 15 minutes of non-face-to-face time during this encounter.   [name], MD  [facility] MD/PA/NP OP Progress Note  05/18/2020 1:18 PM Rebecca [name]  MRN:  [id number]  Chief Complaint:  Chief Complaint    Anxiety; Depression; Follow-up     HPI: This patient is a 70 year old white female who lives with her husband and mother-in-law in [place].  She is a retired [employer] manager.  The patient returns after 3 months for follow-up regarding her anxiety and depression.  She states that she is doing very well.  She has had several injections in her back and her pain is much improved.  She is sleeping well and denies panic attacks anxiety or severe depression.  She denies suicidal ideation. Visit Diagnosis:    ICD-10-CM   1. Major depressive disorder, recurrent episode, moderate (HCC)  F33.1     Past Psychiatric History: 2 past psychiatric hospitalizations for depression  Past Medical History:  Past Medical History:  Diagnosis Date  . Allergy   . Amenia   . Anxiety   . Arthritis   . Chronic back pain   . Chronic kidney disease    cyst on bil kidneys  . Colitis, ischemic ([place])   .  Colon polyp   . Complication of anesthesia   . COPD (chronic obstructive pulmonary disease) ([place])   . Coronary artery disease   . Depression   . Dysphagia   . Fibromyalgia   . GERD (gastroesophageal reflux disease)   . Hiatal hernia   . Hyperlipidemia   . Hypertension   . IBS (irritable bowel syndrome)   . Myocardial infarction ([place])   . 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 Peripheral neuropathy   . Pneumonia   . PONV (postoperative nausea and vomiting)   . Schatzki's ring   . Sciatica   . Sensorineural hearing loss   . Stroke ([place])    T.I. Colitis  . Tinnitus   . UTI (urinary tract infection)     Past Surgical History:  Procedure Laterality Date  . ABDOMINAL HYSTERECTOMY    . BACK SURGERY    . BLADDER SURGERY    . BREAST SURGERY    . carple tunnal  surgery     Right hand  . CHOLECYSTECTOMY N/A 03/26/2018   Procedure: LAPAROSCOPIC CHOLECYSTECTOMY WITH ATTEMPTED INTRAOPERATIVE CHOLANGIOGRAM;  Surgeon: Donnie Mesa, MD;  Location: [facility];  Service: General;  Laterality: N/A;  . COLONOSCOPY  09/10/2005   in [place]  . DILATION AND CURETTAGE OF UTERUS    . NECK SURGERY     cyst on neck  . pillonidal cyst     resection  . TUBAL LIGATION    .  UPPER GI ENDOSCOPY  05/05/2011    Family Psychiatric History: see below  Family History:  Family History  Problem Relation Age of Onset  . Alcohol abuse Father   . Depression Father   . Cancer Father        lung  . Cancer Paternal Grandfather        lung  . Osteoporosis Maternal Grandmother   . Kyphosis Maternal Grandmother   . Stroke Maternal Grandfather   . Osteoporosis Mother   . Kyphosis Mother   . Stroke Mother   . Heart disease Mother   . Kidney disease Mother   . Sleep apnea Mother        said used oxygen at night   . Arthritis Brother   . Arthritis Brother   . Scoliosis Daughter   . Scoliosis Son   . Colon cancer Maternal Uncle   . Diabetes Maternal Uncle   . Irritable bowel syndrome  Maternal Uncle   . Esophageal cancer Neg Hx   . Stomach cancer Neg Hx     Social History:  Social History   Socioeconomic History  . Marital status: Married    Spouse name: Not on file  . Number of children: 2  . Years of education: Not on file  . Highest education level: Not on file  Occupational History  . Occupation: CNA  Tobacco Use  . Smoking status: Current Every Day Smoker    Packs/day: 1.00    Years: 44.00    Pack years: 44.00    Types: Cigarettes  . Smokeless tobacco: Never Used  . Tobacco comment: smokes 5 cig daily  Vaping Use  . Vaping Use: Never used  Substance and Sexual Activity  . Alcohol use: No    Alcohol/week: 0.0 standard drinks  . Drug use: No  . Sexual activity: Yes    Birth control/protection: Surgical    Comment: hyst  Other Topics Concern  . Not on file  Social History Narrative  . Not on file   Social Determinants of Health   Financial Resource Strain: Not on file  Food Insecurity: Not on file  Transportation Needs: Not on file  Physical Activity: Not on file  Stress: Not on file  Social Connections: Not on file    Allergies:  Allergies  Allergen Reactions  . Bupropion Hcl Swelling  . Doxycycline Other (See Comments)    When  she is on Valium   . Simvastatin Other (See Comments)    Sleepy and weak  . Toradol [Ketorolac Tromethamine] Other (See Comments)    Causes gi bleeding    . Cephalexin Other (See Comments)    Burning with urination  . Darvocet [Propoxyphene N-Acetaminophen] Anxiety  . Penicillins Swelling and Rash    Has patient had a PCN reaction causing immediate rash, facial/tongue/throat swelling, SOB or lightheadedness with hypotension: No Has patient had a PCN reaction causing severe rash involving mucus membranes or skin necrosis: No Has patient had a PCN reaction that required hospitalization: No Has patient had a PCN reaction occurring within the last 10 years: No If all of the above answers are "NO", then may  proceed with Cephalosporin use.     Metabolic Disorder Labs: No results found for: HGBA1C, MPG No results found for: PROLACTIN Lab Results  Component Value Date   CHOL 235 (H) 08/28/2008   TRIG 147 08/28/2008   HDL 52 08/28/2008   CHOLHDL 4.5 Ratio 08/28/2008   VLDL 29 08/28/2008   LDLCALC 154 (H)  08/28/2008   Lab Results  Component Value Date   TSH 1.511 08/28/2008   TSH 0.802 08/16/2007    Therapeutic Level Labs: No results found for: LITHIUM No results found for: VALPROATE No components found for:  CBMZ  Current Medications: Current Outpatient Medications  Medication Sig Dispense Refill  . amLODipine (NORVASC) 2.5 MG tablet Take by mouth.    Marland Kitchen acetaminophen (TYLENOL) 500 MG tablet Take 500 mg by mouth every 6 (six) hours as needed for moderate pain.    Marland Kitchen albuterol (PROVENTIL) (2.5 MG/3ML) 0.083% nebulizer solution Take 2.5 mg by nebulization every 6 (six) hours as needed for wheezing or shortness of breath.    . ALPRAZolam (XANAX) 0.5 MG tablet Take 1 tablet (0.5 mg total) by mouth 4 (four) times daily. 120 tablet 2  . betamethasone dipropionate (DIPROLENE) 0.05 % cream Apply 1 application topically 2 (two) times daily as needed (rash).     . cholestyramine (QUESTRAN) 4 GM/DOSE powder TAKE 4 GRAMS BY MOUTH MIXED IN WATER OF JUICE 3 TIMES DAILY WITH MEALS    . conjugated estrogens (PREMARIN) vaginal cream Place 1 Applicatorful vaginally daily.     . Cyanocobalamin (VITAMIN B-12 IJ) Inject 1,000 mcg as directed every 30 (thirty) days.     . diclofenac Sodium (VOLTAREN) 1 % GEL PLACE ONTO THE SKIN 4 (FOUR) TIMES A DAY AS NEEDED. APPLY 4G TO SITE AS DIRECTED    . fluticasone (FLONASE) 50 MCG/ACT nasal spray Place 1 spray into both nostrils daily.     . hydrOXYzine (ATARAX/VISTARIL) 25 MG tablet TAKE ONE TABLET (25 MG DOSE) BY MOUTH EVERY 6 (SIX) HOURS AS NEEDED FOR ITCHING.    . montelukast (SINGULAIR) 10 MG tablet Take 10 mg by mouth daily as needed (allergies).     .  nicotine (NICODERM CQ - DOSED IN MG/24 HR) 7 mg/24hr patch Place 7 mg onto the skin daily.    . promethazine (PHENERGAN) 25 MG tablet Take 25 mg by mouth every 6 (six) hours as needed for nausea or vomiting.    . ranitidine (ZANTAC) 150 MG tablet Take 150 mg by mouth at bedtime.  5  . sertraline (ZOLOFT) 50 MG tablet Take 1 tablet (50 mg total) by mouth 2 (two) times daily. 180 tablet 2  . sodium chloride (OCEAN) 0.65 % SOLN nasal spray Place 1 spray into both nostrils as needed for congestion.    . triamcinolone ointment (KENALOG) 0.5 % Apply 1 application topically 2 (two) times daily as needed (irritation).    Marland Kitchen umeclidinium-vilanterol (ANORO ELLIPTA) 62.5-25 MCG/INH AEPB Inhale 1 puff into the lungs daily as needed (shortness of breath).     No current facility-administered medications for this visit.     Musculoskeletal: Strength & Muscle Tone: within normal limits Gait & Station: normal Patient leans: N/A  Psychiatric Specialty Exam: Review of Systems  Musculoskeletal: Positive for back pain.  All other systems reviewed and are negative.   There were no vitals taken for this visit.There is no height or weight on file to calculate BMI.  General Appearance: Casual and Fairly Groomed  Eye Contact:  Good  Speech:  Clear and Coherent  Volume:  Normal  Mood:  Euthymic  Affect:  Appropriate and Congruent  Thought Process:  Goal Directed  Orientation:  Full (Time, Place, and Person)  Thought Content: WDL   Suicidal Thoughts:  No  Homicidal Thoughts:  No  Memory:  Immediate;   Good Recent;   Good Remote;   Good  Judgement:  Good  Insight:  Good  Psychomotor Activity:  Normal  Concentration:  Concentration: Good and Attention Span: Good  Recall:  Good  Fund of Knowledge: Good  Language: Good  Akathisia:  No  Handed:  Right  AIMS (if indicated): not done  Assets:  Communication Skills Desire for Improvement Resilience Social Support Talents/Skills  ADL's:  Intact   Cognition: WNL  Sleep:  Good   Screenings: PHQ2-9   [facility] Visit from 04/25/2017 in [facility]  PHQ-2 Total Score 0       Assessment and Plan: This patient is a 70 year old female with a history of depression and anxiety.  She continues to do well on her current regimen.  She will continue his Zoloft 50 mg twice daily for depression and panic attacks and Xanax 0.5 mg 4 times daily for anxiety.  She will return to see me in 3 months   [name], MD 05/18/2020, 1:18 PM

## 2020-08-16 ENCOUNTER — Telehealth (HOSPITAL_COMMUNITY): Payer: Medicare HMO | Admitting: Psychiatry

## 2020-08-24 ENCOUNTER — Encounter (HOSPITAL_COMMUNITY): Payer: Self-pay | Admitting: Psychiatry

## 2020-08-24 ENCOUNTER — Other Ambulatory Visit: Payer: Self-pay

## 2020-08-24 ENCOUNTER — Telehealth (INDEPENDENT_AMBULATORY_CARE_PROVIDER_SITE_OTHER): Payer: Medicare HMO | Admitting: Psychiatry

## 2020-08-24 DIAGNOSIS — F331 Major depressive disorder, recurrent, moderate: Secondary | ICD-10-CM | POA: Diagnosis not present

## 2020-08-24 MED ORDER — SERTRALINE HCL 50 MG PO TABS: 50.0000 mg | ORAL_TABLET | Freq: Two times a day (BID) | ORAL | 2 refills | Status: DC

## 2020-08-24 MED ORDER — ALPRAZOLAM 0.5 MG PO TABS: 0.5000 mg | ORAL_TABLET | Freq: Four times a day (QID) | ORAL | 2 refills | Status: DC

## 2020-08-24 NOTE — Progress Notes (Signed)
Virtual Visit via Video Note  I connected with Rebecca [name] on 08/24/20 at  1:40 PM EDT by a video enabled telemedicine application and verified that I am speaking with the correct person using two identifiers.  Location: Patient: home Provider: home office   I discussed the limitations of evaluation and management by telemedicine and the availability of in person appointments. The patient expressed understanding and agreed to proceed.    I discussed the assessment and treatment plan with the patient. The patient was provided an opportunity to ask questions and all were answered. The patient agreed with the plan and demonstrated an understanding of the instructions.   The patient was advised to call back or seek an in-person evaluation if the symptoms worsen or if the condition fails to improve as anticipated.  I provided 15 minutes of non-face-to-face time during this encounter.   [name], MD  [facility] MD/PA/NP OP Progress Note  08/24/2020 2:06 PM Rebecca [name]  MRN:  [id number]  Chief Complaint:  Chief Complaint    Anxiety; Depression; Follow-up     HPI: This patient is a 70 year old white female who lives with her husband and mother-in-law in [place]. She is a retired [employer] manager  Patient returns for follow-up after 3 months regarding her anxiety depression.  She states that her husband had a stroke in March and he lost some of his field of vision but is gradually coming back and he is now able to drive.  He is now on blood thinners.  She is trying to help out more around the house doing yard work and mowing.  She enjoys the exercise.  Her mood is good and her anxiety is under good control.  She is sleeping well and denies severe panic attacks or suicidal ideation Visit Diagnosis:    ICD-10-CM   1. Major depressive disorder, recurrent episode, moderate (HCC)  F33.1     Past Psychiatric History: 2 past psychiatric hospitalizations for depression  Past Medical History:  Past  Medical History:  Diagnosis Date  . Allergy   . Amenia   . Anxiety   . Arthritis   . Chronic back pain   . Chronic kidney disease    cyst on bil kidneys  . Colitis, ischemic ([place])   . Colon polyp   . Complication of anesthesia   . COPD (chronic obstructive pulmonary disease) ([place])   . Coronary artery disease   . Depression   . Dysphagia   . Fibromyalgia   . GERD (gastroesophageal reflux disease)   . Hiatal hernia   . Hyperlipidemia   . Hypertension   . IBS (irritable bowel syndrome)   . Myocardial infarction ([place])   . 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 Peripheral neuropathy   . Pneumonia   . PONV (postoperative nausea and vomiting)   . Schatzki's ring   . Sciatica   . Sensorineural hearing loss   . Stroke ([place])    T.I. Colitis  . Tinnitus   . UTI (urinary tract infection)     Past Surgical History:  Procedure Laterality Date  . ABDOMINAL HYSTERECTOMY    . BACK SURGERY    . BLADDER SURGERY    . BREAST SURGERY    . carple tunnal  surgery     Right hand  . CHOLECYSTECTOMY N/A 03/26/2018   Procedure: LAPAROSCOPIC CHOLECYSTECTOMY WITH ATTEMPTED INTRAOPERATIVE CHOLANGIOGRAM;  Surgeon: Donnie Mesa, MD;  Location: [facility];  Service: General;  Laterality: N/A;  . COLONOSCOPY  09/10/2005   in [place]  . DILATION AND CURETTAGE OF UTERUS    . NECK SURGERY     cyst on neck  . pillonidal cyst     resection  . TUBAL LIGATION    . UPPER GI ENDOSCOPY  05/05/2011    Family Psychiatric History: see below  Family History:  Family History  Problem Relation Age of Onset  . Alcohol abuse Father   . Depression Father   . Cancer Father        lung  . Cancer Paternal Grandfather        lung  . Osteoporosis Maternal Grandmother   . Kyphosis Maternal Grandmother   . Stroke Maternal Grandfather   . Osteoporosis Mother   . Kyphosis Mother   . Stroke Mother   . Heart disease Mother   . Kidney disease Mother   . Sleep apnea Mother        said used oxygen  at night   . Arthritis Brother   . Arthritis Brother   . Scoliosis Daughter   . Scoliosis Son   . Colon cancer Maternal Uncle   . Diabetes Maternal Uncle   . Irritable bowel syndrome Maternal Uncle   . Esophageal cancer Neg Hx   . Stomach cancer Neg Hx     Social History:  Social History   Socioeconomic History  . Marital status: Married    Spouse name: Not on file  . Number of children: 2  . Years of education: Not on file  . Highest education level: Not on file  Occupational History  . Occupation: CNA  Tobacco Use  . Smoking status: Current Every Day Smoker    Packs/day: 1.00    Years: 44.00    Pack years: 44.00    Types: Cigarettes  . Smokeless tobacco: Never Used  . Tobacco comment: smokes 5 cig daily  Vaping Use  . Vaping Use: Never used  Substance and Sexual Activity  . Alcohol use: No    Alcohol/week: 0.0 standard drinks  . Drug use: No  . Sexual activity: Yes    Birth control/protection: Surgical    Comment: hyst  Other Topics Concern  . Not on file  Social History Narrative  . Not on file   Social Determinants of Health   Financial Resource Strain: Not on file  Food Insecurity: Not on file  Transportation Needs: Not on file  Physical Activity: Not on file  Stress: Not on file  Social Connections: Not on file    Allergies:  Allergies  Allergen Reactions  . Bupropion Hcl Swelling  . Doxycycline Other (See Comments)    When  she is on Valium   . Simvastatin Other (See Comments)    Sleepy and weak  . Toradol [Ketorolac Tromethamine] Other (See Comments)    Causes gi bleeding    . Cephalexin Other (See Comments)    Burning with urination  . Darvocet [Propoxyphene N-Acetaminophen] Anxiety  . Penicillins Swelling and Rash    Has patient had a PCN reaction causing immediate rash, facial/tongue/throat swelling, SOB or lightheadedness with hypotension: No Has patient had a PCN reaction causing severe rash involving mucus membranes or skin  necrosis: No Has patient had a PCN reaction that required hospitalization: No Has patient had a PCN reaction occurring within the last 10 years: No If all of the above answers are "NO", then may proceed with Cephalosporin use.     Metabolic Disorder Labs: No results found for: HGBA1C, MPG No  results found for: PROLACTIN Lab Results  Component Value Date   CHOL 235 (H) 08/28/2008   TRIG 147 08/28/2008   HDL 52 08/28/2008   CHOLHDL 4.5 Ratio 08/28/2008   VLDL 29 08/28/2008   LDLCALC 154 (H) 08/28/2008   Lab Results  Component Value Date   TSH 1.511 08/28/2008   TSH 0.802 08/16/2007    Therapeutic Level Labs: No results found for: LITHIUM No results found for: VALPROATE No components found for:  CBMZ  Current Medications: Current Outpatient Medications  Medication Sig Dispense Refill  . methylPREDNISolone (MEDROL DOSEPAK) 4 MG TBPK tablet See admin instructions.    Marland Kitchen acetaminophen (TYLENOL) 500 MG tablet Take 500 mg by mouth every 6 (six) hours as needed for moderate pain.    Marland Kitchen albuterol (PROVENTIL) (2.5 MG/3ML) 0.083% nebulizer solution Take 2.5 mg by nebulization every 6 (six) hours as needed for wheezing or shortness of breath.    . ALPRAZolam (XANAX) 0.5 MG tablet Take 1 tablet (0.5 mg total) by mouth 4 (four) times daily. 120 tablet 2  . amLODipine (NORVASC) 2.5 MG tablet Take by mouth.    . betamethasone dipropionate (DIPROLENE) 0.05 % cream Apply 1 application topically 2 (two) times daily as needed (rash).     . cholestyramine (QUESTRAN) 4 GM/DOSE powder TAKE 4 GRAMS BY MOUTH MIXED IN WATER OF JUICE 3 TIMES DAILY WITH MEALS    . conjugated estrogens (PREMARIN) vaginal cream Place 1 Applicatorful vaginally daily.     . Cyanocobalamin (VITAMIN B-12 IJ) Inject 1,000 mcg as directed every 30 (thirty) days.     . diclofenac Sodium (VOLTAREN) 1 % GEL PLACE ONTO THE SKIN 4 (FOUR) TIMES A DAY AS NEEDED. APPLY 4G TO SITE AS DIRECTED    . fluticasone (FLONASE) 50 MCG/ACT nasal  spray Place 1 spray into both nostrils daily.     . hydrOXYzine (ATARAX/VISTARIL) 25 MG tablet TAKE ONE TABLET (25 MG DOSE) BY MOUTH EVERY 6 (SIX) HOURS AS NEEDED FOR ITCHING.    . montelukast (SINGULAIR) 10 MG tablet Take 10 mg by mouth daily as needed (allergies).     . nicotine (NICODERM CQ - DOSED IN MG/24 HR) 7 mg/24hr patch Place 7 mg onto the skin daily.    . promethazine (PHENERGAN) 25 MG tablet Take 25 mg by mouth every 6 (six) hours as needed for nausea or vomiting.    . ranitidine (ZANTAC) 150 MG tablet Take 150 mg by mouth at bedtime.  5  . sertraline (ZOLOFT) 50 MG tablet Take 1 tablet (50 mg total) by mouth 2 (two) times daily. 180 tablet 2  . sodium chloride (OCEAN) 0.65 % SOLN nasal spray Place 1 spray into both nostrils as needed for congestion.    . triamcinolone ointment (KENALOG) 0.5 % Apply 1 application topically 2 (two) times daily as needed (irritation).    Marland Kitchen umeclidinium-vilanterol (ANORO ELLIPTA) 62.5-25 MCG/INH AEPB Inhale 1 puff into the lungs daily as needed (shortness of breath).     No current facility-administered medications for this visit.     Musculoskeletal: Strength & Muscle Tone: within normal limits Gait & Station: normal Patient leans: N/A  Psychiatric Specialty Exam: Review of Systems  All other systems reviewed and are negative.   There were no vitals taken for this visit.There is no height or weight on file to calculate BMI.  General Appearance: Casual and Fairly Groomed  Eye Contact:  Good  Speech:  Clear and Coherent  Volume:  Normal  Mood:  Euthymic  Affect:  Appropriate and Congruent  Thought Process:  Goal Directed  Orientation:  Full (Time, Place, and Person)  Thought Content: WDL   Suicidal Thoughts:  No  Homicidal Thoughts:  No  Memory:  Immediate;   Good Recent;   Good Remote;   Good  Judgement:  Good  Insight:  Fair  Psychomotor Activity:  Normal  Concentration:  Concentration: Good and Attention Span: Good  Recall:  Good   Fund of Knowledge: Good  Language: Good  Akathisia:  No  Handed:  Right  AIMS (if indicated): not done  Assets:  Communication Skills Desire for Improvement Physical Health Resilience Social Support Talents/Skills  ADL's:  Intact  Cognition: WNL  Sleep:  Good   Screenings: PHQ2-9   Flowsheet Row Video Visit from 08/24/2020 in [facility] Office Visit from 04/25/2017 in [facility]  PHQ-2 Total Score 0 0    Flowsheet Row Video Visit from 08/24/2020 in [facility] No [facility]       Assessment and Plan: This patient is a 70 year old female with a history of depression and anxiety.  She continues to do well on her current regimen.  She will continue Zoloft 50 mg twice daily for depression and panic attacks and Xanax 0.5 mg 4 times daily for anxiety.  She will return to see me in 3 months   [name], MD 08/24/2020, 2:06 PM

## 2020-09-29 ENCOUNTER — Other Ambulatory Visit (HOSPITAL_COMMUNITY): Payer: Self-pay | Admitting: Psychiatry

## 2020-09-29 NOTE — Telephone Encounter (Signed)
Rx request came for pt Rebecca [name]. Per pt chart, patient Rebecca [name] was last filled on 08-24-2020 with 2 refills. Staff called patient and informed her and pt stated she will call pharmacy to request refills due to just calling in refills via her bottle. Staff informed patient that request for Rebecca [name] that was sent today will be denied and patient verbalized understanding and agreed. This information will be sent to both Dr. De Nurse and Dr. Harrington Challenger.

## 2021-01-05 ENCOUNTER — Telehealth (INDEPENDENT_AMBULATORY_CARE_PROVIDER_SITE_OTHER): Payer: Medicare HMO | Admitting: Psychiatry

## 2021-01-05 ENCOUNTER — Other Ambulatory Visit: Payer: Self-pay

## 2021-01-05 ENCOUNTER — Encounter (HOSPITAL_COMMUNITY): Payer: Self-pay | Admitting: Psychiatry

## 2021-01-05 DIAGNOSIS — F331 Major depressive disorder, recurrent, moderate: Secondary | ICD-10-CM | POA: Diagnosis not present

## 2021-01-05 MED ORDER — ALPRAZOLAM 0.5 MG PO TABS: 0.5000 mg | ORAL_TABLET | Freq: Four times a day (QID) | ORAL | 2 refills | Status: DC

## 2021-01-05 MED ORDER — SERTRALINE HCL 50 MG PO TABS: 50.0000 mg | ORAL_TABLET | Freq: Two times a day (BID) | ORAL | 2 refills | Status: DC

## 2021-01-05 NOTE — Progress Notes (Signed)
Virtual Visit via Video Note  I connected with Rebecca [name] on 01/05/21 at  1:00 PM EDT by a video enabled telemedicine application and verified that I am speaking with the correct person using two identifiers.  Location: Patient: home Provider: home office   I discussed the limitations of evaluation and management by telemedicine and the availability of in person appointments. The patient expressed understanding and agreed to proceed.     I discussed the assessment and treatment plan with the patient. The patient was provided an opportunity to ask questions and all were answered. The patient agreed with the plan and demonstrated an understanding of the instructions.   The patient was advised to call back or seek an in-person evaluation if the symptoms worsen or if the condition fails to improve as anticipated.  I provided 14 minutes of non-face-to-face time during this encounter.   Rebecca Spiller, MD  [facility] MD/PA/NP OP Progress Note  01/05/2021 1:18 PM Rebecca [name]  MRN:  [id number]  Chief Complaint:  Chief Complaint   Anxiety; Depression; Follow-up    HPI: This patient is a 70 year old white female who lives with her husband and mother-in-law in [place].  She is a retired [employer] manager  The patient returns for follow-up after 3 months regarding her anxiety and depression.  She reports that she is just now getting over a UTI and shingles on her chest and abdomen.  She is still fatigued but for the most part doing better.  She denies significant depression anxiety panic attacks or difficulty sleeping.  She denies suicidal ideation Visit Diagnosis:    ICD-10-CM   1. Major depressive disorder, recurrent episode, moderate (HCC)  F33.1       Past Psychiatric History: 2 past psychiatric hospitalizations for depression  Past Medical History:  Past Medical History:  Diagnosis Date   Allergy    Amenia    Anxiety    Arthritis    Chronic back pain    Chronic kidney disease    cyst on  bil kidneys   Colitis, ischemic (HCC)    Colon polyp    Complication of anesthesia    COPD (chronic obstructive pulmonary disease) (HCC)    Coronary artery disease    Depression    Dysphagia    Fibromyalgia    GERD (gastroesophageal reflux disease)    Hiatal hernia    Hyperlipidemia    Hypertension    IBS (irritable bowel syndrome)    Myocardial infarction ([place])    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 Colitis   Tinnitus    UTI (urinary tract infection)     Past Surgical History:  Procedure Laterality Date   ABDOMINAL HYSTERECTOMY     BACK SURGERY     BLADDER SURGERY     BREAST SURGERY     carple tunnal  surgery     Right hand   CHOLECYSTECTOMY N/A 03/26/2018   Procedure: LAPAROSCOPIC CHOLECYSTECTOMY WITH ATTEMPTED INTRAOPERATIVE CHOLANGIOGRAM;  Surgeon: Donnie Mesa, MD;  Location: [place];  Service: General;  Laterality: N/A;   COLONOSCOPY  09/10/2005   in [place]     cyst on neck   pillonidal cyst  resection   TUBAL LIGATION     UPPER GI ENDOSCOPY  05/05/2011    Family Psychiatric History: see below  Family History:  Family History  Problem Relation Age of Onset   Alcohol abuse Father    Depression Father    Cancer Father        lung   Cancer Paternal Grandfather        lung   Osteoporosis Maternal Grandmother    Kyphosis Maternal Grandmother    Stroke Maternal Grandfather    Osteoporosis Mother    Kyphosis Mother    Stroke Mother    Heart disease Mother    Kidney disease Mother    Sleep apnea Mother        said used oxygen at night    Arthritis Brother    Arthritis Brother    Scoliosis Daughter    Scoliosis Son    Colon cancer Maternal Uncle    Diabetes Maternal Uncle    Irritable bowel syndrome Maternal Uncle     Esophageal cancer Neg Hx    Stomach cancer Neg Hx     Social History:  Social History   Socioeconomic History   Marital status: Married    Spouse name: Not on file   Number of children: 2   Years of education: Not on file   Highest education level: Not on file  Occupational History   Occupation: CNA  Tobacco Use   Smoking status: Every Day    Packs/day: 1.00    Years: 44.00    Pack years: 44.00    Types: Cigarettes   Smokeless tobacco: Never   Tobacco comments:    smokes 5 cig daily  Vaping Use   Vaping Use: Never used  Substance and Sexual Activity   Alcohol use: No    Alcohol/week: 0.0 standard drinks   Drug use: No   Sexual activity: Yes    Birth control/protection: Surgical    Comment: hyst  Other Topics Concern   Not on file  Social History Narrative   Not on file   Social Determinants of Health   Financial Resource Strain: Not on file  Food Insecurity: Not on file  Transportation Needs: Not on file  Physical Activity: Not on file  Stress: Not on file  Social Connections: Not on file    Allergies:  Allergies  Allergen Reactions   Bupropion Hcl Swelling   Doxycycline Other (See Comments)    When  she is on Valium    Simvastatin Other (See Comments)    Sleepy and weak   Toradol [Ketorolac Tromethamine] Other (See Comments)    Causes gi bleeding     Cephalexin Other (See Comments)    Burning with urination   Darvocet [Propoxyphene N-Acetaminophen] Anxiety   Penicillins Swelling and Rash    Has patient had a PCN reaction causing immediate rash, facial/tongue/throat swelling, SOB or lightheadedness with hypotension: No Has patient had a PCN reaction causing severe rash involving mucus membranes or skin necrosis: No Has patient had a PCN reaction that required hospitalization: No Has patient had a PCN reaction occurring within the last 10 years: No If all of the above answers are "NO", then may proceed with Cephalosporin use.     Metabolic  Disorder Labs: No results found for: HGBA1C, MPG No results found for: PROLACTIN Lab Results  Component Value Date   CHOL 235 (H) 08/28/2008   TRIG 147 08/28/2008   HDL 52 08/28/2008   CHOLHDL 4.5 Ratio 08/28/2008  VLDL 29 08/28/2008   LDLCALC 154 (H) 08/28/2008   Lab Results  Component Value Date   TSH 1.511 08/28/2008   TSH 0.802 08/16/2007    Therapeutic Level Labs: No results found for: LITHIUM No results found for: VALPROATE No components found for:  CBMZ  Current Medications: Current Outpatient Medications  Medication Sig Dispense Refill   acetaminophen (TYLENOL) 500 MG tablet Take 500 mg by mouth every 6 (six) hours as needed for moderate pain.     albuterol (PROVENTIL) (2.5 MG/3ML) 0.083% nebulizer solution Take 2.5 mg by nebulization every 6 (six) hours as needed for wheezing or shortness of breath.     ALPRAZolam (XANAX) 0.5 MG tablet Take 1 tablet (0.5 mg total) by mouth 4 (four) times daily. 120 tablet 2   amLODipine (NORVASC) 2.5 MG tablet Take by mouth.     betamethasone dipropionate (DIPROLENE) 0.05 % cream Apply 1 application topically 2 (two) times daily as needed (rash).      cholestyramine (QUESTRAN) 4 GM/DOSE powder TAKE 4 GRAMS BY MOUTH MIXED IN WATER OF JUICE 3 TIMES DAILY WITH MEALS     conjugated estrogens (PREMARIN) vaginal cream Place 1 Applicatorful vaginally daily.      Cyanocobalamin (VITAMIN B-12 IJ) Inject 1,000 mcg as directed every 30 (thirty) days.      diclofenac Sodium (VOLTAREN) 1 % GEL PLACE ONTO THE SKIN 4 (FOUR) TIMES A DAY AS NEEDED. APPLY 4G TO SITE AS DIRECTED     fluticasone (FLONASE) 50 MCG/ACT nasal spray Place 1 spray into both nostrils daily.      hydrOXYzine (ATARAX/VISTARIL) 25 MG tablet TAKE ONE TABLET (25 MG DOSE) BY MOUTH EVERY 6 (SIX) HOURS AS NEEDED FOR ITCHING.     methylPREDNISolone (MEDROL DOSEPAK) 4 MG TBPK tablet See admin instructions.     montelukast (SINGULAIR) 10 MG tablet Take 10 mg by mouth daily as needed  (allergies).      nicotine (NICODERM CQ - DOSED IN MG/24 HR) 7 mg/24hr patch Place 7 mg onto the skin daily.     promethazine (PHENERGAN) 25 MG tablet Take 25 mg by mouth every 6 (six) hours as needed for nausea or vomiting.     ranitidine (ZANTAC) 150 MG tablet Take 150 mg by mouth at bedtime.  5   sertraline (ZOLOFT) 50 MG tablet Take 1 tablet (50 mg total) by mouth 2 (two) times daily. 180 tablet 2   sodium chloride (OCEAN) 0.65 % SOLN nasal spray Place 1 spray into both nostrils as needed for congestion.     triamcinolone ointment (KENALOG) 0.5 % Apply 1 application topically 2 (two) times daily as needed (irritation).     umeclidinium-vilanterol (ANORO ELLIPTA) 62.5-25 MCG/INH AEPB Inhale 1 puff into the lungs daily as needed (shortness of breath).     No current facility-administered medications for this visit.     Musculoskeletal: Strength & Muscle Tone: within normal limits Gait & Station: normal Patient leans: N/A  Psychiatric Specialty Exam: Review of Systems  Musculoskeletal:  Positive for arthralgias.  All other systems reviewed and are negative.  There were no vitals taken for this visit.There is no height or weight on file to calculate BMI.  General Appearance: Casual and Fairly Groomed  Eye Contact:  Good  Speech:  Clear and Coherent  Volume:  Normal  Mood:  Euthymic  Affect:  Appropriate and Congruent  Thought Process:  Goal Directed  Orientation:  Full (Time, Place, and Person)  Thought Content: WDL   Suicidal Thoughts:  No  Homicidal Thoughts:  No  Memory:  Immediate;   Good Recent;   Good Remote;   Good  Judgement:  Good  Insight:  Good  Psychomotor Activity:  Decreased  Concentration:  Concentration: Good and Attention Span: Good  Recall:  Good  Fund of Knowledge: Good  Language: Good  Akathisia:  No  Handed:  Right  AIMS (if indicated): not done  Assets:  Communication Skills Desire for Improvement Resilience Social Support Talents/Skills   ADL's:  Intact  Cognition: WNL  Sleep:  Good   Screenings: PHQ2-9    Flowsheet Row Video Visit from 01/05/2021 in [place] Video Visit from 08/24/2020 in [place] Office Visit from 04/25/2017 in [facility]  PHQ-2 Total Score 0 0 0      Flowsheet Row Video Visit from 08/24/2020 in [place] No Risk        Assessment and Plan: Patient is a 70 year old female with a history of depression and anxiety.  She continues to do very well on her current regimen she will continue Zoloft 50 mg twice daily for depression and anxiety and Xanax 0.5 mg 4 times daily for anxiety as well.  She will return to see me in 3 months   Rebecca Spiller, MD 01/05/2021, 1:18 PM

## 2021-05-18 ENCOUNTER — Other Ambulatory Visit: Payer: Self-pay

## 2021-05-18 ENCOUNTER — Telehealth (INDEPENDENT_AMBULATORY_CARE_PROVIDER_SITE_OTHER): Payer: Medicare HMO | Admitting: Psychiatry

## 2021-05-18 ENCOUNTER — Encounter (HOSPITAL_COMMUNITY): Payer: Self-pay | Admitting: Psychiatry

## 2021-05-18 DIAGNOSIS — F331 Major depressive disorder, recurrent, moderate: Secondary | ICD-10-CM | POA: Diagnosis not present

## 2021-05-18 MED ORDER — ALPRAZOLAM 0.5 MG PO TABS: 0.5000 mg | ORAL_TABLET | Freq: Four times a day (QID) | ORAL | 2 refills | Status: DC

## 2021-05-18 MED ORDER — SERTRALINE HCL 50 MG PO TABS: 50.0000 mg | ORAL_TABLET | Freq: Two times a day (BID) | ORAL | 2 refills | Status: DC

## 2021-05-18 NOTE — Progress Notes (Signed)
Virtual Visit via Telephone Note  I connected with Rebecca [name] on 05/18/21 at  2:20 PM EST by telephone and verified that I am speaking with the correct person using two identifiers.  Location: Patient: home Provider: home office   I discussed the limitations, risks, security and privacy concerns of performing an evaluation and management service by telephone and the availability of in person appointments. I also discussed with the patient that there may be a patient responsible charge related to this service. The patient expressed understanding and agreed to proceed.      I discussed the assessment and treatment plan with the patient. The patient was provided an opportunity to ask questions and all were answered. The patient agreed with the plan and demonstrated an understanding of the instructions.   The patient was advised to call back or seek an in-person evaluation if the symptoms worsen or if the condition fails to improve as anticipated.  I provided 15 minutes of non-face-to-face time during this encounter.   [name], MD  [facility] MD/PA/NP OP Progress Note  05/18/2021 2:36 PM Rebecca [name]  MRN:  [id number]  Chief Complaint:  Chief Complaint   Anxiety; Depression; Follow-up    HPI: This patient is a 71 year old white female who lives with her husband and mother-in-law in [place].  She is a retired [employer] manager  Patient returns for follow-up after 3 months regarding her anxiety depression.  For the most part she is doing well.  She is still having back pain and is scheduled to have a renal CT.  Her ultrasound showed a cyst.  She is also had some blood in her urine.  The time she gets down about her health issues but most the time her mood is good.  Occasionally she has a panic attack but there are very few and far between.  She feels that her medications are working well for her depression and anxiety. Visit Diagnosis:    ICD-10-CM   1. Major depressive disorder, recurrent  episode, moderate (HCC)  F33.1       Past Psychiatric History: 2 past psychiatric hospitalizations for depression  Past Medical History:  Past Medical History:  Diagnosis Date   Allergy    Amenia    Anxiety    Arthritis    Chronic back pain    Chronic kidney disease    cyst on bil kidneys   Colitis, ischemic (HCC)    Colon polyp    Complication of anesthesia    COPD (chronic obstructive pulmonary disease) (HCC)    Coronary artery disease    Depression    Dysphagia    Fibromyalgia    GERD (gastroesophageal reflux disease)    Hiatal hernia    Hyperlipidemia    Hypertension    IBS (irritable bowel syndrome)    Myocardial infarction (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 Colitis   Tinnitus    UTI (urinary tract infection)     Past Surgical History:  Procedure Laterality Date   ABDOMINAL HYSTERECTOMY     BACK SURGERY     BLADDER SURGERY     BREAST SURGERY     carple tunnal  surgery     Right hand   CHOLECYSTECTOMY N/A 03/26/2018  Procedure: LAPAROSCOPIC CHOLECYSTECTOMY WITH ATTEMPTED INTRAOPERATIVE CHOLANGIOGRAM;  Surgeon: Donnie Mesa, MD;  Location: [place];  Service: General;  Laterality: N/A;   COLONOSCOPY  09/10/2005   in [name]     cyst on neck   pillonidal cyst     resection   TUBAL LIGATION     UPPER GI ENDOSCOPY  05/05/2011    Family Psychiatric History: see below  Family History:  Family History  Problem Relation Age of Onset   Alcohol abuse Father    Depression Father    Cancer Father        lung   Cancer Paternal Grandfather        lung   Osteoporosis Maternal Grandmother    Kyphosis Maternal Grandmother    Stroke Maternal Grandfather    Osteoporosis Mother    Kyphosis Mother    Stroke Mother     Heart disease Mother    Kidney disease Mother    Sleep apnea Mother        said used oxygen at night    Arthritis Brother    Arthritis Brother    Scoliosis Daughter    Scoliosis Son    Colon cancer Maternal Uncle    Diabetes Maternal Uncle    Irritable bowel syndrome Maternal Uncle    Esophageal cancer Neg Hx    Stomach cancer Neg Hx     Social History:  Social History   Socioeconomic History   Marital status: Married    Spouse name: Not on file   Number of children: 2   Years of education: Not on file   Highest education level: Not on file  Occupational History   Occupation: CNA  Tobacco Use   Smoking status: Every Day    Packs/day: 1.00    Years: 44.00    Pack years: 44.00    Types: Cigarettes   Smokeless tobacco: Never   Tobacco comments:    smokes 5 cig daily  Vaping Use   Vaping Use: Never used  Substance and Sexual Activity   Alcohol use: No    Alcohol/week: 0.0 standard drinks   Drug use: No   Sexual activity: Yes    Birth control/protection: Surgical    Comment: hyst  Other Topics Concern   Not on file  Social History Narrative   Not on file   Social Determinants of Health   Financial Resource Strain: Not on file  Food Insecurity: Not on file  Transportation Needs: Not on file  Physical Activity: Not on file  Stress: Not on file  Social Connections: Not on file    Allergies:  Allergies  Allergen Reactions   Bupropion Hcl Swelling   Doxycycline Other (See Comments)    When  she is on Valium    Simvastatin Other (See Comments)    Sleepy and weak   Toradol [Ketorolac Tromethamine] Other (See Comments)    Causes gi bleeding     Cephalexin Other (See Comments)    Burning with urination   Darvocet [Propoxyphene N-Acetaminophen] Anxiety   Penicillins Swelling and Rash    Has patient had a PCN reaction causing immediate rash, facial/tongue/throat swelling, SOB or lightheadedness with hypotension: No Has patient had a PCN reaction causing  severe rash involving mucus membranes or skin necrosis: No Has patient had a PCN reaction that required hospitalization: No Has patient had a PCN reaction occurring within the last 10 years: No If all  of the above answers are "NO", then may proceed with Cephalosporin use.     Metabolic Disorder Labs: No results found for: HGBA1C, MPG No results found for: PROLACTIN Lab Results  Component Value Date   CHOL 235 (H) 08/28/2008   TRIG 147 08/28/2008   HDL 52 08/28/2008   CHOLHDL 4.5 Ratio 08/28/2008   VLDL 29 08/28/2008   LDLCALC 154 (H) 08/28/2008   Lab Results  Component Value Date   TSH 1.511 08/28/2008   TSH 0.802 08/16/2007    Therapeutic Level Labs: No results found for: LITHIUM No results found for: VALPROATE No components found for:  CBMZ  Current Medications: Current Outpatient Medications  Medication Sig Dispense Refill   acetaminophen (TYLENOL) 500 MG tablet Take 500 mg by mouth every 6 (six) hours as needed for moderate pain.     albuterol (PROVENTIL) (2.5 MG/3ML) 0.083% nebulizer solution Take 2.5 mg by nebulization every 6 (six) hours as needed for wheezing or shortness of breath.     ALPRAZolam (XANAX) 0.5 MG tablet Take 1 tablet (0.5 mg total) by mouth 4 (four) times daily. 120 tablet 2   amLODipine (NORVASC) 2.5 MG tablet Take by mouth.     betamethasone dipropionate (DIPROLENE) 0.05 % cream Apply 1 application topically 2 (two) times daily as needed (rash).      cholestyramine (QUESTRAN) 4 GM/DOSE powder TAKE 4 GRAMS BY MOUTH MIXED IN WATER OF JUICE 3 TIMES DAILY WITH MEALS     conjugated estrogens (PREMARIN) vaginal cream Place 1 Applicatorful vaginally daily.      Cyanocobalamin (VITAMIN B-12 IJ) Inject 1,000 mcg as directed every 30 (thirty) days.      diclofenac Sodium (VOLTAREN) 1 % GEL PLACE ONTO THE SKIN 4 (FOUR) TIMES A DAY AS NEEDED. APPLY 4G TO SITE AS DIRECTED     fluticasone (FLONASE) 50 MCG/ACT nasal spray Place 1 spray into both nostrils daily.       hydrOXYzine (ATARAX/VISTARIL) 25 MG tablet TAKE ONE TABLET (25 MG DOSE) BY MOUTH EVERY 6 (SIX) HOURS AS NEEDED FOR ITCHING.     methylPREDNISolone (MEDROL DOSEPAK) 4 MG TBPK tablet See admin instructions.     montelukast (SINGULAIR) 10 MG tablet Take 10 mg by mouth daily as needed (allergies).      nicotine (NICODERM CQ - DOSED IN MG/24 HR) 7 mg/24hr patch Place 7 mg onto the skin daily.     promethazine (PHENERGAN) 25 MG tablet Take 25 mg by mouth every 6 (six) hours as needed for nausea or vomiting.     ranitidine (ZANTAC) 150 MG tablet Take 150 mg by mouth at bedtime.  5   sertraline (ZOLOFT) 50 MG tablet Take 1 tablet (50 mg total) by mouth 2 (two) times daily. 180 tablet 2   sodium chloride (OCEAN) 0.65 % SOLN nasal spray Place 1 spray into both nostrils as needed for congestion.     triamcinolone ointment (KENALOG) 0.5 % Apply 1 application topically 2 (two) times daily as needed (irritation).     umeclidinium-vilanterol (ANORO ELLIPTA) 62.5-25 MCG/INH AEPB Inhale 1 puff into the lungs daily as needed (shortness of breath).     No current facility-administered medications for this visit.     Musculoskeletal: Strength & Muscle Tone: na Gait & Station: na Patient leans: N/A  Psychiatric Specialty Exam: Review of Systems  Musculoskeletal:  Positive for back pain.  All other systems reviewed and are negative.  There were no vitals taken for this visit.There is no height or weight on file  to calculate BMI.  General Appearance: NA  Eye Contact:  NA  Speech:  normal  Volume:  Normal  Mood:  Euthymic  Affect:  NA  Thought Process:  Goal Directed  Orientation:  Full (Time, Place, and Person)  Thought Content: WDL   Suicidal Thoughts:  No  Homicidal Thoughts:  No  Memory:  Immediate;   Good Recent;   Good Remote;   Good  Judgement:  Good  Insight:  Fair  Psychomotor Activity:  Normal  Concentration:  Concentration: Good and Attention Span: Good  Recall:  Good  Fund of  Knowledge: Good  Language: Good  Akathisia:  No  Handed:  Right  AIMS (if indicated): not done  Assets:  Communication Skills Desire for Improvement Resilience Social Support Talents/Skills  ADL's:  Intact  Cognition: WNL  Sleep:  Good   Screenings: PHQ2-9    Flowsheet Row Video Visit from 05/18/2021 in [facility] Video Visit from 01/05/2021 in [place] Video Visit from 08/24/2020 in [place] Office Visit from 04/25/2017 in [facility]  PHQ-2 Total Score 0 0 0 0      Flowsheet Row Video Visit from 05/18/2021 in [facility] Video Visit from 08/24/2020 in [place] No Risk No Risk        Assessment and Plan: Patient is a 71 year old female with a history of depression and anxiety.  She continues to do well on her current regimen.  She will continue Zoloft 50 mg twice daily for depression and anxiety and Xanax 0.5 mg 4 times daily for anxiety.  She will return to see me in 3 months   [name], MD 05/18/2021, 2:36 PM

## 2021-09-14 ENCOUNTER — Encounter (HOSPITAL_COMMUNITY): Payer: Self-pay | Admitting: Psychiatry

## 2021-09-14 ENCOUNTER — Telehealth (INDEPENDENT_AMBULATORY_CARE_PROVIDER_SITE_OTHER): Payer: Medicare HMO | Admitting: Psychiatry

## 2021-09-14 DIAGNOSIS — F341 Dysthymic disorder: Secondary | ICD-10-CM

## 2021-09-14 DIAGNOSIS — F331 Major depressive disorder, recurrent, moderate: Secondary | ICD-10-CM | POA: Diagnosis not present

## 2021-09-14 MED ORDER — SERTRALINE HCL 50 MG PO TABS: 50.0000 mg | ORAL_TABLET | Freq: Two times a day (BID) | ORAL | 2 refills | Status: DC

## 2021-09-14 MED ORDER — ALPRAZOLAM 0.5 MG PO TABS: 0.5000 mg | ORAL_TABLET | Freq: Four times a day (QID) | ORAL | 2 refills | Status: DC

## 2021-09-14 NOTE — Progress Notes (Signed)
Virtual Visit via Telephone Note  I connected with Rebecca [name] on 09/14/21 at 10:00 AM EDT by telephone and verified that I am speaking with the correct person using two identifiers.  Location: Patient: home Provider: office   I discussed the limitations, risks, security and privacy concerns of performing an evaluation and management service by telephone and the availability of in person appointments. I also discussed with the patient that there may be a patient responsible charge related to this service. The patient expressed understanding and agreed to proceed.      I discussed the assessment and treatment plan with the patient. The patient was provided an opportunity to ask questions and all were answered. The patient agreed with the plan and demonstrated an understanding of the instructions.   The patient was advised to call back or seek an in-person evaluation if the symptoms worsen or if the condition fails to improve as anticipated.  I provided 15 minutes of non-face-to-face time during this encounter.   [name], MD  [facility] MD/PA/NP OP Progress Note  09/14/2021 10:16 AM Rebecca [name]  MRN:  [id number]  Chief Complaint:  Chief Complaint  Patient presents with   Depression   Anxiety   Follow-up   HPI: This patient is a 71 year old white female who lives with her husband and mother-in-law in [place].  She is a retired [employer] manager  The patient returns for follow-up after 3 months regarding her anxiety and depression.  Most of the time she is doing well.  She did have a renal CT which showed multiple cysts.  She denies any other health issues right now.  She is spending most of her time caring for her mother-in-law who has congestive heart failure.  She denies significant depression anxiety thoughts of self-harm or suicide.  She feels that her mood is stable.  She is sleeping well and her energy is good Visit Diagnosis:    ICD-10-CM   1. Major depressive disorder, recurrent  episode, moderate (HCC)  F33.1     2. ANXIETY DEPRESSION  F34.1       Past Psychiatric History: 2 past psychiatric hospitalizations for depression  Past Medical History:  Past Medical History:  Diagnosis Date   Allergy    Amenia    Anxiety    Arthritis    Chronic back pain    Chronic kidney disease    cyst on bil kidneys   Colitis, ischemic (HCC)    Colon polyp    Complication of anesthesia    COPD (chronic obstructive pulmonary disease) (HCC)    Coronary artery disease    Depression    Dysphagia    Fibromyalgia    GERD (gastroesophageal reflux disease)    Hiatal hernia    Hyperlipidemia    Hypertension    IBS (irritable bowel syndrome)    Myocardial infarction ([place])    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 Colitis   Tinnitus    UTI (urinary tract infection)     Past Surgical History:  Procedure Laterality Date   ABDOMINAL HYSTERECTOMY     BACK SURGERY     BLADDER SURGERY     BREAST SURGERY     carple tunnal  surgery     Right hand  CHOLECYSTECTOMY N/A 03/26/2018   Procedure: LAPAROSCOPIC CHOLECYSTECTOMY WITH ATTEMPTED INTRAOPERATIVE CHOLANGIOGRAM;  Surgeon: Donnie Mesa, MD;  Location: [place];  Service: General;  Laterality: N/A;   COLONOSCOPY  09/10/2005   in [place]     cyst on neck   pillonidal cyst     resection   TUBAL LIGATION     UPPER GI ENDOSCOPY  05/05/2011    Family Psychiatric History: See below  Family History:  Family History  Problem Relation Age of Onset   Alcohol abuse Father    Depression Father    Cancer Father        lung   Cancer Paternal Grandfather        lung   Osteoporosis Maternal Grandmother    Kyphosis Maternal Grandmother    Stroke Maternal Grandfather    Osteoporosis Mother     Kyphosis Mother    Stroke Mother    Heart disease Mother    Kidney disease Mother    Sleep apnea Mother        said used oxygen at night    Arthritis Brother    Arthritis Brother    Scoliosis Daughter    Scoliosis Son    Colon cancer Maternal Uncle    Diabetes Maternal Uncle    Irritable bowel syndrome Maternal Uncle    Esophageal cancer Neg Hx    Stomach cancer Neg Hx     Social History:  Social History   Socioeconomic History   Marital status: Married    Spouse name: Not on file   Number of children: 2   Years of education: Not on file   Highest education level: Not on file  Occupational History   Occupation: CNA  Tobacco Use   Smoking status: Every Day    Packs/day: 1.00    Years: 44.00    Pack years: 44.00    Types: Cigarettes   Smokeless tobacco: Never   Tobacco comments:    smokes 5 cig daily  Vaping Use   Vaping Use: Never used  Substance and Sexual Activity   Alcohol use: No    Alcohol/week: 0.0 standard drinks   Drug use: No   Sexual activity: Yes    Birth control/protection: Surgical    Comment: hyst  Other Topics Concern   Not on file  Social History Narrative   Not on file   Social Determinants of Health   Financial Resource Strain: Not on file  Food Insecurity: Not on file  Transportation Needs: Not on file  Physical Activity: Not on file  Stress: Not on file  Social Connections: Not on file    Allergies:  Allergies  Allergen Reactions   Bupropion Hcl Swelling   Doxycycline Other (See Comments)    When  she is on Valium    Simvastatin Other (See Comments)    Sleepy and weak   Toradol [Ketorolac Tromethamine] Other (See Comments)    Causes gi bleeding     Cephalexin Other (See Comments)    Burning with urination   Darvocet [Propoxyphene N-Acetaminophen] Anxiety   Penicillins Swelling and Rash    Has patient had a PCN reaction causing immediate rash, facial/tongue/throat swelling, SOB or lightheadedness with hypotension: No Has  patient had a PCN reaction causing severe rash involving mucus membranes or skin necrosis: No Has patient had a PCN reaction that required hospitalization: No Has patient had a PCN reaction occurring within the last  10 years: No If all of the above answers are "NO", then may proceed with Cephalosporin use.     Metabolic Disorder Labs: No results found for: HGBA1C, MPG No results found for: PROLACTIN Lab Results  Component Value Date   CHOL 235 (H) 08/28/2008   TRIG 147 08/28/2008   HDL 52 08/28/2008   CHOLHDL 4.5 Ratio 08/28/2008   VLDL 29 08/28/2008   LDLCALC 154 (H) 08/28/2008   Lab Results  Component Value Date   TSH 1.511 08/28/2008   TSH 0.802 08/16/2007    Therapeutic Level Labs: No results found for: LITHIUM No results found for: VALPROATE No components found for:  CBMZ  Current Medications: Current Outpatient Medications  Medication Sig Dispense Refill   acetaminophen (TYLENOL) 500 MG tablet Take 500 mg by mouth every 6 (six) hours as needed for moderate pain.     albuterol (PROVENTIL) (2.5 MG/3ML) 0.083% nebulizer solution Take 2.5 mg by nebulization every 6 (six) hours as needed for wheezing or shortness of breath.     ALPRAZolam (XANAX) 0.5 MG tablet Take 1 tablet (0.5 mg total) by mouth 4 (four) times daily. 120 tablet 2   amLODipine (NORVASC) 2.5 MG tablet Take by mouth.     conjugated estrogens (PREMARIN) vaginal cream Place 1 Applicatorful vaginally daily.      Cyanocobalamin (VITAMIN B-12 IJ) Inject 1,000 mcg as directed every 30 (thirty) days.      diclofenac Sodium (VOLTAREN) 1 % GEL PLACE ONTO THE SKIN 4 (FOUR) TIMES A DAY AS NEEDED. APPLY 4G TO SITE AS DIRECTED     fluticasone (FLONASE) 50 MCG/ACT nasal spray Place 1 spray into both nostrils daily.      montelukast (SINGULAIR) 10 MG tablet Take 10 mg by mouth daily as needed (allergies).      nicotine (NICODERM CQ - DOSED IN MG/24 HR) 7 mg/24hr patch Place 7 mg onto the skin daily.     ranitidine  (ZANTAC) 150 MG tablet Take 150 mg by mouth at bedtime.  5   sertraline (ZOLOFT) 50 MG tablet Take 1 tablet (50 mg total) by mouth 2 (two) times daily. 180 tablet 2   sodium chloride (OCEAN) 0.65 % SOLN nasal spray Place 1 spray into both nostrils as needed for congestion.     No current facility-administered medications for this visit.     Musculoskeletal: Strength & Muscle Tone: na Gait & Station: na Patient leans: N/A  Psychiatric Specialty Exam: Review of Systems  All other systems reviewed and are negative.  There were no vitals taken for this visit.There is no height or weight on file to calculate BMI.  General Appearance: NA  Eye Contact:  NA  Speech:  Clear and Coherent  Volume:  Normal  Mood:  Euthymic  Affect:  NA  Thought Process:  Goal Directed  Orientation:  Full (Time, Place, and Person)  Thought Content: WDL   Suicidal Thoughts:  No  Homicidal Thoughts:  No  Memory:  Immediate;   Good Recent;   Good Remote;   Fair  Judgement:  Good  Insight:  Fair  Psychomotor Activity:  Normal  Concentration:  Concentration: Good and Attention Span: Good  Recall:  Good  Fund of Knowledge: Good  Language: Good  Akathisia:  No  Handed:  Right  AIMS (if indicated): not done  Assets:  Communication Skills Desire for Improvement Resilience Social Support Talents/Skills  ADL's:  Intact  Cognition: WNL  Sleep:  Good   Screenings: PHQ2-9    Flowsheet  Row Video Visit from 09/14/2021 in [place] Video Visit from 05/18/2021 in [place] Video Visit from 01/05/2021 in [place] Video Visit from 08/24/2020 in [place] Office Visit from 04/25/2017 in [facility]  PHQ-2 Total Score 0 0 0 0 0      Flowsheet Row Video Visit from 09/14/2021 in [place]  Video Visit from 05/18/2021 in [place] Video Visit from 08/24/2020 in [place] No Risk No Risk No Risk        Assessment and Plan: This patient is a 71 year old female with a history of depression and anxiety.  She continues to do well on her current regimen.  She will continue Zoloft 50 mg twice daily for depression and anxiety and Xanax 0.5 mg 4 times daily for anxiety as needed.  She will return to see me in 3 months Collaboration of Care: Collaboration of Care: Primary Care Provider AEB notes are shared with PCP on the epic system  Patient/Guardian was advised Release of Information must be obtained prior to any record release in order to collaborate their care with an outside provider. Patient/Guardian was advised if they have not already done so to contact the registration department to sign all necessary forms in order for [place] to release information regarding their care.   Consent: Patient/Guardian gives verbal consent for treatment and assignment of benefits for services provided during this visit. Patient/Guardian expressed understanding and agreed to proceed.    [name], MD 09/14/2021, 10:16 AM

## 2021-11-11 ENCOUNTER — Emergency Department (HOSPITAL_COMMUNITY): Payer: Medicare HMO

## 2021-11-11 ENCOUNTER — Encounter (HOSPITAL_COMMUNITY): Payer: Self-pay | Admitting: *Deleted

## 2021-11-11 ENCOUNTER — Emergency Department (HOSPITAL_COMMUNITY)
Admission: EM | Admit: 2021-11-11 | Discharge: 2021-11-11 | Disposition: A | Discharge status: Home / Self Care | Payer: Medicare HMO | Attending: Emergency Medicine | Admitting: Emergency Medicine

## 2021-11-11 DIAGNOSIS — U071 COVID-19: Secondary | ICD-10-CM | POA: Diagnosis not present

## 2021-11-11 DIAGNOSIS — R112 Nausea with vomiting, unspecified: Secondary | ICD-10-CM | POA: Diagnosis present

## 2021-11-11 HISTORY — DX: Supraventricular tachycardia, unspecified: I47.10

## 2021-11-11 HISTORY — DX: Supraventricular tachycardia: I47.1

## 2021-11-11 LAB — CBC WITH DIFFERENTIAL/PLATELET
Abs Immature Granulocytes: 0.01 10*3/uL (ref 0.00–0.07)
Basophils Absolute: 0 10*3/uL (ref 0.0–0.1)
Basophils Relative: 1 %
Eosinophils Absolute: 0 10*3/uL (ref 0.0–0.5)
Eosinophils Relative: 1 %
HCT: 44.2 % (ref 36.0–46.0)
Hemoglobin: 14.6 g/dL (ref 12.0–15.0)
Immature Granulocytes: 0 %
Lymphocytes Relative: 34 %
Lymphs Abs: 1.2 10*3/uL (ref 0.7–4.0)
MCH: 31.6 pg (ref 26.0–34.0)
MCHC: 33 g/dL (ref 30.0–36.0)
MCV: 95.7 fL (ref 80.0–100.0)
Monocytes Absolute: 0.6 10*3/uL (ref 0.1–1.0)
Monocytes Relative: 15 %
Neutro Abs: 1.8 10*3/uL (ref 1.7–7.7)
Neutrophils Relative %: 49 %
Platelets: 153 10*3/uL (ref 150–400)
RBC: 4.62 MIL/uL (ref 3.87–5.11)
RDW: 13.5 % (ref 11.5–15.5)
WBC: 3.6 10*3/uL — ABNORMAL LOW (ref 4.0–10.5)
nRBC: 0 % (ref 0.0–0.2)

## 2021-11-11 LAB — URINALYSIS, ROUTINE W REFLEX MICROSCOPIC
Bilirubin Urine: NEGATIVE
Glucose, UA: NEGATIVE mg/dL
Ketones, ur: NEGATIVE mg/dL
Leukocytes,Ua: NEGATIVE
Nitrite: NEGATIVE
Protein, ur: NEGATIVE mg/dL
Specific Gravity, Urine: 1.003 — ABNORMAL LOW (ref 1.005–1.030)
pH: 6 (ref 5.0–8.0)

## 2021-11-11 LAB — RESP PANEL BY RT-PCR (FLU A&B, COVID) ARPGX2
Influenza A by PCR: NEGATIVE
Influenza B by PCR: NEGATIVE
SARS Coronavirus 2 by RT PCR: POSITIVE — AB

## 2021-11-11 LAB — COMPREHENSIVE METABOLIC PANEL
ALT: 45 U/L — ABNORMAL HIGH (ref 0–44)
AST: 64 U/L — ABNORMAL HIGH (ref 15–41)
Albumin: 4.5 g/dL (ref 3.5–5.0)
Alkaline Phosphatase: 65 U/L (ref 38–126)
Anion gap: 7 (ref 5–15)
BUN: 9 mg/dL (ref 8–23)
CO2: 26 mmol/L (ref 22–32)
Calcium: 9.2 mg/dL (ref 8.9–10.3)
Chloride: 103 mmol/L (ref 98–111)
Creatinine, Ser: 0.79 mg/dL (ref 0.44–1.00)
GFR, Estimated: >60 mL/min (ref >60)
Glucose, Bld: 90 mg/dL (ref 70–99)
Potassium: 3.4 mmol/L — ABNORMAL LOW (ref 3.5–5.1)
Sodium: 136 mmol/L (ref 135–145)
Total Bilirubin: 0.4 mg/dL (ref 0.3–1.2)
Total Protein: 7.7 g/dL (ref 6.5–8.1)

## 2021-11-11 MED ORDER — NIRMATRELVIR/RITONAVIR (PAXLOVID)TABLET: 3.0000 | ORAL_TABLET | Freq: Two times a day (BID) | ORAL | 0 refills | Status: AC

## 2021-11-11 MED ORDER — IOHEXOL 300 MG/ML  SOLN
100.0000 mL | Freq: Once | INTRAMUSCULAR | Status: AC | PRN
  Administered 2021-11-11: 100 mL via INTRAVENOUS

## 2021-11-11 MED ORDER — ONDANSETRON HCL 4 MG/2ML IJ SOLN
4.0000 mg | Freq: Once | INTRAMUSCULAR | Status: AC
  Administered 2021-11-11: 4 mg via INTRAVENOUS
  Filled 2021-11-11: qty 2

## 2021-11-11 MED ORDER — SODIUM CHLORIDE 0.9 % IV BOLUS
1000.0000 mL | Freq: Once | INTRAVENOUS | Status: AC
  Administered 2021-11-11: 1000 mL via INTRAVENOUS

## 2021-11-11 MED ORDER — IOHEXOL 9 MG/ML PO SOLN
ORAL | Status: AC
  Administered 2021-11-11: 500 mL
  Filled 2021-11-11: qty 1000

## 2021-11-11 NOTE — Discharge Instructions (Signed)
As discussed, you have been diagnosed with COVID.  Typically this does improve with time, rest, fluids and monitoring.  Return here for concerning changes in your condition.

## 2021-11-11 NOTE — ED Triage Notes (Signed)
Pt c/o diarrhea x 5 days, headache x 4 days (now gone), fever up to 101.2, body aches and cough that started today. Pt's grandson dx with Covid that she was around recently and also takes care of her mother-in-law who lives with her and recently diagnosed with E. Coli. Pt concerned she may have Covid or E. Coli.

## 2021-11-11 NOTE — ED Provider Notes (Signed)
[facility] EMERGENCY DEPARTMENT Provider Note   CSN: [id number] Arrival date & time: 11/11/21  0263     History  No chief complaint on file.   Rebecca [name] is a 71 y.o. female.  HPI Patient presents with her husband who has a somewhat similar illness. Patient notes a history of colitis, prior notification of renal mass, now presents with abdominal pain, fatigue, nausea, vomiting, diminished bowel movements.  She notes that she was with her husband a few days ago, around a family member who was subsequently found to have COVID, and since that time she and he have both become ill and similar, though somewhat distinct manners.  She notes weakness, fatigue, symptoms as above.  No relief with anything.  She has not seen her physician since onset.    Home Medications Prior to Admission medications   Medication Sig Start Date End Date Taking? Authorizing Provider  nirmatrelvir/ritonavir EUA (PAXLOVID) 20 x 150 MG & 10 x 100MG TABS Take 3 tablets by mouth 2 (two) times daily for 5 days. Patient GF2R is 55. Take nirmatrelvir (150 mg) two tablets twice daily for 5 days and ritonavir (100 mg) one tablet twice daily for 5 days. 11/11/21 11/16/21 Yes Carmin Muskrat, MD  acetaminophen (TYLENOL) 500 MG tablet Take 500 mg by mouth every 6 (six) hours as needed for moderate pain.    [provider]  albuterol (PROVENTIL) (2.5 MG/3ML) 0.083% nebulizer solution Take 2.5 mg by nebulization every 6 (six) hours as needed for wheezing or shortness of breath.    [provider]  ALPRAZolam Duanne Moron) 0.5 MG tablet Take 1 tablet (0.5 mg total) by mouth 4 (four) times daily. 09/14/21 09/14/22  Cloria Spring, MD  amLODipine (NORVASC) 2.5 MG tablet Take by mouth. 04/05/20   [provider]  conjugated estrogens (PREMARIN) vaginal cream Place 1 Applicatorful vaginally daily.  11/08/13   [provider]  Cyanocobalamin (VITAMIN B-12 IJ) Inject 1,000 mcg as directed every 30 (thirty)  days.     [provider]  diclofenac Sodium (VOLTAREN) 1 % GEL PLACE ONTO THE SKIN 4 (FOUR) TIMES A DAY AS NEEDED. APPLY 4G TO SITE AS DIRECTED 03/09/19   [provider]  fluticasone (FLONASE) 50 MCG/ACT nasal spray Place 1 spray into both nostrils daily.  03/06/17   [provider]  montelukast (SINGULAIR) 10 MG tablet Take 10 mg by mouth daily as needed (allergies).     [provider]  nicotine (NICODERM CQ - DOSED IN MG/24 HR) 7 mg/24hr patch Place 7 mg onto the skin daily.    [provider]  ranitidine (ZANTAC) 150 MG tablet Take 150 mg by mouth at bedtime. 01/25/18   [provider]  sertraline (ZOLOFT) 50 MG tablet Take 1 tablet (50 mg total) by mouth 2 (two) times daily. 09/14/21 09/14/22  Cloria Spring, MD  sodium chloride (OCEAN) 0.65 % SOLN nasal spray Place 1 spray into both nostrils as needed for congestion.    [provider]      Allergies    Bupropion hcl, Doxycycline, Simvastatin, Toradol [ketorolac tromethamine], Cephalexin, Darvocet [propoxyphene n-acetaminophen], and Penicillins    Review of Systems   Review of Systems  All other systems reviewed and are negative.   Physical Exam Updated Vital Signs BP (!) 146/65   Pulse 70   Temp 98.1 F (36.7 C) (Oral)   Resp 12   Ht 5' 5"  (1.651 m)   Wt 67.1 kg   SpO2 94%  BMI 24.63 kg/m  Physical Exam Vitals and nursing note reviewed.  Constitutional:      General: She is not in acute distress.    Appearance: She is well-developed.  HENT:     Head: Normocephalic and atraumatic.  Eyes:     Conjunctiva/sclera: Conjunctivae normal.  Cardiovascular:     Rate and Rhythm: Normal rate and regular rhythm.  Pulmonary:     Effort: Pulmonary effort is normal. No respiratory distress.     Breath sounds: Normal breath sounds. No stridor.  Abdominal:     General: There is no distension.     Tenderness: There is abdominal tenderness.  Skin:    General: Skin is  warm and dry.  Neurological:     Mental Status: She is alert and oriented to person, place, and time.     Cranial Nerves: No cranial nerve deficit.  Psychiatric:        Mood and Affect: Mood normal.     ED Results / Procedures / Treatments   Labs (all labs ordered are listed, but only abnormal results are displayed) Labs Reviewed  RESP PANEL BY RT-PCR (FLU A&B, COVID) ARPGX2 - Abnormal; Notable for the following components:      Result Value   SARS Coronavirus 2 by RT PCR POSITIVE (*)    All other components within normal limits  COMPREHENSIVE METABOLIC PANEL - Abnormal; Notable for the following components:   Potassium 3.4 (*)    AST 64 (*)    ALT 45 (*)    All other components within normal limits  CBC WITH DIFFERENTIAL/PLATELET - Abnormal; Notable for the following components:   WBC 3.6 (*)    All other components within normal limits  URINALYSIS, ROUTINE W REFLEX MICROSCOPIC - Abnormal; Notable for the following components:   Color, Urine STRAW (*)    Specific Gravity, Urine 1.003 (*)    Hgb urine dipstick MODERATE (*)    Bacteria, UA RARE (*)    All other components within normal limits    EKG None  Radiology CT Abdomen Pelvis W Contrast  Result Date: 11/11/2021 CLINICAL DATA:  Nausea/vomiting Abdominal pain, acute, nonlocalized EXAM: CT ABDOMEN AND PELVIS WITH CONTRAST TECHNIQUE: Multidetector CT imaging of the abdomen and pelvis was performed using the standard protocol following bolus administration of intravenous contrast. RADIATION DOSE REDUCTION: This exam was performed according to the departmental dose-optimization program which includes automated exposure control, adjustment of the mA and/or kV according to patient size and/or use of iterative reconstruction technique. CONTRAST:  132m OMNIPAQUE IOHEXOL 300 MG/ML  SOLN COMPARISON:  2019 FINDINGS: Lower chest: No acute abnormality.  Hiatal hernia. Hepatobiliary: No focal liver abnormality is seen. Status post  cholecystectomy. No biliary dilatation. Pancreas: Unremarkable. Spleen: Unremarkable. Adrenals/Urinary Tract: Unremarkable. Bilateral renal cysts and too small to characterize low-density lesions. Bladder is unremarkable. Stomach/Bowel: Stomach is within normal limits. Bowel is normal in caliber. Normal appendix. Vascular/Lymphatic: Diffuse atherosclerosis.  No enlarged nodes. Reproductive: Status post hysterectomy. No adnexal masses. Other: No free fluid.  Abdominal wall is unremarkable. Musculoskeletal: Degenerative changes of the lumbar spine. IMPRESSION: No acute abnormality. Electronically Signed   By: PMacy MisM.D.   On: 11/11/2021 13:12    Procedures Procedures    Medications Ordered in ED Medications  sodium chloride 0.9 % bolus 1,000 mL (0 mLs Intravenous Stopped 11/11/21 1309)  ondansetron (ZOFRAN) injection 4 mg (4 mg Intravenous Given 11/11/21 1031)  iohexol (OMNIPAQUE) 9 MG/ML oral solution (500 mLs  Contrast Given 11/11/21 1010)  iohexol (OMNIPAQUE) 300 MG/ML solution 100 mL (100 mLs Intravenous Contrast Given 11/11/21 1231)    ED Course/ Medical Decision Making/ A&P This patient with a Hx of multiple medical issues including hypertension, renal mass, colitis presents to the ED for concern of domino pain, nausea, vomiting, weakness, this involves an extensive number of treatment options, and is a complaint that carries with it a high risk of complications and morbidity.    The differential diagnosis includes colitis, progression of disease with prior mass, COVID   Social Determinants of Health:  No limiting factors aside from mild hearing loss  Additional history obtained:  Additional history and/or information obtained from husband and family member at bedside, notable for included in HPI   After the initial evaluation, orders, including: Labs fluids CT were initiated.   Patient placed on Cardiac and Pulse-Oximetry Monitors. The patient was maintained on a cardiac  monitor.  The cardiac monitored showed an rhythm of 70 sinus normal The patient was also maintained on pulse oximetry. The readings were typically 100% room air normal   On repeat evaluation of the patient improved  Lab Tests:  I personally interpreted labs.  The pertinent results include: COVID-positive  Imaging Studies ordered:  I independently visualized and interpreted imaging which showed unremarkable abdominal pelvis CT I agree with the radiologist interpretation   Dispostion / Final MDM:  After consideration of the diagnostic results and the patient's response to treatment, patient is awake, alert, hemodynamically unremarkable, no oxygen requirement.  Blood pressure is stable.  Patient is aware of all findings.  Both she and her husband have tested positive for COVID.  She is 3 days out from symptom onset, may benefit from Paxlovid to which she is amenable starting.  No evidence for bacteremia, sepsis or other confounding factors.  Patient discharged in stable condition.  Final Clinical Impression(s) / ED Diagnoses Final diagnoses:  COVID    Rx / DC Orders ED Discharge Orders          Ordered    nirmatrelvir/ritonavir EUA (PAXLOVID) 20 x 150 MG & 10 x 100MG TABS  2 times daily        11/11/21 1338              Carmin Muskrat, MD 11/11/21 1357

## 2021-11-15 ENCOUNTER — Emergency Department (HOSPITAL_COMMUNITY): Payer: Medicare HMO

## 2021-11-15 ENCOUNTER — Encounter (HOSPITAL_COMMUNITY): Payer: Self-pay | Admitting: Emergency Medicine

## 2021-11-15 ENCOUNTER — Emergency Department (HOSPITAL_COMMUNITY)
Admission: EM | Admit: 2021-11-15 | Discharge: 2021-11-15 | Disposition: A | Discharge status: Home / Self Care | Payer: Medicare HMO | Attending: Emergency Medicine | Admitting: Emergency Medicine

## 2021-11-15 ENCOUNTER — Other Ambulatory Visit: Payer: Self-pay

## 2021-11-15 DIAGNOSIS — R197 Diarrhea, unspecified: Secondary | ICD-10-CM | POA: Diagnosis present

## 2021-11-15 DIAGNOSIS — R112 Nausea with vomiting, unspecified: Secondary | ICD-10-CM | POA: Insufficient documentation

## 2021-11-15 DIAGNOSIS — Z8616 Personal history of COVID-19: Secondary | ICD-10-CM | POA: Diagnosis not present

## 2021-11-15 DIAGNOSIS — R109 Unspecified abdominal pain: Secondary | ICD-10-CM | POA: Diagnosis not present

## 2021-11-15 DIAGNOSIS — K6289 Other specified diseases of anus and rectum: Secondary | ICD-10-CM | POA: Insufficient documentation

## 2021-11-15 DIAGNOSIS — I2510 Atherosclerotic heart disease of native coronary artery without angina pectoris: Secondary | ICD-10-CM | POA: Insufficient documentation

## 2021-11-15 DIAGNOSIS — R110 Nausea: Secondary | ICD-10-CM

## 2021-11-15 DIAGNOSIS — J449 Chronic obstructive pulmonary disease, unspecified: Secondary | ICD-10-CM | POA: Insufficient documentation

## 2021-11-15 LAB — CBC WITH DIFFERENTIAL/PLATELET
Abs Immature Granulocytes: 0.03 10*3/uL (ref 0.00–0.07)
Basophils Absolute: 0 10*3/uL (ref 0.0–0.1)
Basophils Relative: 0 %
Eosinophils Absolute: 0 10*3/uL (ref 0.0–0.5)
Eosinophils Relative: 1 %
HCT: 40.5 % (ref 36.0–46.0)
Hemoglobin: 13.4 g/dL (ref 12.0–15.0)
Immature Granulocytes: 1 %
Lymphocytes Relative: 16 %
Lymphs Abs: 0.8 10*3/uL (ref 0.7–4.0)
MCH: 31.5 pg (ref 26.0–34.0)
MCHC: 33.1 g/dL (ref 30.0–36.0)
MCV: 95.3 fL (ref 80.0–100.0)
Monocytes Absolute: 0.5 10*3/uL (ref 0.1–1.0)
Monocytes Relative: 9 %
Neutro Abs: 3.9 10*3/uL (ref 1.7–7.7)
Neutrophils Relative %: 73 %
Platelets: 132 10*3/uL — ABNORMAL LOW (ref 150–400)
RBC: 4.25 MIL/uL (ref 3.87–5.11)
RDW: 13.4 % (ref 11.5–15.5)
WBC: 5.3 10*3/uL (ref 4.0–10.5)
nRBC: 0 % (ref 0.0–0.2)

## 2021-11-15 LAB — URINALYSIS, ROUTINE W REFLEX MICROSCOPIC
Bilirubin Urine: NEGATIVE
Glucose, UA: NEGATIVE mg/dL
Ketones, ur: NEGATIVE mg/dL
Leukocytes,Ua: NEGATIVE
Nitrite: NEGATIVE
Protein, ur: NEGATIVE mg/dL
Specific Gravity, Urine: 1.006 (ref 1.005–1.030)
pH: 6 (ref 5.0–8.0)

## 2021-11-15 LAB — MAGNESIUM: Magnesium: 1.7 mg/dL (ref 1.7–2.4)

## 2021-11-15 LAB — COMPREHENSIVE METABOLIC PANEL
ALT: 36 U/L (ref 0–44)
AST: 41 U/L (ref 15–41)
Albumin: 4.2 g/dL (ref 3.5–5.0)
Alkaline Phosphatase: 56 U/L (ref 38–126)
Anion gap: 7 (ref 5–15)
BUN: 8 mg/dL (ref 8–23)
CO2: 26 mmol/L (ref 22–32)
Calcium: 8.8 mg/dL — ABNORMAL LOW (ref 8.9–10.3)
Chloride: 107 mmol/L (ref 98–111)
Creatinine, Ser: 0.75 mg/dL (ref 0.44–1.00)
GFR, Estimated: >60 mL/min (ref >60)
Glucose, Bld: 100 mg/dL — ABNORMAL HIGH (ref 70–99)
Potassium: 3.7 mmol/L (ref 3.5–5.1)
Sodium: 140 mmol/L (ref 135–145)
Total Bilirubin: 0.6 mg/dL (ref 0.3–1.2)
Total Protein: 7.1 g/dL (ref 6.5–8.1)

## 2021-11-15 LAB — LIPASE, BLOOD: Lipase: 25 U/L (ref 11–51)

## 2021-11-15 MED ORDER — ZINC OXIDE 40 % EX OINT
TOPICAL_OINTMENT | Freq: Once | CUTANEOUS | Status: DC
  Filled 2021-11-15: qty 57

## 2021-11-15 MED ORDER — METOCLOPRAMIDE HCL 5 MG PO TABS: 5.0000 mg | ORAL_TABLET | Freq: Three times a day (TID) | ORAL | 0 refills | Status: AC | PRN

## 2021-11-15 MED ORDER — LACTATED RINGERS IV BOLUS
1000.0000 mL | Freq: Once | INTRAVENOUS | Status: AC
  Administered 2021-11-15: 1000 mL via INTRAVENOUS

## 2021-11-15 MED ORDER — MORPHINE SULFATE (PF) 4 MG/ML IV SOLN
2.0000 mg | Freq: Once | INTRAVENOUS | Status: AC
  Administered 2021-11-15: 2 mg via INTRAVENOUS
  Filled 2021-11-15: qty 1

## 2021-11-15 MED ORDER — METOCLOPRAMIDE HCL 5 MG/ML IJ SOLN
5.0000 mg | Freq: Once | INTRAMUSCULAR | Status: AC
  Administered 2021-11-15: 5 mg via INTRAVENOUS
  Filled 2021-11-15: qty 2

## 2021-11-15 NOTE — ED Provider Notes (Signed)
[facility] Provider Note   CSN: [id number] Arrival date & time: 11/15/21  9735     History {Add pertinent medical, surgical, social history, OB history to HPI:1} Chief Complaint  Patient presents with   Diarrhea    Rebecca [name] is a 71 y.o. female.   Diarrhea Associated symptoms: vomiting   Patient presents for diarrhea.  Medical history includes HLD, anxiety, depression, ischemic colitis, anemia, chronic pain, COPD, CAD, fibromyalgia, GERD, IBS.  She was seen recently in the ED 4 days ago.  At that time, she endorsed nausea, vomiting and diminished bowel movements.  She tested positive for COVID-19.  She was started on Paxlovid.  Since her COVID-19 diagnosis, she has developed diarrhea.  She describes this as watery and very frequent.  She has greater than 20 bowel movements per day.  She went to urgent care yesterday for her symptoms of diarrhea and associated rectal pain.  She was prescribed Lomotil, Anusol suppository, and nystatin.  Today, she reports continued symptoms of diarrhea and irritated skin in the area of her gluteal cleft.  She has had continued nausea which is controlled if she is not up and moving around.  She lives at home with her husband and son.     Home Medications Prior to Admission medications   Medication Sig Start Date End Date Taking? Authorizing Provider  acetaminophen (TYLENOL) 500 MG tablet Take 500 mg by mouth every 6 (six) hours as needed for moderate pain.    [provider]  albuterol (PROVENTIL) (2.5 MG/3ML) 0.083% nebulizer solution Take 2.5 mg by nebulization every 6 (six) hours as needed for wheezing or shortness of breath.    [provider]  ALPRAZolam Duanne Moron) 0.5 MG tablet Take 1 tablet (0.5 mg total) by mouth 4 (four) times daily. 09/14/21 09/14/22  Cloria Spring, MD  amLODipine (NORVASC) 2.5 MG tablet Take by mouth. 04/05/20   [provider]  conjugated estrogens (PREMARIN) vaginal cream  Place 1 Applicatorful vaginally daily.  11/08/13   [provider]  Cyanocobalamin (VITAMIN B-12 IJ) Inject 1,000 mcg as directed every 30 (thirty) days.     [provider]  diclofenac Sodium (VOLTAREN) 1 % GEL PLACE ONTO THE SKIN 4 (FOUR) TIMES A DAY AS NEEDED. APPLY 4G TO SITE AS DIRECTED 03/09/19   [provider]  fluticasone (FLONASE) 50 MCG/ACT nasal spray Place 1 spray into both nostrils daily.  03/06/17   [provider]  montelukast (SINGULAIR) 10 MG tablet Take 10 mg by mouth daily as needed (allergies).     [provider]  nicotine (NICODERM CQ - DOSED IN MG/24 HR) 7 mg/24hr patch Place 7 mg onto the skin daily.    [provider]  nirmatrelvir/ritonavir EUA (PAXLOVID) 20 x 150 MG & 10 x 100MG TABS Take 3 tablets by mouth 2 (two) times daily for 5 days. Patient GF2R is 44. Take nirmatrelvir (150 mg) two tablets twice daily for 5 days and ritonavir (100 mg) one tablet twice daily for 5 days. 11/11/21 11/16/21  Carmin Muskrat, MD  ranitidine (ZANTAC) 150 MG tablet Take 150 mg by mouth at bedtime. 01/25/18   [provider]  sertraline (ZOLOFT) 50 MG tablet Take 1 tablet (50 mg total) by mouth 2 (two) times daily. 09/14/21 09/14/22  Cloria Spring, MD  sodium chloride (OCEAN) 0.65 % SOLN nasal spray Place 1 spray into both nostrils as needed for congestion.    [provider]  Allergies    Bupropion hcl, Doxycycline, Simvastatin, Toradol [ketorolac tromethamine], Cephalexin, Darvocet [propoxyphene n-acetaminophen], and Penicillins    Review of Systems   Review of Systems  Constitutional:  Positive for activity change, appetite change and fatigue.  Gastrointestinal:  Positive for diarrhea, nausea, rectal pain and vomiting.  Skin:  Positive for rash.    Physical Exam Updated Vital Signs BP (!) 153/72   Pulse 64   Temp 97.7 F (36.5 C) (Oral)   Resp 18   Ht 5' 5"  (1.651 m)   Wt 67.1 kg   SpO2 94%   BMI 24.62  kg/m  Physical Exam Vitals and nursing note reviewed. Exam conducted with a chaperone present.  Constitutional:      General: She is not in acute distress.    Appearance: Normal appearance. She is well-developed and normal weight. She is not ill-appearing, toxic-appearing or diaphoretic.  HENT:     Head: Normocephalic and atraumatic.     Right Ear: External ear normal.     Left Ear: External ear normal.     Nose: Nose normal.  Eyes:     Extraocular Movements: Extraocular movements intact.     Conjunctiva/sclera: Conjunctivae normal.  Cardiovascular:     Rate and Rhythm: Normal rate and regular rhythm.     Heart sounds: No murmur heard. Pulmonary:     Effort: Pulmonary effort is normal. No respiratory distress.  Abdominal:     General: There is no distension.     Palpations: Abdomen is soft.     Tenderness: There is abdominal tenderness.  Genitourinary:    Comments: Mild erythematous skin within gluteal cleft Musculoskeletal:        General: No swelling. Normal range of motion.     Cervical back: Normal range of motion and neck supple. No rigidity.     Right lower leg: No edema.     Left lower leg: No edema.  Skin:    General: Skin is warm and dry.     Capillary Refill: Capillary refill takes less than 2 seconds.     Coloration: Skin is not jaundiced or pale.  Neurological:     General: No focal deficit present.     Mental Status: She is alert and oriented to person, place, and time.     ED Results / Procedures / Treatments   Labs (all labs ordered are listed, but only abnormal results are displayed) Labs Reviewed - No data to display  EKG None  Radiology No results found.  Procedures Procedures  {Document cardiac monitor, telemetry assessment procedure when appropriate:1}  Medications Ordered in ED Medications - No data to display  ED Course/ Medical Decision Making/ A&P                           Medical Decision Making Amount and/or Complexity of Data  Reviewed Labs: ordered. Radiology: ordered.  Risk OTC drugs. Prescription drug management.   This patient presents to the ED for concern of diarrhea, this involves an extensive number of treatment options, and is a complaint that carries with it a high risk of complications and morbidity.  The differential diagnosis includes dehydration, electrolyte disturbances, Clostridium difficile, diverticulitis, colitis, ischemic bowel, enteritis, symptoms of COVID-19   Co morbidities that complicate the patient evaluation  HLD, anxiety, depression, ischemic colitis, anemia, chronic pain, COPD, CAD, fibromyalgia, GERD, IBS   Additional history obtained:  Additional history obtained from N/A External records from outside source obtained  and reviewed including EMR   Lab Tests:  I Ordered, and personally interpreted labs.  The pertinent results include:  ***   Imaging Studies ordered:  I ordered imaging studies including ***  I independently visualized and interpreted imaging which showed *** I agree with the radiologist interpretation   Cardiac Monitoring: / EKG:  The patient was maintained on a cardiac monitor.  I personally viewed and interpreted the cardiac monitored which showed an underlying rhythm of: ***   Consultations Obtained:  I requested consultation with the ***,  and discussed lab and imaging findings as well as pertinent plan - they recommend: ***   Problem List / ED Course / Critical interventions / Medication management  Patient presents from home for persistent symptoms of diarrhea and perirectal irritation.  She reports that her diarrhea has been ongoing since her recent diagnosis of COVID-19.  She went to urgent care yesterday for similar symptoms and was prescribed Lomotil, and is also Vanstory, and nystatin ointment.  She presents to the ED today for persistent symptoms.  On arrival, patient is afebrile with normal heart rate and moderately elevated BP.  On  exam, she does appear fatigued.  Her breathing is unlabored.  She endorses current abdominal pain and there is some tenderness present.  She did undergo CT scan 4 days ago, however, she does have a history of ischemic bowel and states that her current symptoms feel reminiscent of that time.  She reports a watery bowel movement every time she tries to eat or drink anything.  I suspect that she is dehydrated due to fluid losses and poor p.o. intake.  Bolus of IV fluids was ordered.  Patient was given morphine and Reglan for symptomatic relief of pain and nausea.  This is an ointment was ordered for area of contact dermatitis in gluteal cleft.  Laboratory workup was initiated.  Initial results show normal electrolytes.  CT scan shows***. I ordered medication including ***  for ***  Reevaluation of the patient after these medicines showed that the patient {resolved/improved/worsened:23923::"improved"} I have reviewed the patients home medicines and have made adjustments as needed   Social Determinants of Health:  ***   Test / Admission - Considered:  ***   {Document critical care time when appropriate:1} {Document review of labs and clinical decision tools ie heart score, Chads2Vasc2 etc:1}  {Document your independent review of radiology images, and any outside records:1} {Document your discussion with family members, caretakers, and with consultants:1} {Document social determinants of health affecting pt's care:1} {Document your decision making why or why not admission, treatments were needed:1} Final Clinical Impression(s) / ED Diagnoses Final diagnoses:  None    Rx / DC Orders ED Discharge Orders     None

## 2021-11-15 NOTE — ED Triage Notes (Signed)
Pt c/o diarrhea for a long time. Pt c/o rectal burning that started this am.

## 2021-11-15 NOTE — Discharge Instructions (Signed)
Take nausea medicine at home as needed in order to stay hydrated.  A prescription for Reglan was sent to your pharmacy.  If you continue to have diarrhea, you will need to drink plenty of fluids to replace your fluid losses.  Utilize diaper cream on areas of raw skin on your buttocks.  Continue to use this until raw skin has healed.  Return to the emergency department for any new or worsening symptoms of concern.

## 2022-01-11 ENCOUNTER — Encounter (HOSPITAL_COMMUNITY): Payer: Self-pay | Admitting: Psychiatry

## 2022-01-11 ENCOUNTER — Telehealth (INDEPENDENT_AMBULATORY_CARE_PROVIDER_SITE_OTHER): Payer: Medicare HMO | Admitting: Psychiatry

## 2022-01-11 DIAGNOSIS — F331 Major depressive disorder, recurrent, moderate: Secondary | ICD-10-CM

## 2022-01-11 MED ORDER — ALPRAZOLAM 0.5 MG PO TABS
0.5000 mg | ORAL_TABLET | Freq: Four times a day (QID) | ORAL | 2 refills | Status: DC
Start: 2022-01-11 — End: 2022-05-03

## 2022-01-11 MED ORDER — SERTRALINE HCL 50 MG PO TABS: 50.0000 mg | ORAL_TABLET | Freq: Two times a day (BID) | ORAL | 2 refills | Status: DC

## 2022-01-11 NOTE — Progress Notes (Signed)
Virtual Visit via Video Note  I connected with Rebecca [name] on 01/11/22 at  2:00 PM EDT by a video enabled telemedicine application and verified that I am speaking with the correct person using two identifiers.  Location: Patient: home Provider: office   I discussed the limitations of evaluation and management by telemedicine and the availability of in person appointments. The patient expressed understanding and agreed to proceed.    I discussed the assessment and treatment plan with the patient. The patient was provided an opportunity to ask questions and all were answered. The patient agreed with the plan and demonstrated an understanding of the instructions.   The patient was advised to call back or seek an in-person evaluation if the symptoms worsen or if the condition fails to improve as anticipated.  I provided 15 minutes of non-face-to-face time during this encounter.   [name], MD  [facility] MD/PA/NP OP Progress Note  01/11/2022 2:15 PM Rebecca [name]  MRN:  [id number]  Chief Complaint:  Chief Complaint  Patient presents with   Depression   Anxiety   Follow-up   HPI: This patient is a 71 year old white female who lives with her husband in [place].  She is a retired [employer] manager.  The patient returns for follow-up after 3 months regarding her anxiety and depression.  She states that she and her husband contracted COVID in mid August and unfortunately her mother-in-law got it as well but did not recover.  She passed away in the hospital.  She states that her husband took it pretty hard but that now they are doing fairly well.  She lived to age 88 and they felt like they gave her a good quality of life and her last several years.  The patient herself is dealing with low back pain.  Other than that her health has been pretty good.  She denies significant depression anxiety thoughts of self-harm or suicide.  She is sleeping well. Visit Diagnosis:    ICD-10-CM   1. Major depressive  disorder, recurrent episode, moderate (HCC)  F33.1       Past Psychiatric History: 2 past psychiatric hospitalizations for depression  Past Medical History:  Past Medical History:  Diagnosis Date   Allergy    Amenia    Anxiety    Arthritis    Chronic back pain    Chronic kidney disease    cyst on bil kidneys   Colitis, ischemic (HCC)    Colon polyp    Complication of anesthesia    COPD (chronic obstructive pulmonary disease) (HCC)    Coronary artery disease    Depression    Dysphagia    Fibromyalgia    GERD (gastroesophageal reflux disease)    Hiatal hernia    Hyperlipidemia    Hypertension    IBS (irritable bowel syndrome)    Myocardial infarction ([place])    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 Colitis   SVT (supraventricular tachycardia)    Tinnitus    UTI (urinary tract infection)     Past Surgical History:  Procedure Laterality Date   ABDOMINAL HYSTERECTOMY     BACK SURGERY     BLADDER SURGERY     BREAST SURGERY     carple tunnal  surgery  Right hand   CHOLECYSTECTOMY N/A 03/26/2018   Procedure: LAPAROSCOPIC CHOLECYSTECTOMY WITH ATTEMPTED INTRAOPERATIVE CHOLANGIOGRAM;  Surgeon: Donnie Mesa, MD;  Location: [facility];  Service: General;  Laterality: N/A;   COLONOSCOPY  09/10/2005   in [place]     cyst on neck   pillonidal cyst     resection   TUBAL LIGATION     UPPER GI ENDOSCOPY  05/05/2011    Family Psychiatric History: See below  Family History:  Family History  Problem Relation Age of Onset   Alcohol abuse Father    Depression Father    Cancer Father        lung   Cancer Paternal Grandfather        lung   Osteoporosis Maternal Grandmother    Kyphosis Maternal Grandmother    Stroke Maternal Grandfather     Osteoporosis Mother    Kyphosis Mother    Stroke Mother    Heart disease Mother    Kidney disease Mother    Sleep apnea Mother        said used oxygen at night    Arthritis Brother    Arthritis Brother    Scoliosis Daughter    Scoliosis Son    Colon cancer Maternal Uncle    Diabetes Maternal Uncle    Irritable bowel syndrome Maternal Uncle    Esophageal cancer Neg Hx    Stomach cancer Neg Hx     Social History:  Social History   Socioeconomic History   Marital status: Married    Spouse name: Not on file   Number of children: 2   Years of education: Not on file   Highest education level: Not on file  Occupational History   Occupation: CNA  Tobacco Use   Smoking status: Every Day    Packs/day: 0.25    Years: 44.00    Total pack years: 11.00    Types: Cigarettes   Smokeless tobacco: Never   Tobacco comments:    smokes 4-6 cig daily  Vaping Use   Vaping Use: Never used  Substance and Sexual Activity   Alcohol use: No    Alcohol/week: 0.0 standard drinks of alcohol   Drug use: No   Sexual activity: Yes    Birth control/protection: Surgical    Comment: hyst  Other Topics Concern   Not on file  Social History Narrative   Not on file   Social Determinants of Health   Financial Resource Strain: Not on file  Food Insecurity: Not on file  Transportation Needs: Not on file  Physical Activity: Not on file  Stress: Not on file  Social Connections: Not on file    Allergies:  Allergies  Allergen Reactions   Bupropion Hcl Swelling   Doxycycline Other (See Comments)    When  she is on Valium    Simvastatin Other (See Comments)    Sleepy and weak   Toradol [Ketorolac Tromethamine] Other (See Comments)    Causes gi bleeding     Cephalexin Other (See Comments)    Burning with urination   Darvocet [Propoxyphene N-Acetaminophen] Anxiety   Penicillins Swelling and Rash    Has patient had a PCN reaction causing immediate rash, facial/tongue/throat swelling, SOB or  lightheadedness with hypotension: No Has patient had a PCN reaction causing severe rash involving mucus membranes or skin necrosis: No Has patient had a PCN reaction that required hospitalization: No Has patient had  a PCN reaction occurring within the last 10 years: No If all of the above answers are "NO", then may proceed with Cephalosporin use.     Metabolic Disorder Labs: No results found for: "HGBA1C", "MPG" No results found for: "PROLACTIN" Lab Results  Component Value Date   CHOL 235 (H) 08/28/2008   TRIG 147 08/28/2008   HDL 52 08/28/2008   CHOLHDL 4.5 Ratio 08/28/2008   VLDL 29 08/28/2008   LDLCALC 154 (H) 08/28/2008   Lab Results  Component Value Date   TSH 1.511 08/28/2008   TSH 0.802 08/16/2007    Therapeutic Level Labs: No results found for: "LITHIUM" No results found for: "VALPROATE" No results found for: "CBMZ"  Current Medications: Current Outpatient Medications  Medication Sig Dispense Refill   acetaminophen (TYLENOL) 500 MG tablet Take 500 mg by mouth every 6 (six) hours as needed for moderate pain.     albuterol (PROVENTIL) (2.5 MG/3ML) 0.083% nebulizer solution Take 2.5 mg by nebulization every 6 (six) hours as needed for wheezing or shortness of breath.     ALPRAZolam (XANAX) 0.5 MG tablet Take 1 tablet (0.5 mg total) by mouth 4 (four) times daily. 120 tablet 2   amLODipine (NORVASC) 2.5 MG tablet Take by mouth.     conjugated estrogens (PREMARIN) vaginal cream Place 1 Applicatorful vaginally daily.      Cyanocobalamin (VITAMIN B-12 IJ) Inject 1,000 mcg as directed every 30 (thirty) days.      diclofenac Sodium (VOLTAREN) 1 % GEL PLACE ONTO THE SKIN 4 (FOUR) TIMES A DAY AS NEEDED. APPLY 4G TO SITE AS DIRECTED     fluticasone (FLONASE) 50 MCG/ACT nasal spray Place 1 spray into both nostrils daily.      metoCLOPramide (REGLAN) 5 MG tablet Take 1 tablet (5 mg total) by mouth every 8 (eight) hours as needed for nausea. 20 tablet 0   montelukast (SINGULAIR)  10 MG tablet Take 10 mg by mouth daily as needed (allergies).      nicotine (NICODERM CQ - DOSED IN MG/24 HR) 7 mg/24hr patch Place 7 mg onto the skin daily.     ranitidine (ZANTAC) 150 MG tablet Take 150 mg by mouth at bedtime.  5   sertraline (ZOLOFT) 50 MG tablet Take 1 tablet (50 mg total) by mouth 2 (two) times daily. 180 tablet 2   sodium chloride (OCEAN) 0.65 % SOLN nasal spray Place 1 spray into both nostrils as needed for congestion.     No current facility-administered medications for this visit.     Musculoskeletal: Strength & Muscle Tone: within normal limits Gait & Station: normal Patient leans: N/A  Psychiatric Specialty Exam: Review of Systems  Musculoskeletal:  Positive for back pain.  All other systems reviewed and are negative.   There were no vitals taken for this visit.There is no height or weight on file to calculate BMI.  General Appearance: Casual and Fairly Groomed  Eye Contact:  Good  Speech:  Clear and Coherent  Volume:  Normal  Mood:  Euthymic  Affect:  Appropriate and Congruent  Thought Process:  Goal Directed  Orientation:  Full (Time, Place, and Person)  Thought Content: WDL   Suicidal Thoughts:  No  Homicidal Thoughts:  No  Memory:  Immediate;   Good Recent;   Good Remote;   Fair  Judgement:  Good  Insight:  Fair  Psychomotor Activity:  Normal  Concentration:  Concentration: Good and Attention Span: Good  Recall:  Good  Fund of Knowledge: Good  Language: Good  Akathisia:  No  Handed:  Right  AIMS (if indicated): not done  Assets:  Communication Skills Desire for Improvement Resilience Social Support Talents/Skills  ADL's:  Intact  Cognition: WNL  Sleep:  Good   Screenings: PHQ2-9    Flowsheet Row Video Visit from 01/11/2022 in [facility] Video Visit from 09/14/2021 in [facility] Video Visit from 05/18/2021 in [facility] Video Visit from 01/05/2021 in [facility] Video Visit from 08/24/2020 in [facility]  PHQ-2 Total Score 0 0 0 0 0      Flowsheet Row Video Visit from 01/11/2022 in [facility] from 11/15/2021 in [facility] from 11/11/2021 in [facility] No Risk No Risk No Risk        Assessment and Plan: This patient is a 71 year old female with a history of depression and anxiety.  Despite the loss of her mother-in-law she is doing well on her current regimen.  She will continue Zoloft 50 mg twice daily for depression and anxiety and Xanax 0.5 mg 4 times daily for anxiety as needed.  She will return to see me in 3 months  Collaboration of Care: Collaboration of Care: Primary Care Provider AEB notes will be shared with PCP on the epic system  Patient/Guardian was advised Release of Information must be obtained prior to any record release in order to collaborate their care with an outside provider. Patient/Guardian was advised if they have not already done so to contact the registration department to sign all necessary forms in order for [place] to release information regarding their care.   Consent: Patient/Guardian gives verbal consent for treatment and assignment of benefits for services provided during this visit. Patient/Guardian expressed understanding and agreed to proceed.    [name], MD 01/11/2022, 2:15 PM

## 2022-04-13 ENCOUNTER — Telehealth (HOSPITAL_COMMUNITY): Payer: Medicare HMO | Admitting: Psychiatry

## 2022-05-03 ENCOUNTER — Telehealth (INDEPENDENT_AMBULATORY_CARE_PROVIDER_SITE_OTHER): Payer: Medicare HMO | Admitting: Psychiatry

## 2022-05-03 ENCOUNTER — Encounter (HOSPITAL_COMMUNITY): Payer: Self-pay | Admitting: Psychiatry

## 2022-05-03 DIAGNOSIS — F331 Major depressive disorder, recurrent, moderate: Secondary | ICD-10-CM | POA: Diagnosis not present

## 2022-05-03 MED ORDER — SERTRALINE HCL 50 MG PO TABS: 50.0000 mg | ORAL_TABLET | Freq: Two times a day (BID) | ORAL | 2 refills | Status: DC

## 2022-05-03 MED ORDER — ALPRAZOLAM 0.5 MG PO TABS: 0.5000 mg | ORAL_TABLET | Freq: Four times a day (QID) | ORAL | 2 refills | Status: DC

## 2022-05-03 NOTE — Progress Notes (Signed)
Virtual Visit via Video Note  I connected with Rebecca [name] on 05/03/22 at  1:40 PM EST by a video enabled telemedicine application and verified that I am speaking with the correct person using two identifiers.  Location: Patient: home Provider: office   I discussed the limitations of evaluation and management by telemedicine and the availability of in person appointments. The patient expressed understanding and agreed to proceed.    I discussed the assessment and treatment plan with the patient. The patient was provided an opportunity to ask questions and all were answered. The patient agreed with the plan and demonstrated an understanding of the instructions.   The patient was advised to call back or seek an in-person evaluation if the symptoms worsen or if the condition fails to improve as anticipated.  I provided 15 minutes of non-face-to-face time during this encounter.   [name], MD  [facility] MD/PA/NP OP Progress Note  05/03/2022 2:05 PM Rebecca [name]  MRN:  [id number]  Chief Complaint:  Chief Complaint  Patient presents with   Depression   Anxiety   Follow-up   HPI: This patient is a 72 year old white female who lives with her husband in [place].  She is a retired Quarry manager.  The patient returns for follow-up after 3 months regarding her anxiety and depression.  Overall she is doing fairly well.  She still suffers from chronic back pain and now her rectocele and cystocele are bothering her again.  Other than that her health is pretty good.  She denies significant depression anxiety suicidal thoughts with thoughts of self-harm.  She is sleeping well. Visit Diagnosis:    ICD-10-CM   1. Major depressive disorder, recurrent episode, moderate (HCC)  F33.1       Past Psychiatric History: 2 past psychiatric hospitalizations for depression  Past Medical History:  Past Medical History:  Diagnosis Date   Allergy    Amenia    Anxiety    Arthritis    Chronic back pain     Chronic kidney disease    cyst on bil kidneys   Colitis, ischemic (HCC)    Colon polyp    Complication of anesthesia    COPD (chronic obstructive pulmonary disease) (HCC)    Coronary artery disease    Depression    Dysphagia    Fibromyalgia    GERD (gastroesophageal reflux disease)    Hiatal hernia    Hyperlipidemia    Hypertension    IBS (irritable bowel syndrome)    Myocardial infarction ([place])    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 Colitis   SVT (supraventricular tachycardia)    Tinnitus    UTI (urinary tract infection)     Past Surgical History:  Procedure Laterality Date   ABDOMINAL HYSTERECTOMY     BACK SURGERY     BLADDER SURGERY     BREAST SURGERY     carple tunnal  surgery     Right hand   CHOLECYSTECTOMY N/A 03/26/2018   Procedure: LAPAROSCOPIC CHOLECYSTECTOMY WITH ATTEMPTED INTRAOPERATIVE CHOLANGIOGRAM;  Surgeon: Donnie Mesa, MD;  Location: [facility];  Service: General;  Laterality: N/A;   COLONOSCOPY  09/10/2005   in [place]  cyst on neck   pillonidal cyst     resection   TUBAL LIGATION     UPPER GI ENDOSCOPY  05/05/2011    Family Psychiatric History: See below  Family History:  Family History  Problem Relation Age of Onset   Alcohol abuse Father    Depression Father    Cancer Father        lung   Cancer Paternal Grandfather        lung   Osteoporosis Maternal Grandmother    Kyphosis Maternal Grandmother    Stroke Maternal Grandfather    Osteoporosis Mother    Kyphosis Mother    Stroke Mother    Heart disease Mother    Kidney disease Mother    Sleep apnea Mother        said used oxygen at night    Arthritis Brother    Arthritis Brother    Scoliosis Daughter    Scoliosis Son    Colon cancer Maternal Uncle     Diabetes Maternal Uncle    Irritable bowel syndrome Maternal Uncle    Esophageal cancer Neg Hx    Stomach cancer Neg Hx     Social History:  Social History   Socioeconomic History   Marital status: Married    Spouse name: Not on file   Number of children: 2   Years of education: Not on file   Highest education level: Not on file  Occupational History   Occupation: CNA  Tobacco Use   Smoking status: Every Day    Packs/day: 0.25    Years: 44.00    Total pack years: 11.00    Types: Cigarettes   Smokeless tobacco: Never   Tobacco comments:    smokes 4-6 cig daily  Vaping Use   Vaping Use: Never used  Substance and Sexual Activity   Alcohol use: No    Alcohol/week: 0.0 standard drinks of alcohol   Drug use: No   Sexual activity: Yes    Birth control/protection: Surgical    Comment: hyst  Other Topics Concern   Not on file  Social History Narrative   Not on file   Social Determinants of Health   Financial Resource Strain: Not on file  Food Insecurity: Not on file  Transportation Needs: Not on file  Physical Activity: Not on file  Stress: Not on file  Social Connections: Not on file    Allergies:  Allergies  Allergen Reactions   Bupropion Hcl Swelling   Doxycycline Other (See Comments)    When  she is on Valium    Simvastatin Other (See Comments)    Sleepy and weak   Toradol [Ketorolac Tromethamine] Other (See Comments)    Causes gi bleeding     Cephalexin Other (See Comments)    Burning with urination   Darvocet [Propoxyphene N-Acetaminophen] Anxiety   Penicillins Swelling and Rash    Has patient had a PCN reaction causing immediate rash, facial/tongue/throat swelling, SOB or lightheadedness with hypotension: No Has patient had a PCN reaction causing severe rash involving mucus membranes or skin necrosis: No Has patient had a PCN reaction that required hospitalization: No Has patient had a PCN reaction occurring within the last 10 years: No If all of  the above answers are "NO", then may proceed with Cephalosporin use.     Metabolic Disorder Labs: No results found for: "HGBA1C", "MPG" No results found for: "PROLACTIN" Lab Results  Component Value Date   CHOL 235 (H) 08/28/2008   TRIG  147 08/28/2008   HDL 52 08/28/2008   CHOLHDL 4.5 Ratio 08/28/2008   VLDL 29 08/28/2008   LDLCALC 154 (H) 08/28/2008   Lab Results  Component Value Date   TSH 1.511 08/28/2008   TSH 0.802 08/16/2007    Therapeutic Level Labs: No results found for: "LITHIUM" No results found for: "VALPROATE" No results found for: "CBMZ"  Current Medications: Current Outpatient Medications  Medication Sig Dispense Refill   acetaminophen (TYLENOL) 500 MG tablet Take 500 mg by mouth every 6 (six) hours as needed for moderate pain.     albuterol (PROVENTIL) (2.5 MG/3ML) 0.083% nebulizer solution Take 2.5 mg by nebulization every 6 (six) hours as needed for wheezing or shortness of breath.     ALPRAZolam (XANAX) 0.5 MG tablet Take 1 tablet (0.5 mg total) by mouth 4 (four) times daily. 120 tablet 2   amLODipine (NORVASC) 2.5 MG tablet Take by mouth.     conjugated estrogens (PREMARIN) vaginal cream Place 1 Applicatorful vaginally daily.      Cyanocobalamin (VITAMIN B-12 IJ) Inject 1,000 mcg as directed every 30 (thirty) days.      diclofenac Sodium (VOLTAREN) 1 % GEL PLACE ONTO THE SKIN 4 (FOUR) TIMES A DAY AS NEEDED. APPLY 4G TO SITE AS DIRECTED     fluticasone (FLONASE) 50 MCG/ACT nasal spray Place 1 spray into both nostrils daily.      metoCLOPramide (REGLAN) 5 MG tablet Take 1 tablet (5 mg total) by mouth every 8 (eight) hours as needed for nausea. 20 tablet 0   montelukast (SINGULAIR) 10 MG tablet Take 10 mg by mouth daily as needed (allergies).      nicotine (NICODERM CQ - DOSED IN MG/24 HR) 7 mg/24hr patch Place 7 mg onto the skin daily.     ranitidine (ZANTAC) 150 MG tablet Take 150 mg by mouth at bedtime.  5   sertraline (ZOLOFT) 50 MG tablet Take 1 tablet  (50 mg total) by mouth 2 (two) times daily. 180 tablet 2   sodium chloride (OCEAN) 0.65 % SOLN nasal spray Place 1 spray into both nostrils as needed for congestion.     No current facility-administered medications for this visit.     Musculoskeletal: Strength & Muscle Tone: within normal limits Gait & Station: normal Patient leans: N/A  Psychiatric Specialty Exam: Review of Systems  Genitourinary:  Positive for urgency.  Musculoskeletal:  Positive for arthralgias and back pain.  All other systems reviewed and are negative.   There were no vitals taken for this visit.There is no height or weight on file to calculate BMI.  General Appearance: Casual and Fairly Groomed  Eye Contact:  Good  Speech:  Clear and Coherent  Volume:  Normal  Mood:  Euthymic  Affect:  Appropriate and Congruent  Thought Process:  Goal Directed  Orientation:  Full (Time, Place, and Person)  Thought Content: WDL   Suicidal Thoughts:  No  Homicidal Thoughts:  No  Memory:  Immediate;   Good Recent;   Good Remote;   Fair  Judgement:  Good  Insight:  Good  Psychomotor Activity:  Normal  Concentration:  Concentration: Good and Attention Span: Good  Recall:  Good  Fund of Knowledge: Good  Language: Good  Akathisia:  No  Handed:  Right  AIMS (if indicated): not done  Assets:  Communication Skills Desire for Improvement Physical Health Resilience Social Support Talents/Skills  ADL's:  Intact  Cognition: WNL  Sleep:  Good   Screenings: PHQ2-9    Flowsheet  Row Video Visit from 01/11/2022 in [place] at [place] Video Visit from 09/14/2021 in [place] at [place] Video Visit from 05/18/2021 in [place] at [place] Video Visit from 01/05/2021 in [place] at [place] Video Visit from 08/24/2020 in [place] at [facility] Total Score 0 0 0  0 0      Flowsheet Row Video Visit from 01/11/2022 in [place] at [place] ED from 11/15/2021 in [facility] Emergency Department [facility] ED from 11/11/2021 in [facility] Emergency Department at [place] No Risk No Risk No Risk        Assessment and Plan: This patient is a 72 year old female with history of depression and anxiety.  She continues to do well on her current regimen.  She will continue Zoloft 50 mg twice daily for depression and anxiety and Xanax 0.5 mg 4 times daily as needed for anxiety.  She will return to see me in 3 months  Collaboration of Care: Collaboration of Care: Primary Care Provider AEB notes are shared with PCP on the epic system  Patient/Guardian was advised Release of Information must be obtained prior to any record release in order to collaborate their care with an outside provider. Patient/Guardian was advised if they have not already done so to contact the registration department to sign all necessary forms in order for [place] to release information regarding their care.   Consent: Patient/Guardian gives verbal consent for treatment and assignment of benefits for services provided during this visit. Patient/Guardian expressed understanding and agreed to proceed.    [name], MD 05/03/2022, 2:05 PM

## 2022-08-02 ENCOUNTER — Telehealth (INDEPENDENT_AMBULATORY_CARE_PROVIDER_SITE_OTHER): Payer: Medicare HMO | Admitting: Psychiatry

## 2022-08-02 ENCOUNTER — Encounter (HOSPITAL_COMMUNITY): Payer: Self-pay | Admitting: Psychiatry

## 2022-08-02 DIAGNOSIS — F331 Major depressive disorder, recurrent, moderate: Secondary | ICD-10-CM

## 2022-08-02 MED ORDER — ALPRAZOLAM 0.5 MG PO TABS
0.5000 mg | ORAL_TABLET | Freq: Four times a day (QID) | ORAL | 2 refills | Status: DC
Start: 2022-08-02 — End: 2022-09-05

## 2022-08-02 MED ORDER — SERTRALINE HCL 50 MG PO TABS: 50.0000 mg | ORAL_TABLET | Freq: Two times a day (BID) | ORAL | 2 refills | Status: DC

## 2022-08-02 NOTE — Progress Notes (Signed)
Virtual Visit via Video Note  I connected with Rebecca [name] on 08/02/22 at  1:00 PM EDT by a video enabled telemedicine application and verified that I am speaking with the correct person using two identifiers.  Location: Patient: home Provider: office   I discussed the limitations of evaluation and management by telemedicine and the availability of in person appointments. The patient expressed understanding and agreed to proceed.     I discussed the assessment and treatment plan with the patient. The patient was provided an opportunity to ask questions and all were answered. The patient agreed with the plan and demonstrated an understanding of the instructions.   The patient was advised to call back or seek an in-person evaluation if the symptoms worsen or if the condition fails to improve as anticipated.  I provided 15 minutes of non-face-to-face time during this encounter.   Diannia Ruder, MD  [facility] MD/PA/NP OP Progress Note  08/02/2022 1:16 PM Rebecca [name]  MRN:  [id number]  Chief Complaint:  Chief Complaint  Patient presents with   Depression   Anxiety   Follow-up   HPI: This patient is a 72 year old white female who lives with her husband in [place].  She is a retired Lawyer.  The patient returns for follow-up after 3 months regarding her anxiety and depression.  Overall she continues to do well.  She still is struggling with bladder prolapses and is going to have to have surgery.  Other than that her health has been good.  She denies significant depression anxiety suicidal thoughts or thoughts of self-harm.  She has been spending a lot of time painting and showed me a lot of her art work. Visit Diagnosis:    ICD-10-CM   1. Major depressive disorder, recurrent episode, moderate  F33.1       Past Psychiatric History: 2 past psychiatric hospitalizations for depression  Past Medical History:  Past Medical History:  Diagnosis Date   Allergy    Amenia    Anxiety     Arthritis    Chronic back pain    Chronic kidney disease    cyst on bil kidneys   Colitis, ischemic    Colon polyp    Complication of anesthesia    COPD (chronic obstructive pulmonary disease)    Coronary artery disease    Depression    Dysphagia    Fibromyalgia    GERD (gastroesophageal reflux disease)    Hiatal hernia    Hyperlipidemia    Hypertension    IBS (irritable bowel syndrome)    Myocardial infarction    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 Colitis   SVT (supraventricular tachycardia)    Tinnitus    UTI (urinary tract infection)     Past Surgical History:  Procedure Laterality Date   ABDOMINAL HYSTERECTOMY     BACK SURGERY     BLADDER SURGERY     BREAST SURGERY     carple tunnal  surgery     Right hand   CHOLECYSTECTOMY N/A 03/26/2018   Procedure: LAPAROSCOPIC CHOLECYSTECTOMY WITH ATTEMPTED INTRAOPERATIVE CHOLANGIOGRAM;  Surgeon: Manus Rudd, MD;  Location: [facility] OR;  Service: General;  Laterality: N/A;   COLONOSCOPY  09/10/2005   in [place]   DILATION AND CURETTAGE OF UTERUS  NECK SURGERY     cyst on neck   pillonidal cyst     resection   TUBAL LIGATION     UPPER GI ENDOSCOPY  05/05/2011    Family Psychiatric History: See below  Family History:  Family History  Problem Relation Age of Onset   Alcohol abuse Father    Depression Father    Cancer Father        lung   Cancer Paternal Grandfather        lung   Osteoporosis Maternal Grandmother    Kyphosis Maternal Grandmother    Stroke Maternal Grandfather    Osteoporosis Mother    Kyphosis Mother    Stroke Mother    Heart disease Mother    Kidney disease Mother    Sleep apnea Mother        said used oxygen at night    Arthritis Brother    Arthritis Brother    Scoliosis Daughter    Scoliosis Son    Colon cancer  Maternal Uncle    Diabetes Maternal Uncle    Irritable bowel syndrome Maternal Uncle    Esophageal cancer Neg Hx    Stomach cancer Neg Hx     Social History:  Social History   Socioeconomic History   Marital status: Married    Spouse name: Not on file   Number of children: 2   Years of education: Not on file   Highest education level: Not on file  Occupational History   Occupation: CNA  Tobacco Use   Smoking status: Every Day    Packs/day: 0.25    Years: 44.00    Additional pack years: 0.00    Total pack years: 11.00    Types: Cigarettes   Smokeless tobacco: Never   Tobacco comments:    smokes 4-6 cig daily  Vaping Use   Vaping Use: Never used  Substance and Sexual Activity   Alcohol use: No    Alcohol/week: 0.0 standard drinks of alcohol   Drug use: No   Sexual activity: Yes    Birth control/protection: Surgical    Comment: hyst  Other Topics Concern   Not on file  Social History Narrative   Not on file   Social Determinants of Health   Financial Resource Strain: Not on file  Food Insecurity: Not on file  Transportation Needs: Not on file  Physical Activity: Not on file  Stress: Not on file  Social Connections: Not on file    Allergies:  Allergies  Allergen Reactions   Bupropion Hcl Swelling   Doxycycline Other (See Comments)    When  she is on Valium    Simvastatin Other (See Comments)    Sleepy and weak   Toradol [Ketorolac Tromethamine] Other (See Comments)    Causes gi bleeding     Cephalexin Other (See Comments)    Burning with urination   Darvocet [Propoxyphene N-Acetaminophen] Anxiety   Penicillins Swelling and Rash    Has patient had a PCN reaction causing immediate rash, facial/tongue/throat swelling, SOB or lightheadedness with hypotension: No Has patient had a PCN reaction causing severe rash involving mucus membranes or skin necrosis: No Has patient had a PCN reaction that required hospitalization: No Has patient had a PCN reaction  occurring within the last 10 years: No If all of the above answers are "NO", then may proceed with Cephalosporin use.     Metabolic Disorder Labs: No results found for: "HGBA1C", "MPG" No results found for: "PROLACTIN" Lab Results  Component Value Date   CHOL 235 (H) 08/28/2008   TRIG 147 08/28/2008   HDL 52 08/28/2008   CHOLHDL 4.5 Ratio 08/28/2008   VLDL 29 08/28/2008   LDLCALC 154 (H) 08/28/2008   Lab Results  Component Value Date   TSH 1.511 08/28/2008   TSH 0.802 08/16/2007    Therapeutic Level Labs: No results found for: "LITHIUM" No results found for: "VALPROATE" No results found for: "CBMZ"  Current Medications: Current Outpatient Medications  Medication Sig Dispense Refill   acetaminophen (TYLENOL) 500 MG tablet Take 500 mg by mouth every 6 (six) hours as needed for moderate pain.     albuterol (PROVENTIL) (2.5 MG/3ML) 0.083% nebulizer solution Take 2.5 mg by nebulization every 6 (six) hours as needed for wheezing or shortness of breath.     ALPRAZolam (XANAX) 0.5 MG tablet Take 1 tablet (0.5 mg total) by mouth 4 (four) times daily. 120 tablet 2   amLODipine (NORVASC) 2.5 MG tablet Take by mouth.     conjugated estrogens (PREMARIN) vaginal cream Place 1 Applicatorful vaginally daily.      Cyanocobalamin (VITAMIN B-12 IJ) Inject 1,000 mcg as directed every 30 (thirty) days.      diclofenac Sodium (VOLTAREN) 1 % GEL PLACE ONTO THE SKIN 4 (FOUR) TIMES A DAY AS NEEDED. APPLY 4G TO SITE AS DIRECTED     fluticasone (FLONASE) 50 MCG/ACT nasal spray Place 1 spray into both nostrils daily.      metoCLOPramide (REGLAN) 5 MG tablet Take 1 tablet (5 mg total) by mouth every 8 (eight) hours as needed for nausea. 20 tablet 0   montelukast (SINGULAIR) 10 MG tablet Take 10 mg by mouth daily as needed (allergies).      nicotine (NICODERM CQ - DOSED IN MG/24 HR) 7 mg/24hr patch Place 7 mg onto the skin daily.     ranitidine (ZANTAC) 150 MG tablet Take 150 mg by mouth at bedtime.  5    sertraline (ZOLOFT) 50 MG tablet Take 1 tablet (50 mg total) by mouth 2 (two) times daily. 180 tablet 2   sodium chloride (OCEAN) 0.65 % SOLN nasal spray Place 1 spray into both nostrils as needed for congestion.     No current facility-administered medications for this visit.     Musculoskeletal: Strength & Muscle Tone: within normal limits Gait & Station: normal Patient leans: N/A  Psychiatric Specialty Exam: Review of Systems  All other systems reviewed and are negative.   There were no vitals taken for this visit.There is no height or weight on file to calculate BMI.  General Appearance: Casual and Fairly Groomed  Eye Contact:  Good  Speech:  Clear and Coherent  Volume:  Normal  Mood:  Euthymic  Affect:  Congruent  Thought Process:  Goal Directed  Orientation:  Full (Time, Place, and Person)  Thought Content: WDL   Suicidal Thoughts:  No  Homicidal Thoughts:  No  Memory:  Immediate;   Good Recent;   Good Remote;   NA  Judgement:  Good  Insight:  Good  Psychomotor Activity:  Normal  Concentration:  Concentration: Good and Attention Span: Good  Recall:  Good  Fund of Knowledge: Good  Language: Good  Akathisia:  No  Handed:  Right  AIMS (if indicated): not done  Assets:  Communication Skills Desire for Improvement Resilience Social Support Talents/Skills  ADL's:  Intact  Cognition: WNL  Sleep:  Good   Screenings: PHQ2-9    Flowsheet Row Video Visit from 01/11/2022 in [place]  [facility] Video Visit from 09/14/2021 in [facility] at [place] Video Visit from 05/18/2021 in [facility] at [place] Video Visit from 01/05/2021 in [facility] at [place] Video Visit from 08/24/2020 in [facility] at [facility] Total Score 0 0 0 0 0      Flowsheet Row Video Visit from 01/11/2022 in [place] [facility] at [place] ED from 11/15/2021 in [facility] Emergency Department at [facility] ED from 11/11/2021 in [facility] Emergency Department at [facility]  C-SSRS RISK CATEGORY No Risk No Risk No Risk        Assessment and Plan: This patient is a 72 year old female with a history of depression and anxiety.  She continues to do well on her current regimen.  She will continue Zoloft 50 mg twice daily for depression and anxiety and Xanax 0.5 mg 4 times daily as needed for anxiety.  She will return to see me in 3 months  Collaboration of Care: Collaboration of Care: Primary Care Provider AEB notes are shared with PCP on the epic system  Patient/Guardian was advised Release of Information must be obtained prior to any record release in order to collaborate their care with an outside provider. Patient/Guardian was advised if they have not already done so to contact the registration department to sign all necessary forms in order for [place] to release information regarding their care.   Consent: Patient/Guardian gives verbal consent for treatment and assignment of benefits for services provided during this visit. Patient/Guardian expressed understanding and agreed to proceed.    Diannia Ruder, MD 08/02/2022, 1:16 PM

## 2022-09-01 ENCOUNTER — Other Ambulatory Visit (HOSPITAL_COMMUNITY): Payer: Self-pay | Admitting: Psychiatry

## 2022-12-30 ENCOUNTER — Other Ambulatory Visit (HOSPITAL_COMMUNITY): Payer: Self-pay | Admitting: Psychiatry

## 2023-01-01 ENCOUNTER — Telehealth (HOSPITAL_COMMUNITY): Payer: Self-pay

## 2023-01-01 NOTE — Telephone Encounter (Signed)
Spoke with pt advised rx has been sent to pharmacy pt verbalized understanding

## 2023-01-01 NOTE — Telephone Encounter (Signed)
sent 

## 2023-01-01 NOTE — Telephone Encounter (Signed)
Pt called needing a refill on her ALPRAZolam (XANAX) 0.5 MG tablet sent to [facility] in [place]. Pt scheduled for 01/11/23. Please advise.

## 2023-01-11 ENCOUNTER — Encounter (HOSPITAL_COMMUNITY): Payer: Self-pay | Admitting: Psychiatry

## 2023-01-11 ENCOUNTER — Telehealth (HOSPITAL_COMMUNITY): Payer: Medicare HMO | Admitting: Psychiatry

## 2023-01-11 DIAGNOSIS — F331 Major depressive disorder, recurrent, moderate: Secondary | ICD-10-CM

## 2023-01-11 DIAGNOSIS — F341 Dysthymic disorder: Secondary | ICD-10-CM | POA: Diagnosis not present

## 2023-01-11 MED ORDER — SERTRALINE HCL 50 MG PO TABS: 50.0000 mg | ORAL_TABLET | Freq: Two times a day (BID) | ORAL | 2 refills | Status: DC

## 2023-01-11 MED ORDER — ALPRAZOLAM 0.5 MG PO TABS: 0.5000 mg | ORAL_TABLET | Freq: Four times a day (QID) | ORAL | 5 refills | Status: DC | PRN

## 2023-01-11 NOTE — Progress Notes (Signed)
Virtual Visit via Video Note  I connected with Rebecca [name] on 01/11/23 at  2:20 PM EDT by a video enabled telemedicine application and verified that I am speaking with the correct person using two identifiers.  Location: Patient: home Provider: office   I discussed the limitations of evaluation and management by telemedicine and the availability of in person appointments. The patient expressed understanding and agreed to proceed.     I discussed the assessment and treatment plan with the patient. The patient was provided an opportunity to ask questions and all were answered. The patient agreed with the plan and demonstrated an understanding of the instructions.   The patient was advised to call back or seek an in-person evaluation if the symptoms worsen or if the condition fails to improve as anticipated.  I provided 15 minutes of non-face-to-face time during this encounter.   Diannia Ruder, MD  [facility] MD/PA/NP OP Progress Note  01/11/2023 2:37 PM Rebecca [name]  MRN:  [id number]  Chief Complaint:  Chief Complaint  Patient presents with   Anxiety   Depression   Follow-up   HPI: This patient is a 72 year old white female who lives with her husband in [place].  She is a retired Lawyer.  The patient returns for follow-up after 6 months regarding her depression and anxiety.  She states that she has been stable.  For the most part her health is okay.  She did get an injection that has helped her rib and back pain.  She is not yet had a surgery for bladder prolapse.  She denies significant depression anxiety suicidal thoughts or thoughts of self-harm.  She is sleeping well. Visit Diagnosis:    ICD-10-CM   1. Major depressive disorder, recurrent episode, moderate (HCC)  F33.1     2. ANXIETY DEPRESSION  F34.1       Past Psychiatric History: 2 past psychiatric hospitalizations for depression  Past Medical History:  Past Medical History:  Diagnosis Date   Allergy    Amenia     Anxiety    Arthritis    Chronic back pain    Chronic kidney disease    cyst on bil kidneys   Colitis, ischemic (HCC)    Colon polyp    Complication of anesthesia    COPD (chronic obstructive pulmonary disease) (HCC)    Coronary artery disease    Depression    Dysphagia    Fibromyalgia    GERD (gastroesophageal reflux disease)    Hiatal hernia    Hyperlipidemia    Hypertension    IBS (irritable bowel syndrome)    Myocardial infarction (HCC)    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 Colitis   SVT (supraventricular tachycardia) (HCC)    Tinnitus    UTI (urinary tract infection)     Past Surgical History:  Procedure Laterality Date   ABDOMINAL HYSTERECTOMY     BACK SURGERY     BLADDER SURGERY     BREAST SURGERY     carple tunnal  surgery     Right hand   CHOLECYSTECTOMY N/A 03/26/2018   Procedure: LAPAROSCOPIC CHOLECYSTECTOMY WITH ATTEMPTED INTRAOPERATIVE CHOLANGIOGRAM;  Surgeon: Manus Rudd, MD;  Location: [facility] OR;  Service: General;  Laterality: N/A;   COLONOSCOPY  09/10/2005  in [place]   DILATION AND CURETTAGE OF UTERUS     NECK SURGERY     cyst on neck   pillonidal cyst     resection   TUBAL LIGATION     UPPER GI ENDOSCOPY  05/05/2011    Family Psychiatric History: See below  Family History:  Family History  Problem Relation Age of Onset   Alcohol abuse Father    Depression Father    Cancer Father        lung   Cancer Paternal Grandfather        lung   Osteoporosis Maternal Grandmother    Kyphosis Maternal Grandmother    Stroke Maternal Grandfather    Osteoporosis Mother    Kyphosis Mother    Stroke Mother    Heart disease Mother    Kidney disease Mother    Sleep apnea Mother        said used oxygen at night    Arthritis Brother    Arthritis Brother    Scoliosis  Daughter    Scoliosis Son    Colon cancer Maternal Uncle    Diabetes Maternal Uncle    Irritable bowel syndrome Maternal Uncle    Esophageal cancer Neg Hx    Stomach cancer Neg Hx     Social History:  Social History   Socioeconomic History   Marital status: Married    Spouse name: Not on file   Number of children: 2   Years of education: Not on file   Highest education level: Not on file  Occupational History   Occupation: CNA  Tobacco Use   Smoking status: Every Day    Current packs/day: 0.25    Average packs/day: 0.3 packs/day for 44.0 years (11.0 ttl pk-yrs)    Types: Cigarettes   Smokeless tobacco: Never   Tobacco comments:    smokes 4-6 cig daily  Vaping Use   Vaping status: Never Used  Substance and Sexual Activity   Alcohol use: No    Alcohol/week: 0.0 standard drinks of alcohol   Drug use: No   Sexual activity: Yes    Birth control/protection: Surgical    Comment: hyst  Other Topics Concern   Not on file  Social History Narrative   Not on file   Social Determinants of Health   Financial Resource Strain: Low Risk  (04/16/2022)   Received from [facility]   Overall Financial Resource Strain (CARDIA)    Difficulty of Paying Living Expenses: Not very hard  Food Insecurity: No Food Insecurity (04/16/2022)   Received from [facility], [facility]   Hunger Vital Sign    Worried About Running Out of Food in the Last Year: Never true    Ran Out of Food in the Last Year: Never true  Transportation Needs: No Transportation Needs (04/16/2022)   Received from [employer], [facility]   PRAPARE - Transportation    Lack of Transportation (Medical): No    Lack of Transportation (Non-Medical): No  Physical Activity: Sufficiently Active (04/16/2022)   Received from [facility], [facility]   Exercise Vital Sign    Days of Exercise per Week: 5 days    Minutes of Exercise per Session: 30 min  Stress: Stress Concern Present (04/16/2022)   Received  from [facility], [facility] - Occupational Stress Questionnaire    Feeling of Stress : To some extent  Social Connections: Moderately Integrated (04/16/2022)   Received from [facility], [facility]   Social Network    How would you rate your social network (family, work, friends)?: Adequate participation with social networks    Allergies:  Allergies  Allergen Reactions   Bupropion Hcl Swelling   Doxycycline Other (See Comments)    When  she is on Valium    Simvastatin Other (See Comments)    Sleepy and weak   Toradol [Ketorolac Tromethamine] Other (See Comments)    Causes gi bleeding     Cephalexin Other (See Comments)    Burning with urination   Darvocet [Propoxyphene N-Acetaminophen] Anxiety   Penicillins Swelling and Rash    Has patient had a PCN reaction causing immediate rash, facial/tongue/throat swelling, SOB or lightheadedness with hypotension: No Has patient had a PCN reaction causing severe rash involving mucus membranes or skin necrosis: No Has patient had a PCN reaction that required hospitalization: No Has patient had a PCN reaction occurring within the last 10 years: No If all of the above answers are "NO", then may proceed with Cephalosporin use.     Metabolic Disorder Labs: No results found for: "HGBA1C", "MPG" No results found for: "PROLACTIN" Lab Results  Component Value Date   CHOL 235 (H) 08/28/2008   TRIG 147 08/28/2008   HDL 52 08/28/2008   CHOLHDL 4.5 Ratio 08/28/2008   VLDL 29 08/28/2008   LDLCALC 154 (H) 08/28/2008   Lab Results  Component Value Date   TSH 1.511 08/28/2008   TSH 0.802 08/16/2007    Therapeutic Level Labs: No results found for: "LITHIUM" No results found for: "VALPROATE" No results found for: "CBMZ"  Current Medications: Current Outpatient Medications  Medication Sig Dispense Refill   acetaminophen (TYLENOL) 500 MG tablet Take 500 mg by mouth every 6 (six) hours as  needed for moderate pain.     albuterol (PROVENTIL) (2.5 MG/3ML) 0.083% nebulizer solution Take 2.5 mg by nebulization every 6 (six) hours as needed for wheezing or shortness of breath.     ALPRAZolam (XANAX) 0.5 MG tablet Take 1 tablet (0.5 mg total) by mouth 4 (four) times daily as needed for anxiety. 120 tablet 5   amLODipine (NORVASC) 2.5 MG tablet Take by mouth.     conjugated estrogens (PREMARIN) vaginal cream Place 1 Applicatorful vaginally daily.      Cyanocobalamin (VITAMIN B-12 IJ) Inject 1,000 mcg as directed every 30 (thirty) days.      diclofenac Sodium (VOLTAREN) 1 % GEL PLACE ONTO THE SKIN 4 (FOUR) TIMES A DAY AS NEEDED. APPLY 4G TO SITE AS DIRECTED     fluticasone (FLONASE) 50 MCG/ACT nasal spray Place 1 spray into both nostrils daily.      metoCLOPramide (REGLAN) 5 MG tablet Take 1 tablet (5 mg total) by mouth every 8 (eight) hours as needed for nausea. 20 tablet 0   montelukast (SINGULAIR) 10 MG tablet Take 10 mg by mouth daily as needed (allergies).      nicotine (NICODERM CQ - DOSED IN MG/24 HR) 7 mg/24hr patch Place 7 mg onto the skin daily.     ranitidine (ZANTAC) 150 MG tablet Take 150 mg by mouth at bedtime.  5   sertraline (ZOLOFT) 50 MG tablet Take 1 tablet (50 mg total) by mouth 2 (two) times daily. 180 tablet 2   sodium chloride (OCEAN) 0.65 % SOLN nasal spray Place 1 spray into both nostrils as needed for congestion.     No current facility-administered medications for this visit.     Musculoskeletal: Strength & Muscle Tone:  within normal limits Gait & Station: normal Patient leans: N/A  Psychiatric Specialty Exam: Review of Systems  Genitourinary:  Positive for frequency.  Musculoskeletal:  Positive for back pain.  All other systems reviewed and are negative.   There were no vitals taken for this visit.There is no height or weight on file to calculate BMI.  General Appearance: Casual and Fairly Groomed  Eye Contact:  Good  Speech:  Clear and Coherent   Volume:  Normal  Mood:  Euthymic  Affect:  Congruent  Thought Process:  Goal Directed  Orientation:  Full (Time, Place, and Person)  Thought Content: WDL   Suicidal Thoughts:  No  Homicidal Thoughts:  No  Memory:  Immediate;   Good Recent;   Good Remote;   NA  Judgement:  Good  Insight:  Fair  Psychomotor Activity:  Normal  Concentration:  Concentration: Good and Attention Span: Good  Recall:  Good  Fund of Knowledge: Good  Language: Good  Akathisia:  No  Handed:  Right  AIMS (if indicated): not done  Assets:  Communication Skills Desire for Improvement Resilience Social Support Talents/Skills  ADL's:  Intact  Cognition: WNL  Sleep:  Good   Screenings: PHQ2-9    Flowsheet Row Video Visit from 01/11/2022 in [facility] at [place] Video Visit from 09/14/2021 in [facility] at [place] Video Visit from 05/18/2021 in [facility] at [place] Video Visit from 01/05/2021 in [facility] at [place] Video Visit from 08/24/2020 in [facility] at [facility] Total Score 0 0 0 0 0      Flowsheet Row Video Visit from 01/11/2022 in [facility] at [place] ED from 11/15/2021 in [facility] Emergency Department at [facility] ED from 11/11/2021 in [facility] Emergency Department at [facility]  C-SSRS RISK CATEGORY No Risk No Risk No Risk        Assessment and Plan: This patient is a 72 year old female with a history of depression and anxiety.  She continues to do well on her current regimen.  She will continue Zoloft 50 mg twice daily for depression and anxiety and Xanax 0.5 mg 4 times daily as needed for anxiety.  She will return to see me in 6 months  Collaboration of Care: Collaboration of Care: Primary Care Provider AEB notes are shared with PCP on the epic system  Patient/Guardian  was advised Release of Information must be obtained prior to any record release in order to collaborate their care with an outside provider. Patient/Guardian was advised if they have not already done so to contact the registration department to sign all necessary forms in order for [place] to release information regarding their care.   Consent: Patient/Guardian gives verbal consent for treatment and assignment of benefits for services provided during this visit. Patient/Guardian expressed understanding and agreed to proceed.    Diannia Ruder, MD 01/11/2023, 2:37 PM

## 2023-04-07 ENCOUNTER — Ambulatory Visit
Admission: EM | Admit: 2023-04-07 | Discharge: 2023-04-07 | Disposition: A | Discharge status: Home / Self Care | Payer: Medicare HMO | Attending: Family Medicine | Admitting: Family Medicine

## 2023-04-07 ENCOUNTER — Telehealth: Payer: Self-pay

## 2023-04-07 ENCOUNTER — Other Ambulatory Visit: Payer: Self-pay | Admitting: Family Medicine

## 2023-04-07 ENCOUNTER — Telehealth: Payer: Self-pay | Admitting: Family Medicine

## 2023-04-07 DIAGNOSIS — J22 Unspecified acute lower respiratory infection: Secondary | ICD-10-CM

## 2023-04-07 DIAGNOSIS — J3089 Other allergic rhinitis: Secondary | ICD-10-CM

## 2023-04-07 MED ORDER — FLUTICASONE PROPIONATE 50 MCG/ACT NA SUSP: 1.0000 | Freq: Two times a day (BID) | NASAL | 2 refills | Status: AC

## 2023-04-07 MED ORDER — ALBUTEROL SULFATE HFA 108 (90 BASE) MCG/ACT IN AERS: 2.0000 | INHALATION_SPRAY | RESPIRATORY_TRACT | 0 refills | Status: AC | PRN

## 2023-04-07 MED ORDER — PREDNISONE 20 MG PO TABS: 40.0000 mg | ORAL_TABLET | Freq: Every day | ORAL | 0 refills | Status: AC

## 2023-04-07 MED ORDER — CETIRIZINE HCL 10 MG PO TABS: 10.0000 mg | ORAL_TABLET | Freq: Every day | ORAL | 2 refills | Status: AC

## 2023-04-07 MED ORDER — AZITHROMYCIN 250 MG PO TABS: ORAL_TABLET | ORAL | 0 refills | Status: AC

## 2023-04-07 MED ORDER — PROMETHAZINE-DM 6.25-15 MG/5ML PO SYRP: 5.0000 mL | ORAL_SOLUTION | Freq: Four times a day (QID) | ORAL | 0 refills | Status: AC | PRN

## 2023-04-07 MED ORDER — DOXYCYCLINE HYCLATE 100 MG PO CAPS: 100.0000 mg | ORAL_CAPSULE | Freq: Two times a day (BID) | ORAL | 0 refills | Status: AC

## 2023-04-07 NOTE — Telephone Encounter (Signed)
Doxycycline switch to Zithromax as patient states she breaks out in a rash on the doxycycline.  Per chart review she only has an intolerance while taking Valium which she has not currently taking which is why the medication was initially sent.

## 2023-04-07 NOTE — Telephone Encounter (Signed)
Pt called stating tat she is allergic to doxycycline and that the interaction on her chart is incorrect. She states it causes her rashes. I informed the provider and she switched antibiotic to zithromax. Pt notified and verbalized understanding.

## 2023-04-07 NOTE — ED Provider Notes (Signed)
[facility]    CSN: [id number] Arrival date & time: 04/07/23  0806      History   Chief Complaint No chief complaint on file.   HPI Rebecca [name] is a 72 y.o. female.   Patient presenting today with several month history of ongoing nasal congestion, sinus pressure, ear pressure and popping, productive hacking cough, pleuritic chest pain.  Denies fever, chills, chest pain, abdominal pain, nausea vomiting or diarrhea.  History of seasonal allergies not currently on anything for this.  States the symptoms always start when fall comes around and she is tends to stay sick through the winter.  She also has a history of COPD not currently on any inhalers.  Has had pneumonia and pleurisy multiple times additionally per patient.  Was seen in a different urgent care a week or so ago and given azithromycin which she states was helping but symptoms have returned since coming off of it.  They also gave Phenergan DM which was helping break up the cough.    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 08/15/2007   ANXIETY DEPRESSION 08/01/2007   TOBACCO ABUSE 08/01/2007   Vascular insufficiency of intestine (HCC) 08/01/2007   DISC DISEASE, LUMBOSACRAL SPINE 08/01/2007    Past Surgical History:  Procedure Laterality Date   ABDOMINAL HYSTERECTOMY     BACK SURGERY     BLADDER SURGERY     BREAST SURGERY     carple tunnal  surgery     Right hand   CHOLECYSTECTOMY N/A 03/26/2018   Procedure: LAPAROSCOPIC CHOLECYSTECTOMY WITH ATTEMPTED INTRAOPERATIVE CHOLANGIOGRAM;  Surgeon: Manus Rudd, MD;  Location: [facility] OR;  Service: General;  Laterality: N/A;   COLONOSCOPY  09/10/2005   in [place]   DILATION AND CURETTAGE OF UTERUS     NECK SURGERY     cyst on neck   pillonidal cyst     resection   TUBAL LIGATION     UPPER GI ENDOSCOPY  05/05/2011    OB History     Gravida  2   Para  2   Term  2   Preterm      AB      Living  2      SAB      IAB      Ectopic      Multiple      Live Births  2            Home Medications    Prior to Admission medications   Medication Sig Start Date End Date Taking? Authorizing Provider  albuterol (VENTOLIN HFA) 108 (90 Base) MCG/ACT inhaler Inhale 2 puffs into the lungs every 4 (four) hours as needed. 04/07/23  Yes Particia Nearing, PA-C  cetirizine (ZYRTEC ALLERGY) 10 MG tablet Take 1 tablet (10 mg total) by mouth daily. 04/07/23  Yes Particia Nearing, PA-C  doxycycline (VIBRAMYCIN) 100 MG capsule Take 1 capsule (100 mg total) by mouth 2 (two) times daily. 04/07/23  Yes Particia Nearing, PA-C  fluticasone [facility]) 50 MCG/ACT nasal spray Place 1 spray into both nostrils 2 (two) times daily. 04/07/23  Yes Particia Nearing, PA-C  predniSONE (DELTASONE) 20 MG tablet Take 2 tablets (40 mg total) by mouth daily with breakfast. 04/07/23  Yes Particia Nearing, PA-C  promethazine-dextromethorphan (PROMETHAZINE-DM) 6.25-15 MG/5ML syrup Take 5 mLs by mouth 4 (four) times daily as needed. 04/07/23  Yes Particia Nearing, PA-C  acetaminophen (TYLENOL) 500 MG tablet Take 500 mg by mouth every 6 (six) hours as needed for moderate pain.    [provider]  albuterol (PROVENTIL) (2.5 MG/3ML) 0.083% nebulizer solution Take 2.5 mg by nebulization every 6 (six) hours as needed for wheezing or shortness of breath.    [provider]  ALPRAZolam Prudy Feeler) 0.5 MG tablet Take 1 tablet (0.5 mg total) by mouth 4 (four) times daily as needed for anxiety. 01/11/23   Myrlene Broker, MD  amLODipine (NORVASC) 2.5 MG tablet Take by mouth. 04/05/20   [provider]  conjugated estrogens (PREMARIN) vaginal cream Place 1 Applicatorful vaginally daily.  11/08/13   [provider]  Cyanocobalamin (VITAMIN B-12 IJ) Inject 1,000 mcg as directed every 30 (thirty) days.     [provider]  diclofenac Sodium (VOLTAREN) 1 % GEL PLACE ONTO THE SKIN 4 (FOUR) TIMES A DAY AS NEEDED. APPLY 4G TO SITE AS DIRECTED 03/09/19   [provider]  fluticasone (FLONASE) 50 MCG/ACT nasal spray Place 1 spray into both nostrils daily.  03/06/17   [provider]  metoCLOPramide (REGLAN) 5 MG tablet Take 1 tablet (5 mg total) by mouth every 8 (eight) hours as needed for nausea. 11/15/21   Gloris Manchester, MD  montelukast (SINGULAIR) 10 MG tablet Take 10 mg by mouth daily as needed (allergies).     [provider]  nicotine (NICODERM CQ - DOSED IN MG/24 HR) 7 mg/24hr patch Place 7 mg onto the skin daily.    [provider]  ranitidine  (ZANTAC) 150 MG tablet Take 150 mg by mouth at bedtime. 01/25/18   [provider]  sertraline (ZOLOFT) 50 MG tablet Take 1 tablet (50 mg total) by mouth 2 (two) times daily. 01/11/23 01/11/24  Myrlene Broker, MD  sodium chloride (OCEAN) 0.65 % SOLN nasal spray Place 1 spray into both nostrils as needed for congestion.    [provider]    Family History Family History  Problem Relation Age  of Onset   Alcohol abuse Father    Depression Father    Cancer Father        lung   Cancer Paternal Grandfather        lung   Osteoporosis Maternal Grandmother    Kyphosis Maternal Grandmother    Stroke Maternal Grandfather    Osteoporosis Mother    Kyphosis Mother    Stroke Mother    Heart disease Mother    Kidney disease Mother    Sleep apnea Mother        said used oxygen at night    Arthritis Brother    Arthritis Brother    Scoliosis Daughter    Scoliosis Son    Colon cancer Maternal Uncle    Diabetes Maternal Uncle    Irritable bowel syndrome Maternal Uncle    Esophageal cancer Neg Hx    Stomach cancer Neg Hx     Social History Social History   Tobacco Use   Smoking status: Every Day    Current packs/day: 0.25    Average packs/day: 0.3 packs/day for 44.0 years (11.0 ttl pk-yrs)    Types: Cigarettes   Smokeless tobacco: Never   Tobacco comments:    smokes 4-6 cig daily  Vaping Use   Vaping status: Never Used  Substance Use Topics   Alcohol use: No    Alcohol/week: 0.0 standard drinks of alcohol   Drug use: No     Allergies   Bupropion hcl, Doxycycline, Simvastatin, Toradol [ketorolac tromethamine], Cephalexin, Darvocet [propoxyphene n-acetaminophen], and Penicillins   Review of Systems Review of Systems Per HPI  Physical Exam Triage Vital Signs ED Triage Vitals  Encounter Vitals Group     BP 04/07/23 0816 (!) 146/75     Systolic BP Percentile --      Diastolic BP Percentile --      Pulse Rate 04/07/23 0816 75     Resp 04/07/23 0816 16      Temp 04/07/23 0816 98.4 F (36.9 C)     Temp Source 04/07/23 0816 Oral     SpO2 04/07/23 0816 91 %     Weight --      Height --      Head Circumference --      Peak Flow --      Pain Score 04/07/23 0819 0     Pain Loc --      Pain Education --      Exclude from Growth Chart --    No data found.  Updated Vital Signs BP (!) 146/75 (BP Location: Right Arm)   Pulse 75   Temp 98.4 F (36.9 C) (Oral)   Resp 16   SpO2 91%   Visual Acuity Right Eye Distance:   Left Eye Distance:   Bilateral Distance:    Right Eye Near:   Left Eye Near:    Bilateral Near:     Physical Exam Vitals and nursing note reviewed.  Constitutional:      Appearance: Normal appearance.  HENT:     Head: Atraumatic.     Right Ear: Tympanic membrane and external ear normal.     Left Ear: Tympanic membrane and external ear normal.     Nose: Congestion present.     Mouth/Throat:     Mouth: Mucous membranes are moist.     Pharynx: Posterior oropharyngeal erythema present.  Eyes:     Extraocular Movements: Extraocular movements intact.     Conjunctiva/sclera: Conjunctivae normal.  Cardiovascular:  Rate and Rhythm: Normal rate and regular rhythm.     Heart sounds: Normal heart sounds.  Pulmonary:     Effort: Pulmonary effort is normal.     Breath sounds: Wheezing and rales present.  Musculoskeletal:        General: Normal range of motion.     Cervical back: Normal range of motion and neck supple.  Skin:    General: Skin is warm and dry.  Neurological:     Mental Status: She is alert and oriented to person, place, and time.  Psychiatric:        Mood and Affect: Mood normal.        Thought Content: Thought content normal.      UC Treatments / Results  Labs (all labs ordered are listed, but only abnormal results are displayed) Labs Reviewed - No data to display  EKG   Radiology No results found.  Procedures Procedures (including critical care time)  Medications Ordered in  UC Medications - No data to display  Initial Impression / Assessment and Plan / UC Course  I have reviewed the triage vital signs and the nursing notes.  Pertinent labs & imaging results that were available during my care of the patient were reviewed by me and considered in my medical decision making (see chart for details).     Mildly hypertensive in triage, otherwise vital signs within normal limits.  Oxygen saturation improved from 91% to 93% on recheck during exam on room air.  She is well-appearing and in no acute distress.  Suspect underlying uncontrolled seasonal allergies causing sinusitis, lower respiratory infection.  Will treat with prednisone, doxycycline, albuterol, Phenergan DM and start good allergy regimen of Zyrtec and Flonase consistently.  Discussed support over-the-counter medications, home care such as Coricidin HP, plain Mucinex, sinus rinses.  Return for worsening symptoms.  Final Clinical Impressions(s) / UC Diagnoses   Final diagnoses:  Lower respiratory infection  Seasonal allergic rhinitis due to other allergic trigger   Discharge Instructions   None    ED Prescriptions     Medication Sig Dispense Auth. Provider   doxycycline (VIBRAMYCIN) 100 MG capsule Take 1 capsule (100 mg total) by mouth 2 (two) times daily. 20 capsule Particia Nearing, [place]   predniSONE (DELTASONE) 20 MG tablet Take 2 tablets (40 mg total) by mouth daily with breakfast. 10 tablet Particia Nearing, PA-C   albuterol (VENTOLIN HFA) 108 (90 Base) MCG/ACT inhaler Inhale 2 puffs into the lungs every 4 (four) hours as needed. 18 g Particia Nearing, [place]   cetirizine (ZYRTEC ALLERGY) 10 MG tablet Take 1 tablet (10 mg total) by mouth daily. 30 tablet Particia Nearing, PA-C   fluticasone [facility]) 50 MCG/ACT nasal spray Place 1 spray into both nostrils 2 (two) times daily. 16 g Roosvelt Maser [place], [place]   promethazine-dextromethorphan (PROMETHAZINE-DM) 6.25-15 MG/5ML  syrup Take 5 mLs by mouth 4 (four) times daily as needed. 100 mL Particia Nearing, [place]      PDMP not reviewed this encounter.   Roosvelt Maser [place], [place] 04/07/23 [phone]

## 2023-04-07 NOTE — ED Triage Notes (Signed)
Pt reports she has sinus, chest, and lung infection states its in her back too x 2 months

## 2023-06-30 ENCOUNTER — Other Ambulatory Visit: Payer: Self-pay | Admitting: Family Medicine

## 2023-07-12 ENCOUNTER — Encounter (HOSPITAL_COMMUNITY): Payer: Self-pay | Admitting: Psychiatry

## 2023-07-12 ENCOUNTER — Telehealth (HOSPITAL_COMMUNITY): Payer: Medicare HMO | Admitting: Psychiatry

## 2023-07-12 DIAGNOSIS — F331 Major depressive disorder, recurrent, moderate: Secondary | ICD-10-CM

## 2023-07-12 MED ORDER — SERTRALINE HCL 50 MG PO TABS: 50.0000 mg | ORAL_TABLET | Freq: Two times a day (BID) | ORAL | 2 refills | Status: DC

## 2023-07-12 MED ORDER — ALPRAZOLAM 0.5 MG PO TABS: 0.5000 mg | ORAL_TABLET | Freq: Four times a day (QID) | ORAL | 5 refills | Status: DC | PRN

## 2023-07-12 NOTE — Progress Notes (Signed)
 Virtual Visit via Video Note  I connected with Rebecca [name] on 07/12/23 at  1:40 PM EDT by a video enabled telemedicine application and verified that I am speaking with the correct person using two identifiers.  Location: Patient: home Provider: office   I discussed the limitations of evaluation and management by telemedicine and the availability of in person appointments. The patient expressed understanding and agreed to proceed.     I discussed the assessment and treatment plan with the patient. The patient was provided an opportunity to ask questions and all were answered. The patient agreed with the plan and demonstrated an understanding of the instructions.   The patient was advised to call back or seek an in-person evaluation if the symptoms worsen or if the condition fails to improve as anticipated.  I provided 20 minutes of non-face-to-face time during this encounter.   Diannia Ruder, MD  [facility] MD/PA/NP OP Progress Note  07/12/2023 2:04 PM Rebecca [name]  MRN:  [id number]  Chief Complaint:  Chief Complaint  Patient presents with   Depression   Anxiety   Follow-up   HPI: This patient is a 73 year old married white female who lives with her husband in [place].  She is a retired Lawyer.  The patient returns for follow-up after 6 months regarding her depression and anxiety.  She states that she has been doing well and her mood has been good and she denies significant anxiety symptoms.  She is sleeping well.  She denies thoughts of self-harm or suicide.  She recently has been recovering from a urinary tract infection and still has back pain at times.  Other than that her health is been good Visit Diagnosis:    ICD-10-CM   1. Major depressive disorder, recurrent episode, moderate (HCC)  F33.1       Past Psychiatric History: 2 past psychiatric hospitalizations for depression  Past Medical History:  Past Medical History:  Diagnosis Date   Allergy    Amenia    Anxiety     Arthritis    Chronic back pain    Chronic kidney disease    cyst on bil kidneys   Colitis, ischemic (HCC)    Colon polyp    Complication of anesthesia    COPD (chronic obstructive pulmonary disease) (HCC)    Coronary artery disease    Depression    Dysphagia    Fibromyalgia    GERD (gastroesophageal reflux disease)    Hiatal hernia    Hyperlipidemia    Hypertension    IBS (irritable bowel syndrome)    Myocardial infarction (HCC)    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 Colitis   SVT (supraventricular tachycardia) (HCC)    Tinnitus    UTI (urinary tract infection)     Past Surgical History:  Procedure Laterality Date   ABDOMINAL HYSTERECTOMY     BACK SURGERY     BLADDER SURGERY     BREAST SURGERY     carple tunnal  surgery     Right hand   CHOLECYSTECTOMY N/A 03/26/2018   Procedure: LAPAROSCOPIC CHOLECYSTECTOMY WITH ATTEMPTED INTRAOPERATIVE CHOLANGIOGRAM;  Surgeon: Manus Rudd, MD;  Location: [facility] OR;  Service: General;  Laterality: N/A;   COLONOSCOPY  09/10/2005   in [place]  DILATION AND CURETTAGE OF UTERUS     NECK SURGERY     cyst on neck   pillonidal cyst     resection   TUBAL LIGATION     UPPER GI ENDOSCOPY  05/05/2011    Family Psychiatric History: See below  Family History:  Family History  Problem Relation Age of Onset   Alcohol abuse Father    Depression Father    Cancer Father        lung   Cancer Paternal Grandfather        lung   Osteoporosis Maternal Grandmother    Kyphosis Maternal Grandmother    Stroke Maternal Grandfather    Osteoporosis Mother    Kyphosis Mother    Stroke Mother    Heart disease Mother    Kidney disease Mother    Sleep apnea Mother        said used oxygen at night    Arthritis Brother    Arthritis Brother    Scoliosis Daughter     Scoliosis Son    Colon cancer Maternal Uncle    Diabetes Maternal Uncle    Irritable bowel syndrome Maternal Uncle    Esophageal cancer Neg Hx    Stomach cancer Neg Hx     Social History:  Social History   Socioeconomic History   Marital status: Married    Spouse name: Not on file   Number of children: 2   Years of education: Not on file   Highest education level: Not on file  Occupational History   Occupation: CNA  Tobacco Use   Smoking status: Every Day    Current packs/day: 0.25    Average packs/day: 0.3 packs/day for 44.0 years (11.0 ttl pk-yrs)    Types: Cigarettes   Smokeless tobacco: Never   Tobacco comments:    smokes 4-6 cig daily  Vaping Use   Vaping status: Never Used  Substance and Sexual Activity   Alcohol use: No    Alcohol/week: 0.0 standard drinks of alcohol   Drug use: No   Sexual activity: Yes    Birth control/protection: Surgical    Comment: hyst  Other Topics Concern   Not on file  Social History Narrative   Not on file   Social Drivers of Health   Financial Resource Strain: Low Risk  (06/10/2023)   Received [facility]   Overall Financial Resource Strain (CARDIA)    Difficulty of Paying Living Expenses: Not hard at all  Food Insecurity: No Food Insecurity (06/10/2023)   Received from [facility]   Hunger Vital Sign    Worried About Running Out of Food in the Last Year: Never true    Ran Out of Food in the Last Year: Never true  Transportation Needs: No Transportation Needs (06/10/2023)   Received from [facility] - Transportation    Lack of Transportation (Medical): No    Lack of Transportation (Non-Medical): No  Physical Activity: Sufficiently Active (06/10/2023)   Received from [facility]   Exercise Vital Sign    Days of Exercise per Week: 3 days    Minutes of Exercise per Session: 120 min  Stress: Stress Concern Present (06/10/2023)   Received from [facility] -  Occupational Stress Questionnaire    Feeling of Stress : To some extent  Social Connections: Patient Declined (06/10/2023)   Received from [facility]   Social Network    How would you rate your  social network (family, work, friends)?: Patient declined    Allergies:  Allergies  Allergen Reactions   Bupropion Hcl Swelling   Doxycycline Other (See Comments)    When  she is on Valium    Simvastatin Other (See Comments)    Sleepy and weak   Toradol [Ketorolac Tromethamine] Other (See Comments)    Causes gi bleeding     Cephalexin Other (See Comments)    Burning with urination   Darvocet [Propoxyphene N-Acetaminophen] Anxiety   Penicillins Swelling and Rash    Has patient had a PCN reaction causing immediate rash, facial/tongue/throat swelling, SOB or lightheadedness with hypotension: No Has patient had a PCN reaction causing severe rash involving mucus membranes or skin necrosis: No Has patient had a PCN reaction that required hospitalization: No Has patient had a PCN reaction occurring within the last 10 years: No If all of the above answers are "NO", then may proceed with Cephalosporin use.     Metabolic Disorder Labs: No results found for: "HGBA1C", "MPG" No results found for: "PROLACTIN" Lab Results  Component Value Date   CHOL 235 (H) 08/28/2008   TRIG 147 08/28/2008   HDL 52 08/28/2008   CHOLHDL 4.5 Ratio 08/28/2008   VLDL 29 08/28/2008   LDLCALC 154 (H) 08/28/2008   Lab Results  Component Value Date   TSH 1.511 08/28/2008   TSH 0.802 08/16/2007    Therapeutic Level Labs: No results found for: "LITHIUM" No results found for: "VALPROATE" No results found for: "CBMZ"  Current Medications: Current Outpatient Medications  Medication Sig Dispense Refill   acetaminophen (TYLENOL) 500 MG tablet Take 500 mg by mouth every 6 (six) hours as needed for moderate pain.     albuterol (PROVENTIL) (2.5 MG/3ML) 0.083% nebulizer solution Take 2.5 mg by nebulization every 6  (six) hours as needed for wheezing or shortness of breath.     albuterol (VENTOLIN HFA) 108 (90 Base) MCG/ACT inhaler Inhale 2 puffs into the lungs every 4 (four) hours as needed. 18 g 0   ALPRAZolam (XANAX) 0.5 MG tablet Take 1 tablet (0.5 mg total) by mouth 4 (four) times daily as needed for anxiety. 120 tablet 5   amLODipine (NORVASC) 2.5 MG tablet Take by mouth.     azithromycin (ZITHROMAX) 250 MG tablet Take first 2 tablets together, then 1 every day until finished. 6 tablet 0   cetirizine (ZYRTEC ALLERGY) 10 MG tablet Take 1 tablet (10 mg total) by mouth daily. 30 tablet 2   conjugated estrogens (PREMARIN) vaginal cream Place 1 Applicatorful vaginally daily.      Cyanocobalamin (VITAMIN B-12 IJ) Inject 1,000 mcg as directed every 30 (thirty) days.      diclofenac Sodium (VOLTAREN) 1 % GEL PLACE ONTO THE SKIN 4 (FOUR) TIMES A DAY AS NEEDED. APPLY 4G TO SITE AS DIRECTED     doxycycline (VIBRAMYCIN) 100 MG capsule Take 1 capsule (100 mg total) by mouth 2 (two) times daily. 20 capsule 0   fluticasone (FLONASE) 50 MCG/ACT nasal spray Place 1 spray into both nostrils daily.      fluticasone (FLONASE) 50 MCG/ACT nasal spray Place 1 spray into both nostrils 2 (two) times daily. 16 g 2   metoCLOPramide (REGLAN) 5 MG tablet Take 1 tablet (5 mg total) by mouth every 8 (eight) hours as needed for nausea. 20 tablet 0   montelukast (SINGULAIR) 10 MG tablet Take 10 mg by mouth daily as needed (allergies).      nicotine (NICODERM CQ - DOSED IN MG/24  HR) 7 mg/24hr patch Place 7 mg onto the skin daily.     predniSONE (DELTASONE) 20 MG tablet Take 2 tablets (40 mg total) by mouth daily with breakfast. 10 tablet 0   promethazine-dextromethorphan (PROMETHAZINE-DM) 6.25-15 MG/5ML syrup Take 5 mLs by mouth 4 (four) times daily as needed. 100 mL 0   ranitidine (ZANTAC) 150 MG tablet Take 150 mg by mouth at bedtime.  5   sertraline (ZOLOFT) 50 MG tablet Take 1 tablet (50 mg total) by mouth 2 (two) times daily. 180  tablet 2   sodium chloride (OCEAN) 0.65 % SOLN nasal spray Place 1 spray into both nostrils as needed for congestion.     No current facility-administered medications for this visit.     Musculoskeletal: Strength & Muscle Tone: within normal limits Gait & Station: normal Patient leans: N/A  Psychiatric Specialty Exam: Review of Systems  Musculoskeletal:  Positive for back pain.  All other systems reviewed and are negative.   There were no vitals taken for this visit.There is no height or weight on file to calculate BMI.  General Appearance: Casual and Fairly Groomed  Eye Contact:  Good  Speech:  Clear and Coherent  Volume:  Normal  Mood:  Euthymic  Affect:  Congruent  Thought Process:  Goal Directed  Orientation:  Full (Time, Place, and Person)  Thought Content: WDL   Suicidal Thoughts:  No  Homicidal Thoughts:  No  Memory:  Immediate;   Good Recent;   Good Remote;   Fair  Judgement:  Good  Insight:  Good  Psychomotor Activity:  Normal  Concentration:  Concentration: Good and Attention Span: Good  Recall:  Good  Fund of Knowledge: Good  Language: Good  Akathisia:  No  Handed:  Right  AIMS (if indicated): not done  Assets:  Communication Skills Desire for Improvement Resilience Social Support Talents/Skills  ADL's:  Intact  Cognition: WNL  Sleep:  Good   Screenings: PHQ2-9    Flowsheet Row Video Visit from 01/11/2022 in [facility] [place] [place] Video Visit from 09/14/2021 in [facility] at [place] Video Visit from 05/18/2021 in [facility] at [place] Video Visit from 01/05/2021 in [facility] at [place] Video Visit from 08/24/2020 in [facility] Total Score 0 0 0 0 0      Flowsheet Row ED from 04/07/2023 in [facility] Video Visit from 01/11/2022 in [facility] at [place] ED from 11/15/2021 in [facility] Emergency Department at [facility]  C-SSRS RISK CATEGORY No Risk No Risk No Risk        Assessment and Plan: This patient is a 73 year old female with a history of depression and anxiety.  She continues to do well on her current regimen.  She will continue Zoloft 50 mg twice daily for depression and anxiety and Xanax 0.5 mg 4 times daily as needed for anxiety.  She will return to see me in 6 months.  Collaboration of Care: Collaboration of Care: Primary Care Provider AEB notes are shared with PCP on the epic system  Patient/Guardian was advised Release of Information must be obtained prior to any record release in order to collaborate their care with an outside provider. Patient/Guardian was advised if they have not already done so to contact the registration department to sign all necessary forms in order for [place] to release information  regarding their care.   Consent: Patient/Guardian gives verbal consent for treatment and assignment of benefits for services provided during this visit. Patient/Guardian expressed understanding and agreed to proceed.    Diannia Ruder, MD 07/12/2023, 2:04 PM

## 2024-01-29 ENCOUNTER — Ambulatory Visit (HOSPITAL_COMMUNITY): Admitting: Psychiatry

## 2024-01-29 ENCOUNTER — Encounter (HOSPITAL_COMMUNITY): Payer: Self-pay | Admitting: Psychiatry

## 2024-01-29 VITALS — BP 125/73 | HR 67 | Ht 65.0 in | Wt 134.8 lb

## 2024-01-29 DIAGNOSIS — F411 Generalized anxiety disorder: Secondary | ICD-10-CM | POA: Diagnosis not present

## 2024-01-29 DIAGNOSIS — F331 Major depressive disorder, recurrent, moderate: Secondary | ICD-10-CM

## 2024-01-29 MED ORDER — SERTRALINE HCL 50 MG PO TABS: 50.0000 mg | ORAL_TABLET | Freq: Two times a day (BID) | ORAL | 2 refills | Status: AC

## 2024-01-29 MED ORDER — ALPRAZOLAM 0.5 MG PO TABS: 0.5000 mg | ORAL_TABLET | Freq: Three times a day (TID) | ORAL | 5 refills | Status: AC | PRN

## 2024-01-29 NOTE — Progress Notes (Signed)
 [facility] MD/PA/NP OP Progress Note  01/29/2024 3:19 PM Rebecca [name]  MRN:  [id number]  Chief Complaint:  Chief Complaint  Patient presents with   Anxiety   Depression   Follow-up   HPI: This patient is a 73 year old married white female lives with her husband in [place].  She is a retired Lawyer.  The patient returns in person after 6 months regarding her major depression and generalized anxiety disorder.  She states overall she is doing okay in terms of her mental status.  She denies being depressed or having anxiety or panic.  In fact I suggested we cut down her Xanax  a bit to 3 times a day and she agrees.  She is generally sleeping well.  She is still dealing with chronic diarrhea.  She did not have any response from Linzess but is using Lomotil with fairly good result.  She also was found to have some protein in her urine but other than that and chronic back pain her health has been good. Visit Diagnosis:    ICD-10-CM   1. Major depressive disorder, recurrent episode, moderate (HCC)  F33.1       Past Psychiatric History: 2 past psychiatric hospitalizations for depression  Past Medical History:  Past Medical History:  Diagnosis Date   Allergy    Amenia    Anxiety    Arthritis    Chronic back pain    Chronic kidney disease    cyst on bil kidneys   Colitis, ischemic    Colon polyp    Complication of anesthesia    COPD (chronic obstructive pulmonary disease) (HCC)    Coronary artery disease    Depression    Dysphagia    Fibromyalgia    GERD (gastroesophageal reflux disease)    Hiatal hernia    Hyperlipidemia    Hypertension    IBS (irritable bowel syndrome)    Myocardial infarction (HCC)    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 Colitis   SVT (supraventricular tachycardia)    Tinnitus     UTI (urinary tract infection)     Past Surgical History:  Procedure Laterality Date   ABDOMINAL HYSTERECTOMY     BACK SURGERY     BLADDER SURGERY     BREAST SURGERY     carple tunnal  surgery     Right hand   CHOLECYSTECTOMY N/A 03/26/2018   Procedure: LAPAROSCOPIC CHOLECYSTECTOMY WITH ATTEMPTED INTRAOPERATIVE CHOLANGIOGRAM;  Surgeon: Belinda Cough, MD;  Location: [facility] OR;  Service: General;  Laterality: N/A;   COLONOSCOPY  09/10/2005   in [place]   DILATION AND CURETTAGE OF UTERUS     NECK SURGERY     cyst on neck   pillonidal cyst     resection   TUBAL LIGATION     UPPER GI ENDOSCOPY  05/05/2011    Family Psychiatric History: See below  Family History:  Family History  Problem Relation Age of Onset   Alcohol abuse Father    Depression Father    Cancer Father        lung   Cancer Paternal Grandfather        lung   Osteoporosis Maternal Grandmother    Kyphosis Maternal Grandmother    Stroke Maternal Grandfather  Osteoporosis Mother    Kyphosis Mother    Stroke Mother    Heart disease Mother    Kidney disease Mother    Sleep apnea Mother        said used oxygen at night    Arthritis Brother    Arthritis Brother    Scoliosis Daughter    Scoliosis Son    Colon cancer Maternal Uncle    Diabetes Maternal Uncle    Irritable bowel syndrome Maternal Uncle    Esophageal cancer Neg Hx    Stomach cancer Neg Hx     Social History:  Social History   Socioeconomic History   Marital status: Married    Spouse name: Not on file   Number of children: 2   Years of education: Not on file   Highest education level: Not on file  Occupational History   Occupation: CNA  Tobacco Use   Smoking status: Every Day    Current packs/day: 0.25    Average packs/day: 0.3 packs/day for 44.0 years (11.0 ttl pk-yrs)    Types: Cigarettes   Smokeless tobacco: Never   Tobacco comments:    smokes 4-6 cig daily  Vaping Use   Vaping status: Never Used  Substance and Sexual Activity    Alcohol use: No    Alcohol/week: 0.0 standard drinks of alcohol   Drug use: No   Sexual activity: Yes    Birth control/protection: Surgical    Comment: hyst  Other Topics Concern   Not on file  Social History Narrative   Not on file   Social Drivers of Health   Financial Resource Strain: Low Risk  (06/10/2023)   Received from [facility]   Overall Financial Resource Strain (CARDIA)    Difficulty of Paying Living Expenses: Not hard at all  Food Insecurity: No Food Insecurity (06/10/2023)   Received from [facility]   Hunger Vital Sign    Within the past 12 months, you worried that your food would run out before you got the money to buy more.: Never true    Within the past 12 months, the food you bought just didn't last and you didn't have money to get more.: Never true  Transportation Needs: No Transportation Needs (06/10/2023)   Received from [facility] - Transportation    Lack of Transportation (Medical): No    Lack of Transportation (Non-Medical): No  Physical Activity: Sufficiently Active (06/10/2023)   Received [facility]   Exercise Vital Sign    On average, how many days per week do you engage in moderate to strenuous exercise (like a brisk walk)?: 3 days    On average, how many minutes do you engage in exercise at this level?: 120 min  Stress: Stress Concern Present (06/10/2023)   Received from [facility] - Occupational Stress Questionnaire    Feeling of Stress : To some extent  Social Connections: Patient Declined (06/10/2023)   Received from [facility]   Social Network    How would you rate your social network (family, work, friends)?: Patient declined    Allergies:  Allergies  Allergen Reactions   Bupropion Hcl Swelling   Doxycycline  Other (See Comments)    When  she is on Valium     Simvastatin Other (See Comments)    Sleepy and weak   Toradol  [Ketorolac  Tromethamine ] Other (See Comments)     Causes gi bleeding     Cephalexin Other (See Comments)  Burning with urination   Darvocet [Propoxyphene N-Acetaminophen ] Anxiety   Penicillins Swelling and Rash    Has patient had a PCN reaction causing immediate rash, facial/tongue/throat swelling, SOB or lightheadedness with hypotension: No Has patient had a PCN reaction causing severe rash involving mucus membranes or skin necrosis: No Has patient had a PCN reaction that required hospitalization: No Has patient had a PCN reaction occurring within the last 10 years: No If all of the above answers are NO, then may proceed with Cephalosporin use.     Metabolic Disorder Labs: No results found for: HGBA1C, MPG No results found for: PROLACTIN Lab Results  Component Value Date   CHOL 235 (H) 08/28/2008   TRIG 147 08/28/2008   HDL 52 08/28/2008   CHOLHDL 4.5 Ratio 08/28/2008   VLDL 29 08/28/2008   LDLCALC 154 (H) 08/28/2008   Lab Results  Component Value Date   TSH 1.511 08/28/2008   TSH 0.802 08/16/2007    Therapeutic Level Labs: No results found for: LITHIUM No results found for: VALPROATE No results found for: CBMZ  Current Medications: Current Outpatient Medications  Medication Sig Dispense Refill   acetaminophen  (TYLENOL ) 500 MG tablet Take 500 mg by mouth every 6 (six) hours as needed for moderate pain.     albuterol  (PROVENTIL ) (2.5 MG/3ML) 0.083% nebulizer solution Take 2.5 mg by nebulization every 6 (six) hours as needed for wheezing or shortness of breath.     albuterol  (VENTOLIN  HFA) 108 (90 Base) MCG/ACT inhaler Inhale 2 puffs into the lungs every 4 (four) hours as needed. 18 g 0   amLODipine (NORVASC) 2.5 MG tablet Take by mouth.     azithromycin  (ZITHROMAX ) 250 MG tablet Take first 2 tablets together, then 1 every day until finished. 6 tablet 0   cetirizine  (ZYRTEC  ALLERGY) 10 MG tablet Take 1 tablet (10 mg total) by mouth daily. 30 tablet 2   conjugated estrogens (PREMARIN) vaginal cream Place 1  Applicatorful vaginally daily.      Cyanocobalamin (VITAMIN B-12 IJ) Inject 1,000 mcg as directed every 30 (thirty) days.      diclofenac Sodium (VOLTAREN) 1 % GEL PLACE ONTO THE SKIN 4 (FOUR) TIMES A DAY AS NEEDED. APPLY 4G TO SITE AS DIRECTED     doxycycline  (VIBRAMYCIN ) 100 MG capsule Take 1 capsule (100 mg total) by mouth 2 (two) times daily. 20 capsule 0   fluticasone  (FLONASE ) 50 MCG/ACT nasal spray Place 1 spray into both nostrils daily.      fluticasone  (FLONASE ) 50 MCG/ACT nasal spray Place 1 spray into both nostrils 2 (two) times daily. 16 g 2   metoCLOPramide  (REGLAN ) 5 MG tablet Take 1 tablet (5 mg total) by mouth every 8 (eight) hours as needed for nausea. 20 tablet 0   montelukast (SINGULAIR) 10 MG tablet Take 10 mg by mouth daily as needed (allergies).      nicotine (NICODERM CQ - DOSED IN MG/24 HR) 7 mg/24hr patch Place 7 mg onto the skin daily.     predniSONE  (DELTASONE ) 20 MG tablet Take 2 tablets (40 mg total) by mouth daily with breakfast. 10 tablet 0   promethazine -dextromethorphan (PROMETHAZINE -DM) 6.25-15 MG/5ML syrup Take 5 mLs by mouth 4 (four) times daily as needed. 100 mL 0   ranitidine (ZANTAC) 150 MG tablet Take 150 mg by mouth at bedtime.  5   sodium chloride  (OCEAN) 0.65 % SOLN nasal spray Place 1 spray into both nostrils as needed for congestion.     ALPRAZolam  (XANAX ) 0.5 MG tablet  Take 1 tablet (0.5 mg total) by mouth 3 (three) times daily as needed for anxiety. 90 tablet 5   sertraline  (ZOLOFT ) 50 MG tablet Take 1 tablet (50 mg total) by mouth 2 (two) times daily. 180 tablet 2   No current facility-administered medications for this visit.     Musculoskeletal: Strength & Muscle Tone: within normal limits Gait & Station: normal Patient leans: N/A  Psychiatric Specialty Exam: Review of Systems  Gastrointestinal:  Positive for diarrhea.  Musculoskeletal:  Positive for back pain.  All other systems reviewed and are negative.   Blood pressure 125/73, pulse  67, height 5' 5 (1.651 m), weight 134 lb 12.8 oz (61.1 kg), SpO2 94%.Body mass index is 22.43 kg/m.  General Appearance: Casual and Fairly Groomed  Eye Contact:  Good  Speech:  Clear and Coherent  Volume:  Normal  Mood:  Euthymic  Affect:  Congruent  Thought Process:  Goal Directed  Orientation:  Full (Time, Place, and Person)  Thought Content: WDL   Suicidal Thoughts:  No  Homicidal Thoughts:  No  Memory:  Immediate;   Good Recent;   Good Remote;   NA  Judgement:  Good  Insight:  Fair  Psychomotor Activity:  Decreased  Concentration:  Concentration: Good and Attention Span: Good  Recall:  Good  Fund of Knowledge: Good  Language: Good  Akathisia:  No  Handed:  Right  AIMS (if indicated): not done  Assets:  Communication Skills Desire for Improvement Resilience Social Support Talents/Skills  ADL's:  Intact  Cognition: WNL  Sleep:  Good   Screenings: PHQ2-9    Flowsheet Row Video Visit from 01/11/2022 in [facility] [place] Video Visit from 09/14/2021 in [facility] at [place] Video Visit from 05/18/2021 in [facility] at [place] Video Visit from 01/05/2021 in [facility] at [place] Video Visit from 08/24/2020 in [facility] at [facility] Total Score 0 0 0 0 0   Flowsheet Row UC from 04/07/2023 in [facility] Video Visit from 01/11/2022 in [facility] at [place] ED from 11/15/2021 in [facility] at [facility]  C-SSRS RISK CATEGORY No Risk No Risk No Risk     Assessment and Plan:  This patient is a 73 year old female with a history of major depression and generalized anxiety disorder.  She continues to do well on her current regimen.  She will continue Zoloft  50 mg twice daily for depression and anxiety.  We will cut her Xanax  down  to 0.5 mg 3 times daily as needed for anxiety.  She will return to see me in 6 months Collaboration of Care: Collaboration of Care: Primary Care Provider AEB notes will be shared with PCP at patient's request  Patient/Guardian was advised Release of Information must be obtained prior to any record release in order to collaborate their care with an outside provider. Patient/Guardian was advised if they have not already done so to contact the registration department to sign all necessary forms in order for us  to release information regarding their care.   Consent: Patient/Guardian gives verbal consent for treatment and assignment of benefits for services provided during this visit. Patient/Guardian expressed understanding and agreed to proceed.    Barnie Gull, MD 01/29/2024, 3:19 PM

## 2024-07-29 ENCOUNTER — Telehealth (HOSPITAL_COMMUNITY): Admitting: Psychiatry
